# Patient Record
Sex: Male | Born: 1935 | Race: White | Hispanic: No | Marital: Married | Smoking: Former smoker
Health system: Southern US, Community
[De-identification: ages and names within clinical notes are randomized; demographics above are authoritative.]

## PROBLEM LIST (undated history)

## (undated) DIAGNOSIS — M1990 Unspecified osteoarthritis, unspecified site: Secondary | ICD-10-CM

## (undated) DIAGNOSIS — Z9289 Personal history of other medical treatment: Secondary | ICD-10-CM

## (undated) DIAGNOSIS — E7800 Pure hypercholesterolemia, unspecified: Secondary | ICD-10-CM

## (undated) DIAGNOSIS — Z8739 Personal history of other diseases of the musculoskeletal system and connective tissue: Secondary | ICD-10-CM

## (undated) DIAGNOSIS — R001 Bradycardia, unspecified: Secondary | ICD-10-CM

## (undated) DIAGNOSIS — D649 Anemia, unspecified: Secondary | ICD-10-CM

## (undated) DIAGNOSIS — N059 Unspecified nephritic syndrome with unspecified morphologic changes: Secondary | ICD-10-CM

## (undated) DIAGNOSIS — L03119 Cellulitis of unspecified part of limb: Secondary | ICD-10-CM

## (undated) DIAGNOSIS — IMO0002 Reserved for concepts with insufficient information to code with codable children: Secondary | ICD-10-CM

## (undated) DIAGNOSIS — H3530 Unspecified macular degeneration: Secondary | ICD-10-CM

## (undated) DIAGNOSIS — I495 Sick sinus syndrome: Secondary | ICD-10-CM

## (undated) DIAGNOSIS — K219 Gastro-esophageal reflux disease without esophagitis: Secondary | ICD-10-CM

## (undated) DIAGNOSIS — E039 Hypothyroidism, unspecified: Secondary | ICD-10-CM

## (undated) DIAGNOSIS — J189 Pneumonia, unspecified organism: Secondary | ICD-10-CM

## (undated) DIAGNOSIS — K649 Unspecified hemorrhoids: Secondary | ICD-10-CM

## (undated) DIAGNOSIS — I10 Essential (primary) hypertension: Secondary | ICD-10-CM

## (undated) DIAGNOSIS — N200 Calculus of kidney: Secondary | ICD-10-CM

## (undated) DIAGNOSIS — R011 Cardiac murmur, unspecified: Secondary | ICD-10-CM

## (undated) DIAGNOSIS — L02419 Cutaneous abscess of limb, unspecified: Secondary | ICD-10-CM

## (undated) DIAGNOSIS — I499 Cardiac arrhythmia, unspecified: Secondary | ICD-10-CM

## (undated) DIAGNOSIS — Z950 Presence of cardiac pacemaker: Secondary | ICD-10-CM

## (undated) DIAGNOSIS — I509 Heart failure, unspecified: Secondary | ICD-10-CM

## (undated) DIAGNOSIS — I639 Cerebral infarction, unspecified: Secondary | ICD-10-CM

## (undated) DIAGNOSIS — M549 Dorsalgia, unspecified: Secondary | ICD-10-CM

## (undated) HISTORY — DX: Unspecified nephritic syndrome with unspecified morphologic changes: N05.9

## (undated) HISTORY — DX: Sick sinus syndrome: I49.5

## (undated) HISTORY — DX: Reserved for concepts with insufficient information to code with codable children: IMO0002

## (undated) HISTORY — DX: Unspecified hemorrhoids: K64.9

## (undated) HISTORY — PX: REPLACEMENT TOTAL KNEE: SUR1224

## (undated) HISTORY — DX: Bradycardia, unspecified: R00.1

## (undated) HISTORY — PX: CATARACT EXTRACTION W/ INTRAOCULAR LENS  IMPLANT, BILATERAL: SHX1307

## (undated) HISTORY — PX: KIDNEY STONE SURGERY: SHX686

## (undated) HISTORY — PX: INSERT / REPLACE / REMOVE PACEMAKER: SUR710

## (undated) HISTORY — PX: JOINT REPLACEMENT: SHX530

## (undated) HISTORY — PX: CYSTOSCOPY W/ LITHOLAPAXY / EHL: SUR377

## (undated) HISTORY — PX: BACK SURGERY: SHX140

## (undated) HISTORY — PX: TONSILLECTOMY: SUR1361

## (undated) HISTORY — PX: APPENDECTOMY: SHX54

---

## 1998-08-03 ENCOUNTER — Encounter: Payer: Self-pay | Admitting: Family Medicine

## 1998-08-03 ENCOUNTER — Ambulatory Visit (HOSPITAL_COMMUNITY): Admission: RE | Admit: 1998-08-03 | Discharge: 1998-08-03 | Payer: Self-pay | Admitting: Family Medicine

## 1998-08-20 ENCOUNTER — Ambulatory Visit (HOSPITAL_COMMUNITY): Admission: RE | Admit: 1998-08-20 | Discharge: 1998-08-20 | Payer: Self-pay | Admitting: Family Medicine

## 2000-10-18 ENCOUNTER — Ambulatory Visit (HOSPITAL_COMMUNITY): Admission: RE | Admit: 2000-10-18 | Discharge: 2000-10-18 | Payer: Self-pay | Admitting: Family Medicine

## 2000-10-18 ENCOUNTER — Encounter: Payer: Self-pay | Admitting: Family Medicine

## 2000-11-06 ENCOUNTER — Ambulatory Visit (HOSPITAL_COMMUNITY): Admission: RE | Admit: 2000-11-06 | Discharge: 2000-11-06 | Payer: Self-pay | Admitting: Family Medicine

## 2000-11-06 ENCOUNTER — Encounter: Payer: Self-pay | Admitting: Family Medicine

## 2001-03-29 ENCOUNTER — Encounter: Payer: Self-pay | Admitting: Urology

## 2001-03-29 ENCOUNTER — Ambulatory Visit (HOSPITAL_BASED_OUTPATIENT_CLINIC_OR_DEPARTMENT_OTHER): Admission: RE | Admit: 2001-03-29 | Discharge: 2001-03-29 | Payer: Self-pay | Admitting: Urology

## 2001-04-18 ENCOUNTER — Encounter: Payer: Self-pay | Admitting: Family Medicine

## 2001-04-18 ENCOUNTER — Ambulatory Visit (HOSPITAL_COMMUNITY): Admission: RE | Admit: 2001-04-18 | Discharge: 2001-04-18 | Payer: Self-pay | Admitting: Family Medicine

## 2002-04-08 ENCOUNTER — Ambulatory Visit (HOSPITAL_COMMUNITY): Admission: RE | Admit: 2002-04-08 | Discharge: 2002-04-08 | Payer: Self-pay | Admitting: Gastroenterology

## 2011-10-01 ENCOUNTER — Encounter (HOSPITAL_COMMUNITY): Payer: Self-pay | Admitting: *Deleted

## 2011-10-01 ENCOUNTER — Inpatient Hospital Stay (HOSPITAL_COMMUNITY)
Admission: EM | Admit: 2011-10-01 | Discharge: 2011-10-05 | DRG: 243 | Disposition: A | Discharge status: Home / Self Care | Payer: Medicare Other | Attending: Internal Medicine | Admitting: Internal Medicine

## 2011-10-01 DIAGNOSIS — I10 Essential (primary) hypertension: Secondary | ICD-10-CM

## 2011-10-01 DIAGNOSIS — I498 Other specified cardiac arrhythmias: Secondary | ICD-10-CM | POA: Diagnosis present

## 2011-10-01 DIAGNOSIS — E032 Hypothyroidism due to medicaments and other exogenous substances: Secondary | ICD-10-CM

## 2011-10-01 DIAGNOSIS — I350 Nonrheumatic aortic (valve) stenosis: Secondary | ICD-10-CM

## 2011-10-01 DIAGNOSIS — I359 Nonrheumatic aortic valve disorder, unspecified: Secondary | ICD-10-CM | POA: Diagnosis present

## 2011-10-01 DIAGNOSIS — I442 Atrioventricular block, complete: Principal | ICD-10-CM

## 2011-10-01 DIAGNOSIS — N179 Acute kidney failure, unspecified: Secondary | ICD-10-CM

## 2011-10-01 DIAGNOSIS — R55 Syncope and collapse: Secondary | ICD-10-CM

## 2011-10-01 DIAGNOSIS — D649 Anemia, unspecified: Secondary | ICD-10-CM

## 2011-10-01 DIAGNOSIS — Z7982 Long term (current) use of aspirin: Secondary | ICD-10-CM

## 2011-10-01 DIAGNOSIS — E039 Hypothyroidism, unspecified: Secondary | ICD-10-CM | POA: Diagnosis present

## 2011-10-01 HISTORY — DX: Dorsalgia, unspecified: M54.9

## 2011-10-01 HISTORY — DX: Pure hypercholesterolemia, unspecified: E78.00

## 2011-10-01 HISTORY — DX: Calculus of kidney: N20.0

## 2011-10-01 HISTORY — DX: Hypothyroidism, unspecified: E03.9

## 2011-10-01 HISTORY — DX: Essential (primary) hypertension: I10

## 2011-10-01 LAB — CREATININE, SERUM
Creatinine, Ser: 1.51 mg/dL — ABNORMAL HIGH (ref 0.50–1.35)
GFR calc Af Amer: 44 mL/min — ABNORMAL LOW (ref >90)
GFR calc non Af Amer: 38 mL/min — ABNORMAL LOW (ref >90)

## 2011-10-01 LAB — TSH: TSH: 2.164 u[IU]/mL (ref 0.350–4.500)

## 2011-10-01 LAB — CARDIAC PANEL(CRET KIN+CKTOT+MB+TROPI)
CK, MB: 3.7 ng/mL (ref 0.3–4.0)
CK, MB: 3 ng/mL (ref 0.3–4.0)
Troponin I: <0.3 ng/mL (ref <0.30)

## 2011-10-01 LAB — MAGNESIUM: Magnesium: 2.3 mg/dL (ref 1.5–2.5)

## 2011-10-01 LAB — POCT I-STAT, CHEM 8
Creatinine, Ser: 1.6 mg/dL — ABNORMAL HIGH (ref 0.50–1.35)
Glucose, Bld: 124 mg/dL — ABNORMAL HIGH (ref 70–99)
Hemoglobin: 11.9 g/dL — ABNORMAL LOW (ref 13.0–17.0)
Potassium: 4.8 mEq/L (ref 3.5–5.1)

## 2011-10-01 LAB — CBC
HCT: 33.1 % — ABNORMAL LOW (ref 39.0–52.0)
Hemoglobin: 11.1 g/dL — ABNORMAL LOW (ref 13.0–17.0)
MCHC: 33.5 g/dL (ref 30.0–36.0)

## 2011-10-01 MED ORDER — VITAMIN C 250 MG PO TABS
250.0000 mg | ORAL_TABLET | Freq: Every day | ORAL | Status: DC
  Administered 2011-10-02 – 2011-10-05 (×4): 250 mg via ORAL
  Filled 2011-10-01 (×4): qty 1

## 2011-10-01 MED ORDER — ACETAMINOPHEN 650 MG RE SUPP: 650.0000 mg | Freq: Four times a day (QID) | RECTAL | Status: DC | PRN

## 2011-10-01 MED ORDER — ONDANSETRON HCL 4 MG/2ML IJ SOLN: 4.0000 mg | Freq: Four times a day (QID) | INTRAMUSCULAR | Status: DC | PRN

## 2011-10-01 MED ORDER — ACETAMINOPHEN 325 MG PO TABS: 650.0000 mg | ORAL_TABLET | Freq: Four times a day (QID) | ORAL | Status: DC | PRN

## 2011-10-01 MED ORDER — THERA M PLUS PO TABS: 1.0000 | ORAL_TABLET | Freq: Every day | ORAL | Status: DC

## 2011-10-01 MED ORDER — ASPIRIN EC 81 MG PO TBEC
81.0000 mg | DELAYED_RELEASE_TABLET | Freq: Every day | ORAL | Status: DC
  Administered 2011-10-02 – 2011-10-05 (×4): 81 mg via ORAL
  Filled 2011-10-01 (×4): qty 1

## 2011-10-01 MED ORDER — SODIUM CHLORIDE 0.9 % IJ SOLN
3.0000 mL | Freq: Two times a day (BID) | INTRAMUSCULAR | Status: DC
  Administered 2011-10-01 – 2011-10-02 (×2): 3 mL via INTRAVENOUS

## 2011-10-01 MED ORDER — SODIUM CHLORIDE 0.9 % IJ SOLN
3.0000 mL | Freq: Two times a day (BID) | INTRAMUSCULAR | Status: DC
  Administered 2011-10-01 – 2011-10-05 (×7): 3 mL via INTRAVENOUS

## 2011-10-01 MED ORDER — ATROPINE SULFATE 0.1 MG/ML IJ SOLN
0.5000 mg | Freq: Once | INTRAMUSCULAR | Status: AC | PRN
  Filled 2011-10-01: qty 5

## 2011-10-01 MED ORDER — POLYETHYLENE GLYCOL 3350 17 G PO PACK
17.0000 g | PACK | Freq: Every day | ORAL | Status: DC | PRN
  Administered 2011-10-02: 17 g via ORAL
  Filled 2011-10-01 (×2): qty 1

## 2011-10-01 MED ORDER — SODIUM CHLORIDE 0.9 % IJ SOLN: 3.0000 mL | INTRAMUSCULAR | Status: DC | PRN

## 2011-10-01 MED ORDER — SIMVASTATIN 20 MG PO TABS
20.0000 mg | ORAL_TABLET | Freq: Every day | ORAL | Status: DC
  Administered 2011-10-01 – 2011-10-04 (×4): 20 mg via ORAL
  Filled 2011-10-01 (×5): qty 1

## 2011-10-01 MED ORDER — ONDANSETRON HCL 4 MG PO TABS: 4.0000 mg | ORAL_TABLET | Freq: Four times a day (QID) | ORAL | Status: DC | PRN

## 2011-10-01 MED ORDER — SODIUM CHLORIDE 0.9 % IV SOLN: 250.0000 mL | INTRAVENOUS | Status: DC | PRN

## 2011-10-01 MED ORDER — HEPARIN SODIUM (PORCINE) 5000 UNIT/ML IJ SOLN
5000.0000 [IU] | Freq: Three times a day (TID) | INTRAMUSCULAR | Status: DC
  Administered 2011-10-01 – 2011-10-03 (×5): 5000 [IU] via SUBCUTANEOUS
  Filled 2011-10-01 (×8): qty 1

## 2011-10-01 MED ORDER — ADULT MULTIVITAMIN W/MINERALS CH
1.0000 | ORAL_TABLET | Freq: Every day | ORAL | Status: DC
  Administered 2011-10-02 – 2011-10-05 (×4): 1 via ORAL
  Filled 2011-10-01 (×4): qty 1

## 2011-10-01 MED ORDER — LEVOTHYROXINE SODIUM 100 MCG PO TABS
100.0000 ug | ORAL_TABLET | Freq: Every day | ORAL | Status: DC
  Administered 2011-10-02 – 2011-10-05 (×4): 100 ug via ORAL
  Filled 2011-10-01 (×5): qty 1
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
## 2011-10-01 NOTE — Progress Notes (Signed)
10/01/11 1238  Discharge Planning  Type of Residence Private residence  Living Arrangements Spouse/significant other  Home Care Services No  Support Systems Spouse/significant other  Do you have any problems obtaining your medications? No  Family/patient expects to be discharged to: Private residence  Once you are discharged, how will you get to your follow-up appointment? Family  Case Management Consult Needed No  Social Work Consult Needed No    No urgent LCSW interventions identified. Contact unit based LCSW if disposition and/or psychosocial needs arise.    Dionne Milo MSW [facility]. Weekend/Social Worker [phone]

## 2011-10-01 NOTE — ED Notes (Signed)
Pt. States he was in the car with his wife when he "felt out of it. It felt like I had birds flying around my head".  Denies LOC. Pt. Reports vomiting twice afterward. States he feels fine now. States similar episode happened about 3 weeks ago. Pt. Had chest xray and cardiogram at the beginning of this week for heart murmur.

## 2011-10-01 NOTE — ED Notes (Signed)
MD at bedside. 
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
## 2011-10-01 NOTE — ED Notes (Addendum)
[place]in[place] RN talked with Dr. Elisha Headland from [facility] about pt.'s care. Report given to this nurse.

## 2011-10-01 NOTE — ED Notes (Signed)
Reports having syncopal episode this am, followed by n/v. HR 40 at triage, ekg done.

## 2011-10-01 NOTE — ED Notes (Signed)
Updated pt and family on plan of care.
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
## 2011-10-02 DIAGNOSIS — E032 Hypothyroidism due to medicaments and other exogenous substances: Secondary | ICD-10-CM

## 2011-10-02 DIAGNOSIS — I10 Essential (primary) hypertension: Secondary | ICD-10-CM

## 2011-10-02 DIAGNOSIS — R55 Syncope and collapse: Secondary | ICD-10-CM

## 2011-10-02 LAB — CARDIAC PANEL(CRET KIN+CKTOT+MB+TROPI)
CK, MB: 2.8 ng/mL (ref 0.3–4.0)
Relative Index: INVALID (ref 0.0–2.5)
Troponin I: <0.3 ng/mL (ref <0.30)

## 2011-10-02 LAB — COMPREHENSIVE METABOLIC PANEL
ALT: 30 U/L (ref 0–53)
AST: 28 U/L (ref 0–37)
Albumin: 3.7 g/dL (ref 3.5–5.2)
CO2: 24 mEq/L (ref 19–32)
Calcium: 9.6 mg/dL (ref 8.4–10.5)
Chloride: 106 mEq/L (ref 96–112)
Creatinine, Ser: 1.38 mg/dL — ABNORMAL HIGH (ref 0.50–1.35)
GFR calc non Af Amer: 42 mL/min — ABNORMAL LOW (ref >90)
Sodium: 141 mEq/L (ref 135–145)
Total Bilirubin: 0.8 mg/dL (ref 0.3–1.2)

## 2011-10-02 LAB — VITAMIN B12: Vitamin B-12: 658 pg/mL (ref 211–911)

## 2011-10-02 LAB — RETICULOCYTES
RBC.: 3.23 MIL/uL — ABNORMAL LOW (ref 4.22–5.81)
Retic Count, Absolute: 32.3 10*3/uL (ref 19.0–186.0)

## 2011-10-02 LAB — IRON AND TIBC: Iron: 90 ug/dL (ref 42–135)

## 2011-10-02 LAB — CBC
Hemoglobin: 11.3 g/dL — ABNORMAL LOW (ref 13.0–17.0)
MCH: 35 pg — ABNORMAL HIGH (ref 26.0–34.0)
MCHC: 33.6 g/dL (ref 30.0–36.0)
MCV: 104 fL — ABNORMAL HIGH (ref 78.0–100.0)
RBC: 3.23 MIL/uL — ABNORMAL LOW (ref 4.22–5.81)

## 2011-10-02 LAB — FOLATE: Folate: >20 ng/mL

## 2011-10-02 MED ORDER — BISACODYL 5 MG PO TBEC
5.0000 mg | DELAYED_RELEASE_TABLET | Freq: Every day | ORAL | Status: DC | PRN
  Administered 2011-10-04: 5 mg via ORAL
  Filled 2011-10-02: qty 1

## 2011-10-02 NOTE — Progress Notes (Signed)
[name]  John Weaver AVW:[id number] DOB: Jan 28, 1916 DOA: 10/01/2011   Assessment/Plan: Patient Active Hospital Problem List: Syncope and collapse (10/01/2011)  -no evidence on telemetry, cardiac enzymes continue to be negative. TSH is within normal limits. Cardiology has recommended an evaluation by EP as he has significant EKG changes. -He's currently on no rate control medications.   Bradycardia (10/01/2011)  resolved. See above.  Hypothyroidism (10/01/2011)  TSH is within normal limits continue current dose.  HTN (hypertension) (10/01/2011)  well controlled.  Anemia (10/02/2011) -he relates he drinks about 4 glasses of wine a week and anemia panel has been ordered. As his MCV is 104 and his hemoglobin is 11.3, he had a recent colonoscopy 3 years ago by Dr. Lamount Cranker as per patient it was within normal limits.   Family Communication: Patient and family Disposition Plan: Inpatient  Lambert Keto, MD  [facility] Pager [phone]  If 7PM-7AM, please contact night-coverage [email or web address] Password [email or web address] 10/02/2011, 10:32 AM   LOS: 1 day   Procedures:  2-D echo pending  Antibiotics: None  Subjective: Patient has no complaints at this time, he has no dizziness nausea or vomiting.  Objective: Filed Vitals:   10/01/11 1245 10/01/11 1544 10/01/11 2100 10/02/11 0500  BP: 158/71  131/67 139/71  Pulse: 77  66 67  Temp:   97.6 F (36.4 C) 97.6 F (36.4 C)  TempSrc:   Oral Oral  Resp: 18  18 18   Height:  5\' 4"  (1.626 m)    Weight:  57.607 kg (127 lb)    SpO2: 100%  98% 95%    Intake/Output Summary (Last 24 hours) at 10/02/11 1032 Last data filed at 10/02/11 1021  Gross per 24 hour  Intake      9 ml  Output      0 ml  Net      9 ml   Weight change:   Exam:  General: Alert, awake, oriented x3, in no acute distress.  HEENT: No bruits, no goiter.  Heart: Regular rate and rhythm, without murmurs, rubs, gallops.  Lungs: Good air  movement clear to auscultation. Abdomen: Soft, nontender, nondistended, positive bowel sounds.  Neuro: Grossly intact, nonfocal.   Data Reviewed: Basic Metabolic Panel:  Lab 10/02/11 3086 10/01/11 1555 10/01/11 1147  NA 141 -- 141  K 4.8 -- 4.8  CL 106 -- 108  CO2 24 -- --  GLUCOSE 131* -- 124*  BUN 32* -- 45*  CREATININE 1.38* 1.51* 1.60*  CALCIUM 9.6 -- --  MG -- 2.3 --  PHOS -- -- --   Liver Function Tests:  Lab 10/02/11 0816  AST 28  ALT 30  ALKPHOS 74  BILITOT 0.8  PROT 6.5  ALBUMIN 3.7   No results found for this basename: LIPASE:5,AMYLASE:5 in the last 168 hours No results found for this basename: AMMONIA:5 in the last 168 hours CBC:  Lab 10/02/11 0816 10/01/11 1147 10/01/11 1118  WBC 8.5 -- 8.3  NEUTROABS -- -- --  HGB 11.3* 11.9* 11.1*  HCT 33.6* 35.0* 33.1*  MCV 104.0* -- 102.5*  PLT 203 -- 185   Cardiac Enzymes:  Lab 10/02/11 0820 10/01/11 2304 10/01/11 1555  CKTOTAL 47 48 63  CKMB 2.8 3.0 3.7  CKMBINDEX -- -- --  TROPONINI <0.30 <0.30 <0.30   BNP: No components found with this basename: POCBNP:5 CBG: No results found for this basename: GLUCAP:5 in the last 168 hours  No results found for this or any previous visit (from the  past 240 hour(s)).   Studies: No results found.  Scheduled Meds:   . aspirin EC  81 mg Oral Daily  . heparin  5,000 Units Subcutaneous Q8H  . levothyroxine  100 mcg Oral QAC breakfast  . multivitamin with minerals  1 tablet Oral Daily  . simvastatin  20 mg Oral q1800  . sodium chloride  3 mL Intravenous Q12H  . sodium chloride  3 mL Intravenous Q12H  . vitamin C  250 mg Oral Daily  . DISCONTD: multivitamins ther. w/minerals  1 tablet Oral Daily   Continuous Infusions:

## 2011-10-02 NOTE — Evaluation (Signed)
Physical Therapy Evaluation Patient Details Name: John Weaver MRN: [id number] DOB: May 22, 1915 Today's Date: 10/02/2011 Time: 1191-4782 PT Time Calculation (min): 21 min  PT Assessment / Plan / Recommendation Clinical Impression  Pt is a 78 y/o male admitted s/p syncope due to possible bradycardia.  Pt is modified independent with all mobility except ambulation requiring only supervision due to slight premorbid unsteadiness.  HR stable throughout entire session starting at 68 BPM and increasing appropriately with exercise to 84 BPM.  D/w pt and wife recommendation of follow-up with Outpatient PT for balance.  Signing off acutely.  Thanks.    PT Assessment  All further PT needs can be met in the next venue of care    Follow Up Recommendations  Outpatient PT    Barriers to Discharge        Equipment Recommendations  None recommended by PT    Recommendations for Other Services     Frequency      Precautions / Restrictions Precautions Precautions: None Restrictions Weight Bearing Restrictions: No   Pertinent Vitals/Pain n/a      Mobility  Bed Mobility Bed Mobility: Supine to Sit;Sit to Supine Supine to Sit: 6: Modified independent (Device/Increase time) Sit to Supine: 6: Modified independent (Device/Increase time) Transfers Transfers: Sit to Stand;Stand to Sit Sit to Stand: 6: Modified independent (Device/Increase time);With upper extremity assist;From bed Stand to Sit: 6: Modified independent (Device/Increase time);With upper extremity assist;To bed Ambulation/Gait Ambulation/Gait Assistance: 5: Supervision Ambulation Distance (Feet): 400 Feet Assistive device: None Ambulation/Gait Assistance Details: Slight imbalance noted with gait with decreased right LE stance time resulting in slight decreased balance.  Wife notes this as baseline steadiness.  Recommended RW trial, which pt denied.  Pt and wife report that pt will occassional use a cane, but prefers no AD.   Gait  Pattern: Step-through pattern;Decreased stance time - right;Decreased step length - left Stairs: No Wheelchair Mobility Wheelchair Mobility: No    Exercises     PT Diagnosis: Abnormality of gait  PT Problem List: Decreased balance PT Treatment Interventions:     PT Goals    Visit Information  Last PT Received On: 10/02/11 Assistance Needed: +1    Subjective Data  Subjective: "That sounds perfect." Patient Stated Goal: Go home.   Prior Functioning  Home Living Lives With: Spouse Available Help at Discharge: Family Type of Home: Apartment (Also has 1 story house in [place].) Home Access: Elevator Home Layout: One level Bathroom Shower/Tub: Walk-in shower;Door [employer] Toilet: Standard Home Adaptive Equipment: Journalist, newspaper Prior Function Level of Independence: Independent Able to Take Stairs?: Yes Driving: Yes Vocation: Retired Musician: No difficulties    Cognition  Overall Cognitive Status: Appears within functional limits for tasks assessed/performed Arousal/Alertness: Awake/alert Orientation Level: Appears intact for tasks assessed Behavior During Session: [facility] for tasks performed    Extremity/Trunk Assessment Right Upper Extremity Assessment RUE ROM/Strength/Tone: Within functional levels RUE Sensation: WFL - Light Touch RUE Coordination: WFL - gross/fine motor Left Upper Extremity Assessment LUE ROM/Strength/Tone: Within functional levels LUE Sensation: WFL - Light Touch LUE Coordination: WFL - gross/fine motor Right Lower Extremity Assessment RLE ROM/Strength/Tone: Within functional levels RLE Sensation: WFL - Light Touch RLE Coordination: WFL - gross/fine motor Left Lower Extremity Assessment LLE ROM/Strength/Tone: Within functional levels LLE Sensation: WFL - Light Touch LLE Coordination: WFL - gross/fine motor Trunk Assessment Trunk Assessment: Normal   Balance Balance Balance Assessed: No  End of Session PT - End  of Session Equipment Utilized During Treatment: Gait belt Activity  Tolerance: Patient tolerated treatment well Patient left: in bed;with call bell/phone within reach;with family/visitor present Nurse Communication: Mobility status  GP     John Weaver 10/02/2011, 12:25 PM  10/02/2011 John Weaver, PT, DPT [phone]

## 2011-10-02 NOTE — Progress Notes (Signed)
  Echocardiogram 2D Echocardiogram has been performed.  John Weaver FRANCES 10/02/2011, 5:52 PM

## 2011-10-02 NOTE — Progress Notes (Addendum)
SUBJECTIVE:  Doing well, no more dizziness  OBJECTIVE:   Vitals:   Filed Vitals:   10/01/11 1245 10/01/11 1544 10/01/11 2100 10/02/11 0500  BP: 158/71  131/67 139/71  Pulse: 77  66 67  Temp:   97.6 F (36.4 C) 97.6 F (36.4 C)  TempSrc:   Oral Oral  Resp: 18  18 18   Height:  5\' 4"  (1.626 m)    Weight:  57.607 kg (127 lb)    SpO2: 100%  98% 95%   I&O's:   Intake/Output Summary (Last 24 hours) at 10/02/11 0934 Last data filed at 10/01/11 1725  Gross per 24 hour  Intake      6 ml  Output      0 ml  Net      6 ml   TELEMETRY: Reviewed telemetry pt in NSR     PHYSICAL EXAM General: Well developed, well nourished, in no acute distress  Lungs:   Clear bilaterally to auscultation and percussion. Heart:   HRRR S1 S2 Pulses are 2+ & equal. Abdomen: Bowel sounds are positive, abdomen soft and non-tender without masses  Extremities:   No clubbing, cyanosis or edema.  DP +1 Neuro: Alert and oriented X 3. Psych:  Good affect, responds appropriately   LABS: Basic Metabolic Panel:  Basename 10/01/11 1555 10/01/11 1147  NA -- 141  K -- 4.8  CL -- 108  CO2 -- --  GLUCOSE -- 124*  BUN -- 45*  CREATININE 1.51* 1.60*  CALCIUM -- --  MG 2.3 --  PHOS -- --   Liver Function Tests: No results found for this basename: AST:2,ALT:2,ALKPHOS:2,BILITOT:2,PROT:2,ALBUMIN:2 in the last 72 hours No results found for this basename: LIPASE:2,AMYLASE:2 in the last 72 hours CBC:  Basename 10/02/11 0816 10/01/11 1147 10/01/11 1118  WBC 8.5 -- 8.3  NEUTROABS -- -- --  HGB 11.3* 11.9* --  HCT 33.6* 35.0* --  MCV 104.0* -- 102.5*  PLT 203 -- 185   Cardiac Enzymes:  Basename 10/01/11 2304 10/01/11 1555  CKTOTAL 48 63  CKMB 3.0 3.7  CKMBINDEX -- --  TROPONINI <0.30 <0.30   Thyroid Function Tests:  Basename 10/01/11 1555  TSH 2.164  T4TOTAL --  T3FREE --  THYROIDAB --   Anemia Panel:  Basename 10/02/11 0816  VITAMINB12 --  FOLATE --  FERRITIN --  TIBC --  IRON --    RETICCTPCT 1.0   Coag Panel:   Lab Results  Component Value Date   INR 1.11 10/02/2011    RADIOLOGY: No results found.    ASSESSMENT:  1. Presyncope vs.Syncope - patient is unsure whether he actually passed out but did have a very abnormal feeling in his head along with nausea and vomiting. Most likely due to bradycarrhythmias given documented bradycardia to 30bpm in ER. He also has evidence of conduction system disease with RBBB and 1st degree AV block. Rhythms strips from ER in patient chart.  2. RBBB  3. HTN  4. Hypothyroidism  5. Heart murmur with recent echo in office showing moderate AS with normal LVF   PLAN:   1.  EP has been consulted for consideration of PPM - await input from them tomorrow  Quintella Reichert, MD  10/02/2011  9:34 AM

## 2011-10-03 ENCOUNTER — Encounter: Payer: Self-pay | Admitting: *Deleted

## 2011-10-03 ENCOUNTER — Encounter (HOSPITAL_COMMUNITY)
Admission: EM | Disposition: A | Discharge status: Home / Self Care | Payer: Self-pay | Source: Home / Self Care | Attending: Internal Medicine

## 2011-10-03 DIAGNOSIS — I350 Nonrheumatic aortic (valve) stenosis: Secondary | ICD-10-CM

## 2011-10-03 DIAGNOSIS — R55 Syncope and collapse: Secondary | ICD-10-CM

## 2011-10-03 DIAGNOSIS — D649 Anemia, unspecified: Secondary | ICD-10-CM

## 2011-10-03 DIAGNOSIS — I442 Atrioventricular block, complete: Principal | ICD-10-CM

## 2011-10-03 DIAGNOSIS — Z950 Presence of cardiac pacemaker: Secondary | ICD-10-CM | POA: Insufficient documentation

## 2011-10-03 DIAGNOSIS — N179 Acute kidney failure, unspecified: Secondary | ICD-10-CM

## 2011-10-03 HISTORY — PX: TEMPORARY PACEMAKER INSERTION: SHX5471

## 2011-10-03 HISTORY — PX: PERMANENT PACEMAKER INSERTION: SHX5480

## 2011-10-03 LAB — MRSA PCR SCREENING: MRSA by PCR: NEGATIVE

## 2011-10-03 SURGERY — TEMPORARY PACEMAKER INSERTION
Anesthesia: LOCAL

## 2011-10-03 SURGERY — PERMANENT PACEMAKER INSERTION
Anesthesia: LOCAL

## 2011-10-03 MED ORDER — CEFAZOLIN SODIUM 1-5 GM-% IV SOLN
1.0000 g | INTRAVENOUS | Status: DC
  Filled 2011-10-03: qty 50

## 2011-10-03 MED ORDER — SODIUM CHLORIDE 0.9 % IR SOLN
80.0000 mg | Status: DC
  Filled 2011-10-03: qty 2

## 2011-10-03 MED ORDER — CEFAZOLIN SODIUM 1-5 GM-% IV SOLN
1.0000 g | Freq: Four times a day (QID) | INTRAVENOUS | Status: AC
  Administered 2011-10-03 – 2011-10-04 (×3): 1 g via INTRAVENOUS
  Filled 2011-10-03 (×3): qty 50

## 2011-10-03 MED ORDER — CEFAZOLIN SODIUM 1-5 GM-% IV SOLN
INTRAVENOUS | Status: AC
  Filled 2011-10-03: qty 100

## 2011-10-03 MED ORDER — ONDANSETRON HCL 4 MG/2ML IJ SOLN: 4.0000 mg | Freq: Four times a day (QID) | INTRAMUSCULAR | Status: DC | PRN

## 2011-10-03 MED ORDER — FENTANYL CITRATE 0.05 MG/ML IJ SOLN
INTRAMUSCULAR | Status: AC
  Filled 2011-10-03: qty 2

## 2011-10-03 MED ORDER — SODIUM CHLORIDE 0.9 % IV SOLN
INTRAVENOUS | Status: AC
  Administered 2011-10-03: 50 mL/h via INTRAVENOUS

## 2011-10-03 MED ORDER — LIDOCAINE HCL (PF) 1 % IJ SOLN
INTRAMUSCULAR | Status: AC
  Filled 2011-10-03: qty 30

## 2011-10-03 MED ORDER — LIDOCAINE HCL (PF) 1 % IJ SOLN
INTRAMUSCULAR | Status: AC
  Filled 2011-10-03: qty 60

## 2011-10-03 MED ORDER — HEPARIN (PORCINE) IN NACL 2-0.9 UNIT/ML-% IJ SOLN
INTRAMUSCULAR | Status: AC
  Filled 2011-10-03: qty 1000

## 2011-10-03 MED ORDER — NITROGLYCERIN 0.2 MG/ML ON CALL CATH LAB
INTRAVENOUS | Status: AC
  Filled 2011-10-03: qty 1

## 2011-10-03 MED ORDER — ACETAMINOPHEN 325 MG PO TABS
325.0000 mg | ORAL_TABLET | ORAL | Status: DC | PRN
  Administered 2011-10-03 – 2011-10-04 (×3): 650 mg via ORAL
  Filled 2011-10-03 (×4): qty 2

## 2011-10-03 MED ORDER — MIDAZOLAM HCL 5 MG/5ML IJ SOLN
INTRAMUSCULAR | Status: AC
  Filled 2011-10-03: qty 5

## 2011-10-03 NOTE — Interval H&P Note (Signed)
History and Physical Interval Note:  10/03/2011 8:52 AM  John Weaver  has presented today for surgery, with the diagnosis of temp wire  The various methods of treatment have been discussed with the patient and family. After consideration of risks, benefits and other options for treatment, the patient has consented to  Procedure(s) (LRB): TEMPORARY PACEMAKER INSERTION (N/A) as a surgical intervention .  The patient's history has been reviewed, patient examined, no change in status, stable for surgery.  I have reviewed the patients' chart and labs.  Questions were answered to the patient's satisfaction.     Shaleka Brines R

## 2011-10-03 NOTE — Progress Notes (Signed)
Orthopedic Tech Progress Note Patient Details:  John Weaver [birth year] [id number]  Ortho Devices Type of Ortho Device: Arm foam sling Ortho Device/Splint Location: left arm Ortho Device/Splint Interventions: Application   John Weaver 10/03/2011, 1:55 PM

## 2011-10-03 NOTE — Care Management Note (Addendum)
    Page 1 of 1   10/05/2011     2:24:05 PM   CARE MANAGEMENT NOTE 10/05/2011  Patient:  John Weaver, John Weaver   Account Number:  [id number]  Date Initiated:  10/03/2011  Documentation initiated by:  Junius Creamer  Subjective/Objective Assessment:   adm w syncope and brady     Action/Plan:   lives w fam, was seen by sw-plans to return home at disch   Anticipated DC Date:  10/05/2011   Anticipated DC Plan:  HOME/SELF CARE  In-house referral  Clinical Social Worker      DC Planning Services  CM consult      Choice offered to / List presented to:             Status of service:   Medicare Important Message given?   (If response is "NO", the following Medicare IM given date fields will be blank) Date Medicare IM given:   Date Additional Medicare IM given:    Discharge Disposition:  HOME/SELF CARE  Per UR Regulation:  Reviewed for med. necessity/level of care/duration of stay  If discussed at Long Length of Stay Meetings, dates discussed:    Comments:  7/3 debbie Bauer Ausborn rn,bsn pt for outpt card rehab. pt will have to pay since did not have md. alerted pt and wife. they were given card rehab Number and asked card rehab to follow up w pt to set up appt.  7/1 13:10 debbie Leone Putman rn,bsn [phone]

## 2011-10-03 NOTE — Progress Notes (Signed)
Per chart review, ED CSW assessed patient and did not identify any csw needs. Pt plans to discharge home with out patient physical therapy. No further csw needs at this time. Please reconsult if csw needs arise. Signing off.   Catha Gosselin, Theresia Majors  [phone] .10/03/2011 10:18am

## 2011-10-03 NOTE — Progress Notes (Signed)
Per night shift RN, pt had pauses last night when he would get up to have BM, pt told to stay in bed; Per Dr.Turner this AM, said ok for pt to get up to [facility] and have BM;  pt got up to [facility] and had 14 sec pause;per pts wife, pt had shaking like activity when he got to the [facility]; when RN arrived in pts room, pt A&O, VS obatined, pt put back in bed; Dr.Klein on the floor and made aware; pt going down to cath lab for emergent pacemaker placement, pt transported with zoll pads down to cathlab, pt remained in stable condition.

## 2011-10-03 NOTE — Progress Notes (Signed)
SUBJECTIVE:  Had several pauses last PM.  Once was 5 seconds long associated with a "pressure" in his head  OBJECTIVE:   Vitals:   Filed Vitals:   10/02/11 1128 10/02/11 1132 10/02/11 1414 10/02/11 2100  BP: 166/82 169/79 150/78 163/76  Pulse: 77 72 77 82  Temp:    97.7 F (36.5 C)  TempSrc:    Oral  Resp:    18  Height:      Weight:      SpO2:    98%   I&O's:   Intake/Output Summary (Last 24 hours) at 10/03/11 0717 Last data filed at 10/02/11 1021  Gross per 24 hour  Intake      3 ml  Output      0 ml  Net      3 ml   TELEMETRY: Reviewed telemetry pt in NSR with occasional pauses     PHYSICAL EXAM General: Well developed, well nourished, in no acute distress  Lungs:   Clear bilaterally to auscultation and percussion. Heart:   HRRR S1 S2 Pulses are 2+ & equal.  2/6 SM at RUSB to LLSB Abdomen: Bowel sounds are positive, abdomen soft and non-tender without masses  Extremities:   No clubbing, cyanosis or edema.  DP +1 Neuro: Alert and oriented X 3. Psych:  Good affect, responds appropriately   LABS: Basic Metabolic Panel:  Basename 10/02/11 0816 10/01/11 1555 10/01/11 1147  NA 141 -- 141  K 4.8 -- 4.8  CL 106 -- 108  CO2 24 -- --  GLUCOSE 131* -- 124*  BUN 32* -- 45*  CREATININE 1.38* 1.51* --  CALCIUM 9.6 -- --  MG -- 2.3 --  PHOS -- -- --   Liver Function Tests:  Basename 10/02/11 0816  AST 28  ALT 30  ALKPHOS 74  BILITOT 0.8  PROT 6.5  ALBUMIN 3.7   No results found for this basename: LIPASE:2,AMYLASE:2 in the last 72 hours CBC:  Basename 10/02/11 0816 10/01/11 1147 10/01/11 1118  WBC 8.5 -- 8.3  NEUTROABS -- -- --  HGB 11.3* 11.9* --  HCT 33.6* 35.0* --  MCV 104.0* -- 102.5*  PLT 203 -- 185   Cardiac Enzymes:  Basename 10/02/11 0820 10/01/11 2304 10/01/11 1555  CKTOTAL 47 48 63  CKMB 2.8 3.0 3.7  CKMBINDEX -- -- --  TROPONINI <0.30 <0.30 <0.30    Basename 10/01/11 1555  TSH 2.164  T4TOTAL --  T3FREE --  THYROIDAB --   Anemia  Panel:  Basename 10/02/11 0816  VITAMINB12 658  FOLATE >20.0  FERRITIN 313  TIBC 257  IRON 90  RETICCTPCT 1.0   Coag Panel:   Lab Results  Component Value Date   INR 1.11 10/02/2011    RADIOLOGY: No results found.    ASSESSMENT:  1. Presyncope vs.Syncope - patient is unsure whether he actually passed out but did have a very abnormal feeling in his head along with nausea and vomiting. Most likely due to bradycarrhythmias given documented bradycardia to 30bpm in ER. He also has evidence of conduction system disease with RBBB and 1st degree AV block. Rhythms strips from ER in patient chart.  2. RBBB  3. HTN  4. Hypothyroidism  5. Heart murmur with recent echo in office showing moderate AS with normal LVF  PLAN:  1. EP has been consulted for consideration of PPM - Dr. Klein will see today     Suraj Ramdass R, MD  10/03/2011  7:17 AM   

## 2011-10-03 NOTE — CV Procedure (Signed)
Preop DX::intermittent complete heart block Post op DX:: same  Procedure  dual pacemaker implantation  After routine prep and drape, lidocaine was infiltrated in the prepectoral subclavicular region on the left side an incision was made and carried down to later the prepectoral fascia using electrocautery and sharp dissection a pocket was formed similarly. Hemostasis was obtained.  After this, we turned our attention to gaining accessm to the extrathoracic,left subclavian vein. This was accomplished without difficulty and without the aspiration of air or puncture of the artery. 2 separate venipunctures were accomplished; guidewires were placed and retained and sequentially 7 French sheath through which were  passed an [facility] [id number] ventricular lead serial number and an [employer] atrial lead serial number [id number] .  The ventricular lead was manipulated to the right ventricular apex with a bipolar R wave was6.5, the pacing impedance was622, the threshold was 0.7 @ 0.4 msec  Current at threshold was   1.0 mq  and the current of injury was brisk.  The right atrial lead was manipulated to the right atrial appendage with a bipolar P-wave  1.5, the pacing impedance was , the threshold1/0@ 0.4 msec   Current at threshold was1.5 and the current of injury was brisk.  The ventricular lead was marked with a tie prior to the insertion of the atrial lead. The leads were affixed to the prepectoral fascia and attached to a [employer] Jude pulse generator serial number [id number].  Hemostasis was obtained. The pocket was copiously irrigated with antibiotic containing saline solution. The leads and the pulse generator were placed in the pocket and affixed to the prepectoral fascia. The wound is then closed in 3 layers in normal fashion. The wound is washed dried and a benzoin Steri-Strip this was applied the account sponge counts and instrument counts were correct at the end of the procedure .Marland Kitchen The patient tolerated the  procedure without apparent complication.  Temp pacer removed under flouro Gerlene Burdock.D.

## 2011-10-03 NOTE — CV Procedure (Signed)
Procedure Note:  Procedure:  Placement of temporary pacing wire in RV via   right femoral vein. Operator:  Armanda Magic MD Complications:  None Indications:  Complete heart block  The patient was brought to the Cath lab emergently for temporary pacing wire due to complete heart block.  The patient was prepped and draped in a sterile fashion.  1% xylocaine was used for local anesthesia.  Using the modified Seldinger approach a 83F sheath was placed in the right femoral vein.  Under flouroscopic guidance and temporary pacing wire was placed under balloon inflation in the RV apex.  The balloon was deflated.  The pacing threshold was 4mA at a pacing rate of 110bpm.  The output was set at 7mA and a backup rate of 50bpm.  The patient tolerated the procedure well and will be transferred to the Holding room awaiting placement of permanent pacemaker by Dr. Graciela Husbands shortly.

## 2011-10-03 NOTE — Progress Notes (Signed)
Right venous sheath removed. Held for 15 minutes starting at 1810. No hematoma noted. Gauze and pressure dressing applied. Pt stable. Pt and wife instructed about post sheath removal instructions. Pt verbalized understanding.

## 2011-10-03 NOTE — H&P (View-Only) (Signed)
SUBJECTIVE:  Had several pauses last PM.  Once was 5 seconds long associated with a "pressure" in his head  OBJECTIVE:   Vitals:   Filed Vitals:   10/02/11 1128 10/02/11 1132 10/02/11 1414 10/02/11 2100  BP: 166/82 169/79 150/78 163/76  Pulse: 77 72 77 82  Temp:    97.7 F (36.5 C)  TempSrc:    Oral  Resp:    18  Height:      Weight:      SpO2:    98%   I&O's:   Intake/Output Summary (Last 24 hours) at 10/03/11 0717 Last data filed at 10/02/11 1021  Gross per 24 hour  Intake      3 ml  Output      0 ml  Net      3 ml   TELEMETRY: Reviewed telemetry pt in NSR with occasional pauses     PHYSICAL EXAM General: Well developed, well nourished, in no acute distress  Lungs:   Clear bilaterally to auscultation and percussion. Heart:   HRRR S1 S2 Pulses are 2+ & equal.  2/6 SM at RUSB to LLSB Abdomen: Bowel sounds are positive, abdomen soft and non-tender without masses  Extremities:   No clubbing, cyanosis or edema.  DP +1 Neuro: Alert and oriented X 3. Psych:  Good affect, responds appropriately   LABS: Basic Metabolic Panel:  Basename 10/02/11 0816 10/01/11 1555 10/01/11 1147  NA 141 -- 141  K 4.8 -- 4.8  CL 106 -- 108  CO2 24 -- --  GLUCOSE 131* -- 124*  BUN 32* -- 45*  CREATININE 1.38* 1.51* --  CALCIUM 9.6 -- --  MG -- 2.3 --  PHOS -- -- --   Liver Function Tests:  [facility] 10/02/11 0816  AST 28  ALT 30  ALKPHOS 74  BILITOT 0.8  PROT 6.5  ALBUMIN 3.7   No results found for this basename: LIPASE:2,AMYLASE:2 in the last 72 hours CBC:  Basename 10/02/11 0816 10/01/11 1147 10/01/11 1118  WBC 8.5 -- 8.3  NEUTROABS -- -- --  HGB 11.3* 11.9* --  HCT 33.6* 35.0* --  MCV 104.0* -- 102.5*  PLT 203 -- 185   Cardiac Enzymes:  Basename 10/02/11 0820 10/01/11 2304 10/01/11 1555  CKTOTAL 47 48 63  CKMB 2.8 3.0 3.7  CKMBINDEX -- -- --  TROPONINI <0.30 <0.30 <0.30    Basename 10/01/11 1555  TSH 2.164  T4TOTAL --  T3FREE --  THYROIDAB --   Anemia  Panel:  Basename 10/02/11 0816  VITAMINB12 658  FOLATE >20.0  FERRITIN 313  TIBC 257  IRON 90  RETICCTPCT 1.0   Coag Panel:   Lab Results  Component Value Date   INR 1.11 10/02/2011    RADIOLOGY: No results found.    ASSESSMENT:  1. Presyncope vs.Syncope - patient is unsure whether he actually passed out but did have a very abnormal feeling in his head along with nausea and vomiting. Most likely due to bradycarrhythmias given documented bradycardia to 30bpm in ER. He also has evidence of conduction system disease with RBBB and 1st degree AV block. Rhythms strips from ER in patient chart.  2. RBBB  3. HTN  4. Hypothyroidism  5. Heart murmur with recent echo in office showing moderate AS with normal LVF  PLAN:  1. EP has been consulted for consideration of PPM - Dr. Graciela Husbands will see today     Quintella Reichert, MD  10/03/2011  7:17 AM

## 2011-10-03 NOTE — Progress Notes (Signed)
Notified by monitor tech that the pt. had a 5.17 second pause on telemetry. Patient was currently asymptomatic but stated that he had "pressure" in his head that was intermittent. Patient then got up to try and have a bowel movement then experienced a 4.27 second pause. No pressure in his head noted at this time. Earlier in the night he experienced a 7. 17 second pause that was thought to be lead placement. Strips posted to the chart. Vital signs were stable. BP 150/70, HR 69. Notified Dr. Mayford Knife of the pauses.New orders to keep the patient in bed. Will continue to monitor. Earnest Conroy RN

## 2011-10-03 NOTE — Consult Note (Signed)
ELECTROPHYSIOLGY CONSULT NOTE  Patient ID: Jahmez Bily, MRN: [id number], DOB/AGE: Jun 29, 1915 78 y.o. Admit date: 10/01/2011 Date of Consult: 10/03/2011  Primary Physician: Lolita Patella, MD Primary Cardiologist: TT  Chief Complaint: Kathrynn Ducking   HPI Glendal Cassaday is a 78 y.o. male : admitted fro recurrent syncope in the setting of RBBB and 1AVB who because of recurrent events over the last few weeks admitted to hospital. He has had documented complete heart block with pauses of up to 7 seconds.  He has a history of aortic stenosis identified by echo last week at [facility]. It is moderate. He has normal left ventricular function. He has a history of hypertension. He has not had any atrial fibrillation.  He is quite active. He swims twice a week. He drives independently and continues to paint daily.  Past Medical History  Diagnosis Date  . Back pain   . Kidney stones   . Hypertension   . Hypothyroidism   . Hypercholesteremia       Surgical History:  Past Surgical History  Procedure Date  . Knee surgery     left knee  . Joint replacement     Left knee replacement     Home Meds: Prior to Admission medications   Medication Sig Start Date End Date Taking? Authorizing Provider  Ascorbic Acid (VITAMIN C PO) Take 1 tablet by mouth daily.   Yes Historical Provider, MD  aspirin EC 81 MG tablet Take 81 mg by mouth daily.   Yes Historical Provider, MD  Diphenhydramine-APAP, sleep, (TYLENOL PM EXTRA STRENGTH PO) Take 2 tablets by mouth at bedtime as needed. For sleep   Yes Historical Provider, MD  levothyroxine (SYNTHROID, LEVOTHROID) 100 MCG tablet Take 100 mcg by mouth daily.   Yes Historical Provider, MD  lisinopril (PRINIVIL,ZESTRIL) 20 MG tablet Take 10 mg by mouth 2 (two) times daily.   Yes Historical Provider, MD  Multiple Vitamins-Minerals (MULTIVITAMINS THER. W/MINERALS) TABS Take 1 tablet by mouth daily.   Yes Historical Provider, MD  pravastatin (PRAVACHOL) 40 MG  tablet Take 40 mg by mouth daily.   Yes Historical Provider, MD    Inpatient Medications:     . aspirin EC  81 mg Oral Daily  . heparin  5,000 Units Subcutaneous Q8H  . levothyroxine  100 mcg Oral QAC breakfast  . multivitamin with minerals  1 tablet Oral Daily  . simvastatin  20 mg Oral q1800  . sodium chloride  3 mL Intravenous Q12H  . sodium chloride  3 mL Intravenous Q12H  . vitamin C  250 mg Oral Daily    Allergies:  Allergies  Allergen Reactions  . Sulfa Antibiotics Rash    History   Social History  . Marital Status: Married    Spouse Name: N/A    Number of Children: N/A  . Years of Education: N/A   Occupational History  . Not on file.   Social History Main Topics  . Smoking status: Never Smoker   . Smokeless tobacco: Not on file  . Alcohol Use: 1.2 oz/week    2 Cans of beer per week  . Drug Use: No  . Sexually Active: No   Other Topics Concern  . Not on file   Social History Narrative  . No narrative on file     Family History  Problem Relation Age of Onset  . Kidney disease Mother   . Kidney disease Father      ROS:  Please see the history of present illness.  All other systems reviewed and negative.    Physical Exam: Blood pressure 163/76, pulse 82, temperature 97.7 F (36.5 C), temperature source Oral, resp. rate 18, height 5\' 4"  (1.626 m), weight 127 lb (57.607 kg), SpO2 98.00%. General: Well developed, well nourished age appearing Caucasian male in no acute distress. Head: Normocephalic, atraumatic, sclera non-icteric, no xanthomas, nares are without discharge. Lymph Nodes:  none Neck: Negative for carotid bruits. Brisk and full JVD not elevated. Lungs: Clear bilaterally to auscultation without wheezes, rales, or rhonchi. Breathing is unlabored. Heart: RRR with S1 S2-split. 2/6 systolic murmur along the right upper sternal border Back-without kyphosis scoliosis or CVA tenderness Abdomen: Soft, non-tender, non-distended with normoactive  bowel sounds. No hepatomegaly. No rebound/guarding. No obvious abdominal masses. Msk:  Strength and tone appear normal for age. Extremities: No clubbing or cyanosis. No edema.  Distal pedal pulses are 2+ and equal bilaterally. Skin: Warm and Dry Neuro: Alert and oriented X 3. CN III-XII intact Grossly normal sensory and motor function . Psych:  Responds to questions appropriately with a normal affect.      Labs: Cardiac Enzymes  Basename 10/02/11 0820 10/01/11 2304 10/01/11 1555  CKTOTAL 47 48 63  CKMB 2.8 3.0 3.7  TROPONINI <0.30 <0.30 <0.30   CBC Lab Results  Component Value Date   WBC 8.5 10/02/2011   HGB 11.3* 10/02/2011   HCT 33.6* 10/02/2011   MCV 104.0* 10/02/2011   PLT 203 10/02/2011   PROTIME:  Basename 10/02/11 0816  LABPROT 14.5  INR 1.11   Chemistry   Lab 10/02/11 0816  NA 141  K 4.8  CL 106  CO2 24  BUN 32*  CREATININE 1.38*  CALCIUM 9.6  PROT 6.5  BILITOT 0.8  ALKPHOS 74  ALT 30  AST 28  GLUCOSE 131*   Lipids No results found for this basename: CHOL,  HDL,  LDLCALC,  TRIG   BNP No results found for this basename: probnp   Miscellaneous No results found for this basename: DDIMER    Radiology/Studies:  No results found.  EKG: Sinus rhythm with first degree AV block at approximately 360 ms and right bundle branch block QRS duration 160 ms   Assessment and Plan:   Patient Active Hospital Problem List: Syncope and collapse (10/01/2011)   Atrioventricular block, complete (10/03/2011)   * HTN (hypertension) (10/01/2011)   Anemia (10/02/2011)    Aortic Stenosis  The patient has symptomatic intermittent complete heart block in the setting of right bundle branch/first degree AV block. Pacing is indicated for relief of symptoms. There are no particular contributing medications. It is likely related to her and aortic valve disease  The benefits and risks were reviewed including but not limited to death,  perforation, infection, lead  dislodgement and device malfunction.  The patient understands agrees and is willing to proceed.    Sherryl Manges

## 2011-10-03 NOTE — Progress Notes (Addendum)
[name]  John Weaver ZOX:[id number] DOB: Nov 23, 1915 DOA: 10/01/2011   Assessment/Plan: Patient Active Hospital Problem List: Syncope and collapse (10/01/2011) secondarily to complete heart block: -7 second pause on telmetry telemetry, EP recommended dual chamber pacemaker. -temporary pacing wire was placed emergently, then permanent pacer in placed. -ECHO: Aortic valve: There was moderate stenosis. EF 60-65%.  Bradycardia (10/01/2011)  resolved. See above.  Hypothyroidism (10/01/2011)  TSH is within normal limits continue current dose.  HTN (hypertension) (10/01/2011)  well controlled.  Anemia (10/02/2011) -he relates he drinks about 4 glasses of wine a week and anemia panel has been ordered. As his MCV is 104 and his hemoglobin is 11.3, he had a recent colonoscopy 3 years ago by Dr. Lamount Cranker as per patient it was within normal limits.   Family Communication: Patient and family Disposition Plan: Inpatient  Lambert Keto, MD  [facility] Pager [phone]  If 7PM-7AM, please contact night-coverage [email or web address] Password [email or web address] 10/03/2011, 12:18 PM   LOS: 2 days   Procedures:  2-D echo pending  Antibiotics: None  Subjective: Patient has no complaints at this time, he has no dizziness nausea or vomiting.  Objective: Filed Vitals:   10/02/11 2100 10/03/11 0817 10/03/11 0818 10/03/11 0832  BP: 163/76 203/111 160/98   Pulse: 82 97  86  Temp: 97.7 F (36.5 C) 97.7 F (36.5 C)    TempSrc: Oral Oral    Resp: 18     Height:      Weight:      SpO2: 98% 98%     No intake or output data in the 24 hours ending 10/03/11 1218 Weight change:   Exam:  General: Alert, awake, oriented x3, in no acute distress.  HEENT: No bruits, no goiter.  Heart: Regular rate and rhythm, without murmurs, rubs, gallops.  Lungs: Good air movement clear to auscultation. Abdomen: Soft, nontender, nondistended, positive bowel sounds.  Neuro: Grossly  intact, nonfocal.   Data Reviewed: Basic Metabolic Panel:  Lab 10/02/11 8295 10/01/11 1555 10/01/11 1147  NA 141 -- 141  K 4.8 -- 4.8  CL 106 -- 108  CO2 24 -- --  GLUCOSE 131* -- 124*  BUN 32* -- 45*  CREATININE 1.38* 1.51* 1.60*  CALCIUM 9.6 -- --  MG -- 2.3 --  PHOS -- -- --   Liver Function Tests:  Lab 10/02/11 0816  AST 28  ALT 30  ALKPHOS 74  BILITOT 0.8  PROT 6.5  ALBUMIN 3.7   No results found for this basename: LIPASE:5,AMYLASE:5 in the last 168 hours No results found for this basename: AMMONIA:5 in the last 168 hours CBC:  Lab 10/02/11 0816 10/01/11 1147 10/01/11 1118  WBC 8.5 -- 8.3  NEUTROABS -- -- --  HGB 11.3* 11.9* 11.1*  HCT 33.6* 35.0* 33.1*  MCV 104.0* -- 102.5*  PLT 203 -- 185   Cardiac Enzymes:  Lab 10/02/11 0820 10/01/11 2304 10/01/11 1555  CKTOTAL 47 48 63  CKMB 2.8 3.0 3.7  CKMBINDEX -- -- --  TROPONINI <0.30 <0.30 <0.30   BNP: No components found with this basename: POCBNP:5 CBG: No results found for this basename: GLUCAP:5 in the last 168 hours  No results found for this or any previous visit (from the past 240 hour(s)).   Studies: No results found.  Scheduled Meds:    . aspirin EC  81 mg Oral Daily  . ceFAZolin      . heparin      . heparin  5,000 Units Subcutaneous  Q8H  . levothyroxine  100 mcg Oral QAC breakfast  . lidocaine      . lidocaine      . multivitamin with minerals  1 tablet Oral Daily  . nitroGLYCERIN      . simvastatin  20 mg Oral q1800  . sodium chloride  3 mL Intravenous Q12H  . sodium chloride  3 mL Intravenous Q12H  . vitamin C  250 mg Oral Daily  . DISCONTD:  ceFAZolin (ANCEF) IV  1 g Intravenous On Call  . DISCONTD: gentamicin irrigation  80 mg Irrigation On Call   Continuous Infusions:

## 2011-10-04 ENCOUNTER — Inpatient Hospital Stay (HOSPITAL_COMMUNITY): Payer: Medicare Other

## 2011-10-04 DIAGNOSIS — I10 Essential (primary) hypertension: Secondary | ICD-10-CM

## 2011-10-04 DIAGNOSIS — I442 Atrioventricular block, complete: Principal | ICD-10-CM

## 2011-10-04 MED ORDER — DOPAMINE-DEXTROSE 3.2-5 MG/ML-% IV SOLN
INTRAVENOUS | Status: AC
  Filled 2011-10-04: qty 250

## 2011-10-04 NOTE — Progress Notes (Signed)
[name]  John Weaver ZOX:[id number] DOB: Aug 27, 1915 DOA: 10/01/2011   Assessment/Plan: Patient Active Hospital Problem List: Syncope and collapse (10/01/2011) secondarily to complete heart block: -7 second pause on telmetry telemetry, EP recommended dual chamber pacemaker. -s/p permanent pacer in 10/03/2011. -ECHO: Aortic valve: There was moderate stenosis. EF 60-65%. dispo per Dr. Graciela Husbands  Bradycardia (10/01/2011)  resolved. See above.  Hypothyroidism (10/01/2011)  TSH is within normal limits continue current dose.  HTN (hypertension) (10/01/2011)  well controlled.  Anemia (10/02/2011) -he relates he drinks about 4 glasses of wine a week and anemia panel has been ordered. As his MCV is 104 and his hemoglobin is 11.3, he had a recent colonoscopy 3 years ago by Dr. Lamount Cranker as per patient it was within normal limits.   Family Communication: Patient and family Disposition Plan: Inpatient  Lambert Keto, MD  [facility] Pager [phone]  If 7PM-7AM, please contact night-coverage [email or web address] Password [email or web address] 10/04/2011, 8:53 AM   LOS: 3 days   Procedures:  2-D echo pending  Antibiotics: None  Subjective: No complains  Objective: Filed Vitals:   10/04/11 0500 10/04/11 0600 10/04/11 0700 10/04/11 0800  BP: 140/73 120/48 137/68   Pulse: 72 66 71 74  Temp:   97.8 F (36.6 C)   TempSrc:   Oral   Resp: 20 13 15 20   Height:      Weight: 58 kg (127 lb 13.9 oz)     SpO2: 99% 99% 100% 100%    Intake/Output Summary (Last 24 hours) at 10/04/11 0853 Last data filed at 10/04/11 0800  Gross per 24 hour  Intake   1090 ml  Output    625 ml  Net    465 ml   Weight change:   Exam:  General: Alert, awake, oriented x3, in no acute distress.  HEENT: No bruits, no goiter.  Heart: Regular rate and rhythm, without murmurs, rubs, gallops.  Lungs: Good air movement clear to auscultation. Abdomen: Soft, nontender, nondistended, positive bowel  sounds.  Neuro: Grossly intact, nonfocal.   Data Reviewed: Basic Metabolic Panel:  Lab 10/02/11 8295 10/01/11 1555 10/01/11 1147  NA 141 -- 141  K 4.8 -- 4.8  CL 106 -- 108  CO2 24 -- --  GLUCOSE 131* -- 124*  BUN 32* -- 45*  CREATININE 1.38* 1.51* 1.60*  CALCIUM 9.6 -- --  MG -- 2.3 --  PHOS -- -- --   Liver Function Tests:  Lab 10/02/11 0816  AST 28  ALT 30  ALKPHOS 74  BILITOT 0.8  PROT 6.5  ALBUMIN 3.7   No results found for this basename: LIPASE:5,AMYLASE:5 in the last 168 hours No results found for this basename: AMMONIA:5 in the last 168 hours CBC:  Lab 10/02/11 0816 10/01/11 1147 10/01/11 1118  WBC 8.5 -- 8.3  NEUTROABS -- -- --  HGB 11.3* 11.9* 11.1*  HCT 33.6* 35.0* 33.1*  MCV 104.0* -- 102.5*  PLT 203 -- 185   Cardiac Enzymes:  Lab 10/02/11 0820 10/01/11 2304 10/01/11 1555  CKTOTAL 47 48 63  CKMB 2.8 3.0 3.7  CKMBINDEX -- -- --  TROPONINI <0.30 <0.30 <0.30   BNP: No components found with this basename: POCBNP:5 CBG: No results found for this basename: GLUCAP:5 in the last 168 hours  Recent Results (from the past 240 hour(s))  MRSA PCR SCREENING     Status: Normal   Collection Time   10/03/11  1:00 PM      Component Value Range Status Comment  MRSA by PCR NEGATIVE  NEGATIVE Final      Studies: No results found.  Scheduled Meds:    . aspirin EC  81 mg Oral Daily  . ceFAZolin      .  ceFAZolin (ANCEF) IV  1 g Intravenous Q6H  . levothyroxine  100 mcg Oral QAC breakfast  . lidocaine      . multivitamin with minerals  1 tablet Oral Daily  . simvastatin  20 mg Oral q1800  . sodium chloride  3 mL Intravenous Q12H  . sodium chloride  3 mL Intravenous Q12H  . vitamin C  250 mg Oral Daily  . DISCONTD:  ceFAZolin (ANCEF) IV  1 g Intravenous On Call  . DISCONTD: gentamicin irrigation  80 mg Irrigation On Call  . DISCONTD: heparin  5,000 Units Subcutaneous Q8H   Continuous Infusions:    . sodium chloride 50 mL/hr (10/03/11 1230)

## 2011-10-04 NOTE — Progress Notes (Signed)
   ELECTROPHYSIOLOGY ROUNDING NOTE    Patient Name: John Weaver Date of Encounter: 10-04-2011    SUBJECTIVE:Patient feels well.  No chest pain or shortness of breath.  Minimal incisional soreness. S/p PPM implant 10-03-2011 for complete heart block  TELEMETRY: Reviewed telemetry pt in AS/VP Filed Vitals:   10/04/11 0300 10/04/11 0400 10/04/11 0500 10/04/11 0600  BP: 120/61 130/49 140/73 120/48  Pulse: 69 64 72 66  Temp:  98.1 F (36.7 C)    TempSrc:  Oral    Resp: 17 15 20 13   Height:      Weight:   127 lb 13.9 oz (58 kg)   SpO2: 98% 98% 99% 99%    Intake/Output Summary (Last 24 hours) at 10/04/11 0708 Last data filed at 10/04/11 0551  Gross per 24 hour  Intake    830 ml  Output    425 ml  Net    405 ml   LABS: Basic Metabolic Panel:  Basename 10/02/11 0816 10/01/11 1555 10/01/11 1147  NA 141 -- 141  K 4.8 -- 4.8  CL 106 -- 108  CO2 24 -- --  GLUCOSE 131* -- 124*  BUN 32* -- 45*  CREATININE 1.38* 1.51* --  CALCIUM 9.6 -- --  MG -- 2.3 --  PHOS -- -- --   Liver Function Tests:  [facility] 10/02/11 0816  AST 28  ALT 30  ALKPHOS 74  BILITOT 0.8  PROT 6.5  ALBUMIN 3.7   CBC:  Basename 10/02/11 0816 10/01/11 1147 10/01/11 1118  WBC 8.5 -- 8.3  NEUTROABS -- -- --  HGB 11.3* 11.9* --  HCT 33.6* 35.0* --  MCV 104.0* -- 102.5*  PLT 203 -- 185   Cardiac Enzymes:  Basename 10/02/11 0820 10/01/11 2304 10/01/11 1555  CKTOTAL 47 48 63  CKMB 2.8 3.0 3.7  CKMBINDEX -- -- --  TROPONINI <0.30 <0.30 <0.30   Thyroid Function Tests:  Basename 10/01/11 1555  TSH 2.164  T4TOTAL --  T3FREE --  THYROIDAB --   Anemia Panel:  Basename 10/02/11 0816  VITAMINB12 658  FOLATE >20.0  FERRITIN 313  TIBC 257  IRON 90  RETICCTPCT 1.0   Radiology/Studies:  Final result pending, leads in stable position.  PHYSICAL EXAM Left chest without hematoma or ecchymosis  DEVICE INTERROGATION: Device interrogation pending.   Wound care, arm mobility, restrictions  reviewed with patient.  Plans per Dr Graciela Husbands followup per Deboraha Sprang  Will sign off  Call for questions

## 2011-10-04 NOTE — Progress Notes (Addendum)
SUBJECTIVE:  POD #1 s/p PPM doing well OBJECTIVE:   Vitals:   Filed Vitals:   10/04/11 0300 10/04/11 0400 10/04/11 0500 10/04/11 0600  BP: 120/61 130/49 140/73 120/48  Pulse: 69 64 72 66  Temp:  98.1 F (36.7 C)    TempSrc:  Oral    Resp: 17 15 20 13   Height:      Weight:   58 kg (127 lb 13.9 oz)   SpO2: 98% 98% 99% 99%   I&O's:   Intake/Output Summary (Last 24 hours) at 10/04/11 0725 Last data filed at 10/04/11 0551  Gross per 24 hour  Intake    830 ml  Output    425 ml  Net    405 ml   TELEMETRY: Reviewed telemetry pt in NSR     PHYSICAL EXAM General: Well developed, well nourished, in no acute distress Head: Eyes PERRLA, No xanthomas.   Normal cephalic and atramatic  Lungs:   Clear bilaterally to auscultation and percussion. Heart:   HRRR S1 S2 Pulses are 2+ & equal. Abdomen: Bowel sounds are positive, abdomen soft and non-tender without masses Extremities:   No clubbing, cyanosis or edema.  DP +1 Neuro: Alert and oriented X 3. Psych:  Good affect, responds appropriately   LABS: Basic Metabolic Panel:  Basename 10/02/11 0816 10/01/11 1555 10/01/11 1147  NA 141 -- 141  K 4.8 -- 4.8  CL 106 -- 108  CO2 24 -- --  GLUCOSE 131* -- 124*  BUN 32* -- 45*  CREATININE 1.38* 1.51* --  CALCIUM 9.6 -- --  MG -- 2.3 --  PHOS -- -- --   Liver Function Tests:  [facility] 10/02/11 0816  AST 28  ALT 30  ALKPHOS 74  BILITOT 0.8  PROT 6.5  ALBUMIN 3.7   No results found for this basename: LIPASE:2,AMYLASE:2 in the last 72 hours CBC:  Basename 10/02/11 0816 10/01/11 1147 10/01/11 1118  WBC 8.5 -- 8.3  NEUTROABS -- -- --  HGB 11.3* 11.9* --  HCT 33.6* 35.0* --  MCV 104.0* -- 102.5*  PLT 203 -- 185   Cardiac Enzymes:  Basename 10/02/11 0820 10/01/11 2304 10/01/11 1555  CKTOTAL 47 48 63  CKMB 2.8 3.0 3.7  CKMBINDEX -- -- --  TROPONINI <0.30 <0.30 <0.30   Thyroid Function Tests:  Basename 10/01/11 1555  TSH 2.164  T4TOTAL --  T3FREE --  THYROIDAB --    Anemia Panel:  Basename 10/02/11 0816  VITAMINB12 658  FOLATE >20.0  FERRITIN 313  TIBC 257  IRON 90  RETICCTPCT 1.0   Coag Panel:   Lab Results  Component Value Date   INR 1.11 10/02/2011    RADIOLOGY: No results found.    ASSESSMENT:  1. Presyncope vs.Syncope secondary to advanced conduction system disease and complete heart block - s/p PPM 2. RBBB  3. HTN  4. Hypothyroidism  5. Moderate AS  PLAN:   1.  Probable discharge tomorrow pending review of pacer check per Dr. Graciela Husbands 2.  Followup in my office in 10 days for wound check 3.  EP to leave discharge instructions on wound care and activity level  Quintella Reichert, MD  10/04/2011  7:25 AM

## 2011-10-04 NOTE — Discharge Instructions (Signed)
   Supplemental Discharge Instructions for  Pacemaker/Defibrillator Patients  Activity No heavy lifting or vigorous activity with your left/right arm for 6 to 8 weeks.  Do not raise your left/right arm above your head for one week.  Gradually raise your affected arm as drawn below.           07/04                       07/05                       07/06                      07/07       NO DRIVING for 1 week; you may begin driving on 10/11/2011. WOUND CARE   Keep the wound area clean and dry.  Do not get this area wet for one week. No showers for one week; you may shower on 10/11/2011.   The tape/steri-strips on your wound will fall off; do not pull them off.  No bandage is needed on the site.  DO  NOT apply any creams, oils, or ointments to the wound area.   If you notice any drainage or discharge from the wound, any swelling or bruising at the site, or you develop a fever > 101? F after you are discharged home, call the office at once.  Special Instructions   You are still able to use cellular telephones; use the ear opposite the side where you have your pacemaker/defibrillator.  Avoid carrying your cellular phone near your device.   When traveling through airports, show security personnel your identification card to avoid being screened in the metal detectors.  Ask the security personnel to use the hand wand.   Avoid arc welding equipment, MRI testing (magnetic resonance imaging), TENS units (transcutaneous nerve stimulators).  Call the office for questions about other devices.   Avoid electrical appliances that are in poor condition or are not properly grounded.   Microwave ovens are safe to be near or to operate.  

## 2011-10-05 NOTE — Discharge Summary (Signed)
DISCHARGE SUMMARY  John Weaver  MR#: [id number]  DOB:[birth year]  Date of Admission: 10/01/2011 Date of Discharge: 10/05/2011  Attending Physician:MCCLUNG,JEFFREY T  Patient's NWG:NFAOZ,HYQMVH John Hollingshead, MD  Consults: Quintella Reichert, MD Sherryl Manges, MD  Disposition: D/C home with wife  Follow-up Appts:  Follow-up Information    Follow up with Quintella Reichert, MD on 10/14/2011. (at 10am)    Contact information:   [address] [place] [address] [phone]       Call Lolita Patella, MD. (call to schedule a follow-up appointment within the next 7-10 days for a BP check and blood work)    [facility] [address] [place] [address] [phone]    Suggested F/U labs: BMET should be checked in 7-10 days to assure renal function has normalized  Discharge Diagnoses:  Present on Admission:  Syncope and collapse - cardiogenic Bradycardia - Complete Heart Block Hypothyroidism HTN (hypertension) Anemia Acute kidney injury - improving at time of d/c  Moderate aortic stenosis  Initial presentation: 78 year old male veteran of WW[employer] with past medical history of hypothyroidism and hypertension who presented to the ER with syncope and collapse. He related no trauma to the head. He related numerous episodes of dizziness for the past 3 weeks at least once a week.  On tele monitoring he was noted to be in complete heart block with pauses of up to 7 seconds.  Hospital Course:  Syncope and collapse secondarily to complete heart block:  - 7 second pauses on telmetry, EP recommended dual chamber pacemaker.  - s/p permanent pacer 10/03/2011 - ECHO this admit: Aortic valve: There was moderate stenosis. EF 60-65%.  - cleared for d/c by EP and Cards - pt is requesting outpatient cardiac rehab - to be arranged prior to d/c   Bradycardia / Complete Heart Block Resolved s/p pacer     Hypothyroidism (10/01/2011) TSH is within normal limits - continue current dose  HTN (hypertension) (10/01/2011) well controlled - no change in tx plan - ACE inhibitor on hold at time of d/c due to renal insuff  Anemia (10/02/2011) - MCV is 104 and his hemoglobin is 11.3 - B12 and folic acid normal this admit - Fe indices normal this admit - no evidence of acute bleeding / blood loss - had a colonoscopy 3 years ago by Dr. Matthias Hughs as per patient it was within normal limits  Acute kidney injury - no prior hx of renal disease - crt 1.6 at presentation  - crt improved to 1.38 at time of d/c  - felt to be due to poor renal perfusion in setting of severe bradycardia - ACE inhibitor on hold at time of d/c due to renal insuff  Medication List  As of 10/05/2011 11:45 AM   STOP taking these medications         lisinopril 20 MG tablet         TAKE these medications         aspirin EC 81 MG tablet   Take 81 mg by mouth daily.      levothyroxine 100 MCG tablet   Commonly known as: SYNTHROID, LEVOTHROID   Take 100 mcg by mouth daily.      multivitamins ther. w/minerals Tabs   Take 1 tablet by mouth daily.      pravastatin 40 MG tablet   Commonly known as: PRAVACHOL   Take 40 mg by mouth daily.  TYLENOL PM EXTRA STRENGTH PO   Take 2 tablets by mouth at bedtime as needed. For sleep      VITAMIN C PO   Take 1 tablet by mouth daily.           Day of Discharge BP 100/71  Pulse 79  Temp 97.5 F (36.4 C) (Axillary)  Resp 98  Ht 5\' 4"  (1.626 m)  Wt 56.9 kg (125 lb 7.1 oz)  BMI 21.53 kg/m2  SpO2 97%  Physical Exam: General: No acute respiratory distress Lungs: Clear to auscultation bilaterally without wheezes or crackles Cardiovascular: Regular rate and rhythm w/ 2/6 holosytolic M Abdomen: Nontender, nondistended, soft, bowel sounds positive, no rebound, no ascites, no appreciable mass Extremities: No significant cyanosis, clubbing, or edema bilateral lower  extremities  Time spent in discharge (includes decision making & examination of pt): >30 minutes  10/05/2011, 11:45 AM   Lonia Blood, MD [facility] Office  [phone] Pager [phone]  On-Call/Text Page:      [email or web address]      password [facility]

## 2011-10-10 ENCOUNTER — Telehealth: Payer: Self-pay | Admitting: Internal Medicine

## 2011-10-10 NOTE — Telephone Encounter (Signed)
Reviewed with John Weaver. The patient will need a wound check and a 3 month f/u with Dr. Graciela Husbands. I will forward a message to [place].

## 2011-10-10 NOTE — Telephone Encounter (Signed)
I spoke with the patient's wife regarding lisinopril. She reports this was a long term med for him, but it was not on his discharge papers. I explained that in reviewing his chart, his renal function was elevated and that his lisinopril was on hold until he could have a BP check and labs at Dr. Benjaman Pott office. The patient's wife states they have not scheduled this yet, but will do so. Per the patient's wife, they have also been contacted by our office by a Melissa to schedule an appointment. I advised this may be for a wound check. I will check with both the Melissa's in scheduling and ask them to call the patient/ his wife back.

## 2011-10-10 NOTE — Telephone Encounter (Signed)
New msg Pt was seen by Dr Graciela Husbands in hospital and wants to know about lisinopril. Please call

## 2011-10-12 ENCOUNTER — Ambulatory Visit (INDEPENDENT_AMBULATORY_CARE_PROVIDER_SITE_OTHER): Payer: Medicare Other | Admitting: *Deleted

## 2011-10-12 ENCOUNTER — Encounter: Payer: Self-pay | Admitting: Internal Medicine

## 2011-10-12 DIAGNOSIS — I442 Atrioventricular block, complete: Secondary | ICD-10-CM

## 2011-10-12 LAB — PACEMAKER DEVICE OBSERVATION
AL IMPEDENCE PM: 400 Ohm
AL THRESHOLD: 1 V
ATRIAL PACING PM: 9.2
BAMS-0001: 150 {beats}/min
BAMS-0003: 70 {beats}/min
DEVICE MODEL PM: 7350973
RV LEAD THRESHOLD: 0.75 V

## 2011-10-12 NOTE — Progress Notes (Signed)
Wound check-PPM 

## 2011-12-20 ENCOUNTER — Encounter: Payer: Self-pay | Admitting: Internal Medicine

## 2012-01-04 LAB — PACEMAKER DEVICE OBSERVATION

## 2012-01-18 ENCOUNTER — Emergency Department (HOSPITAL_COMMUNITY): Payer: Medicare Other

## 2012-01-18 ENCOUNTER — Emergency Department (HOSPITAL_COMMUNITY)
Admission: EM | Admit: 2012-01-18 | Discharge: 2012-01-18 | Disposition: A | Discharge status: Home / Self Care | Payer: Medicare Other | Attending: Emergency Medicine | Admitting: Emergency Medicine

## 2012-01-18 ENCOUNTER — Encounter (HOSPITAL_COMMUNITY): Payer: Self-pay

## 2012-01-18 DIAGNOSIS — Z9089 Acquired absence of other organs: Secondary | ICD-10-CM | POA: Insufficient documentation

## 2012-01-18 DIAGNOSIS — G319 Degenerative disease of nervous system, unspecified: Secondary | ICD-10-CM | POA: Insufficient documentation

## 2012-01-18 DIAGNOSIS — R55 Syncope and collapse: Secondary | ICD-10-CM | POA: Insufficient documentation

## 2012-01-18 DIAGNOSIS — E860 Dehydration: Secondary | ICD-10-CM

## 2012-01-18 DIAGNOSIS — R42 Dizziness and giddiness: Secondary | ICD-10-CM | POA: Insufficient documentation

## 2012-01-18 DIAGNOSIS — I10 Essential (primary) hypertension: Secondary | ICD-10-CM | POA: Insufficient documentation

## 2012-01-18 DIAGNOSIS — N390 Urinary tract infection, site not specified: Secondary | ICD-10-CM | POA: Insufficient documentation

## 2012-01-18 DIAGNOSIS — Z96659 Presence of unspecified artificial knee joint: Secondary | ICD-10-CM | POA: Insufficient documentation

## 2012-01-18 LAB — CBC WITH DIFFERENTIAL/PLATELET
Basophils Absolute: 0 10*3/uL (ref 0.0–0.1)
Eosinophils Relative: 1 % (ref 0–5)
Lymphocytes Relative: 33 % (ref 12–46)
Lymphs Abs: 2.5 10*3/uL (ref 0.7–4.0)
MCV: 101.1 fL — ABNORMAL HIGH (ref 78.0–100.0)
Neutrophils Relative %: 58 % (ref 43–77)
Platelets: 218 10*3/uL (ref 150–400)
RBC: 3.62 MIL/uL — ABNORMAL LOW (ref 4.22–5.81)
RDW: 13.4 % (ref 11.5–15.5)
WBC: 7.6 10*3/uL (ref 4.0–10.5)

## 2012-01-18 LAB — COMPREHENSIVE METABOLIC PANEL
ALT: 16 U/L (ref 0–53)
AST: 27 U/L (ref 0–37)
Alkaline Phosphatase: 98 U/L (ref 39–117)
CO2: 29 mEq/L (ref 19–32)
Calcium: 10.3 mg/dL (ref 8.4–10.5)
GFR calc non Af Amer: 48 mL/min — ABNORMAL LOW (ref >90)
Potassium: 4.5 mEq/L (ref 3.5–5.1)
Sodium: 139 mEq/L (ref 135–145)
Total Protein: 7.3 g/dL (ref 6.0–8.3)

## 2012-01-18 LAB — URINE MICROSCOPIC-ADD ON

## 2012-01-18 LAB — URINALYSIS, ROUTINE W REFLEX MICROSCOPIC
Glucose, UA: NEGATIVE mg/dL
Hgb urine dipstick: NEGATIVE
pH: 5.5 (ref 5.0–8.0)

## 2012-01-18 MED ORDER — CIPROFLOXACIN HCL 250 MG PO TABS: 250.0000 mg | ORAL_TABLET | Freq: Two times a day (BID) | ORAL | Status: DC

## 2012-01-18 MED ORDER — SODIUM CHLORIDE 0.9 % IV BOLUS (SEPSIS)
500.0000 mL | Freq: Once | INTRAVENOUS | Status: AC
  Administered 2012-01-18: 500 mL via INTRAVENOUS

## 2012-01-18 NOTE — ED Notes (Signed)
Syncope.  Pt has pacemaker.

## 2012-01-18 NOTE — ED Notes (Signed)
Pt transported to CT ?

## 2012-01-18 NOTE — ED Notes (Signed)
Pt here for near syncopal episode, sts bp was elevated and then had breif spell of syncope, pt sts he shouldn't work, feels like eyes are strained, pt alert and oriented. The same episode occurred when he had his PM placed.

## 2012-01-18 NOTE — ED Notes (Signed)
Charge nurse, Efraim Kaufmann, RN at bedside interrogating pts pacemaker.

## 2012-01-18 NOTE — ED Notes (Signed)
Pt ambulatory leaving ED with wife. Pt does not appear to be in any acute distress leaving. Pt and wife given discharge instructions and prescriptions for medication. Pt and wife have no further questions upon d/c.

## 2012-01-18 NOTE — ED Provider Notes (Signed)
History     CSN: [id number]  Arrival date & time 01/18/12  1314   First MD Initiated Contact with Patient 01/18/12 1505      Chief Complaint  Patient presents with  . Near Syncope    (Consider location/radiation/quality/duration/timing/severity/associated sxs/prior treatment) HPI Pt states he was drawing at his desk, felt lightheaded and nauseated and thinks he may have had a brief syncopal episode. No trauma. Pt states he feels at his baseline currently. No chest pain, sob at any point. Pt has not been drinking much lately. Increased urinary frequency.  Past Medical History  Diagnosis Date  . Back pain   . Kidney stones   . Hypertension   . Hypothyroidism   . Hypercholesteremia     Past Surgical History  Procedure Date  . Knee surgery     left knee  . Joint replacement     Left knee replacement    Family History  Problem Relation Age of Onset  . Kidney disease Mother   . Kidney disease Father     History  Substance Use Topics  . Smoking status: Never Smoker   . Smokeless tobacco: Not on file  . Alcohol Use: 1.2 oz/week    2 Cans of beer per week      Review of Systems  Constitutional: Negative for fever and chills.  HENT: Negative for neck pain.   Respiratory: Negative for cough and shortness of breath.   Cardiovascular: Negative for chest pain, palpitations and leg swelling.  Gastrointestinal: Positive for nausea. Negative for vomiting, abdominal pain and diarrhea.  Genitourinary: Positive for frequency. Negative for dysuria and hematuria.  Musculoskeletal: Negative for myalgias and back pain.  Skin: Negative for pallor, rash and wound.  Neurological: Positive for dizziness, syncope and light-headedness. Negative for weakness, numbness and headaches.    Allergies  Sulfa antibiotics  Home Medications   Current Outpatient Rx  Name Route Sig Dispense Refill  . ACETAMINOPHEN 325 MG PO TABS Oral Take 325 mg by mouth every 6 (six) hours as needed. For  pain    . VITAMIN C 1000 MG PO TABS Oral Take 1,000 mg by mouth daily.    . ASPIRIN EC 81 MG PO TBEC Oral Take 81 mg by mouth daily.    . TYLENOL PM EXTRA STRENGTH PO Oral Take 1-2 tablets by mouth at bedtime as needed. For sleep    . LEVOTHYROXINE SODIUM 100 MCG PO TABS Oral Take 100 mcg by mouth daily.    Carma Leaven M PLUS PO TABS Oral Take 1 tablet by mouth daily.    Marland Kitchen PRAVASTATIN SODIUM 40 MG PO TABS Oral Take 40 mg by mouth at bedtime.     . SENNOSIDES 15 MG PO TABS Oral Take 15 mg by mouth at bedtime as needed. For constipation    . TERBINAFINE HCL 1 % EX CREA Topical Apply 1 application topically 2 (two) times daily as needed. For athletes foot    . CIPROFLOXACIN HCL 250 MG PO TABS Oral Take 1 tablet (250 mg total) by mouth every 12 (twelve) hours. 14 tablet 0    BP 161/84  Pulse 84  Temp 97.8 F (36.6 C) (Oral)  Resp 19  SpO2 98%  Physical Exam  Nursing note and vitals reviewed. Constitutional: He is oriented to person, place, and time. He appears well-developed and well-nourished. No distress.  HENT:  Head: Normocephalic and atraumatic.  Mouth/Throat: Oropharynx is clear and moist.  Eyes: EOM are normal.  Irregular L pupil. Reactive R pupil  Neck: Normal range of motion. Neck supple.  Cardiovascular: Normal rate and regular rhythm.   Murmur (systolic ejection murmur) heard. Pulmonary/Chest: Effort normal and breath sounds normal. No respiratory distress. He has no wheezes. He has no rales.  Abdominal: Soft. Bowel sounds are normal. There is no tenderness. There is no rebound and no guarding.  Musculoskeletal: Normal range of motion. He exhibits edema (Mild RLE swelling. Chronic per pt). He exhibits no tenderness.  Neurological: He is alert and oriented to person, place, and time.       5/5 motor in all ext, sensation intact, bl finger to nose intact  Skin: Skin is warm and dry. No rash noted. No erythema.  Psychiatric: He has a normal mood and affect. His behavior is  normal.    ED Course  Procedures (including critical care time)  Labs Reviewed  CBC WITH DIFFERENTIAL - Abnormal; Notable for the following:    RBC 3.62 (*)     Hemoglobin 12.4 (*)     HCT 36.6 (*)     MCV 101.1 (*)     MCH 34.3 (*)     All other components within normal limits  COMPREHENSIVE METABOLIC PANEL - Abnormal; Notable for the following:    Glucose, Bld 131 (*)     BUN 32 (*)     GFR calc non Af Amer 48 (*)     GFR calc Af Amer 56 (*)     All other components within normal limits  URINALYSIS, ROUTINE W REFLEX MICROSCOPIC - Abnormal; Notable for the following:    APPearance HAZY (*)     Leukocytes, UA MODERATE (*)     All other components within normal limits  URINE MICROSCOPIC-ADD ON - Abnormal; Notable for the following:    Squamous Epithelial / LPF FEW (*)     Crystals CA OXALATE CRYSTALS (*)     All other components within normal limits  POCT I-STAT TROPONIN I  URINE CULTURE   Ct Head Wo Contrast  01/18/2012  *RADIOLOGY REPORT*  Clinical Data: Syncope  CT HEAD WITHOUT CONTRAST  Technique:  Contiguous axial images were obtained from the base of the skull through the vertex without contrast.  Comparison: None.  Findings: There is no evidence for acute hemorrhage, hydrocephalus, mass lesion, or abnormal extra-axial fluid collection.  No definite CT evidence for acute infarction.  Diffuse loss of parenchymal volume is consistent with atrophy. The visualized paranasal sinuses and mastoid air cells are clear.  IMPRESSION: No acute intracranial abnormality.  Atrophy   Original Report Authenticated By: ERIC A. MANSELL, M.D.      1. Near syncope   2. UTI (urinary tract infection)   3. Dehydration      Date: 01/18/2012  Rate: 77  Rhythm: normal sinus rhythm  QRS Axis: normal  Intervals: PR prolonged  ST/T Wave abnormalities: nonspecific T wave changes  Conduction Disutrbances:first-degree A-V block  and right bundle branch block  Narrative Interpretation:   Old EKG  Reviewed: unchanged    MDM   Pacer eval'd by [facility] rep. No abnormalities. Pt still w/o symptoms. Possible UTI. Ambulating well. Will d/c home to f/u with PMD. Pt advised to return immediately for worsening symptoms or concerns       Loren Racer, MD 01/18/12 [phone]

## 2012-01-18 NOTE — ED Notes (Signed)
Pt was sitting at desk and had near syncapol episode.  Pt has pacemaker that was put in July 2013.  Pt alert oriented X4.

## 2012-01-19 LAB — URINE CULTURE: Culture: NO GROWTH

## 2012-02-02 ENCOUNTER — Ambulatory Visit (INDEPENDENT_AMBULATORY_CARE_PROVIDER_SITE_OTHER): Payer: Medicare Other | Admitting: Internal Medicine

## 2012-02-02 ENCOUNTER — Encounter: Payer: Self-pay | Admitting: Internal Medicine

## 2012-02-02 VITALS — BP 168/84 | HR 84 | Temp 97.1°F | Ht 64.5 in | Wt 130.5 lb

## 2012-02-02 DIAGNOSIS — M7918 Myalgia, other site: Secondary | ICD-10-CM

## 2012-02-02 DIAGNOSIS — N390 Urinary tract infection, site not specified: Secondary | ICD-10-CM

## 2012-02-02 DIAGNOSIS — IMO0001 Reserved for inherently not codable concepts without codable children: Secondary | ICD-10-CM

## 2012-02-02 DIAGNOSIS — H3530 Unspecified macular degeneration: Secondary | ICD-10-CM | POA: Insufficient documentation

## 2012-02-02 DIAGNOSIS — R609 Edema, unspecified: Secondary | ICD-10-CM

## 2012-02-02 DIAGNOSIS — R030 Elevated blood-pressure reading, without diagnosis of hypertension: Secondary | ICD-10-CM

## 2012-02-02 DIAGNOSIS — N189 Chronic kidney disease, unspecified: Secondary | ICD-10-CM

## 2012-02-02 DIAGNOSIS — H359 Unspecified retinal disorder: Secondary | ICD-10-CM

## 2012-02-02 LAB — POCT URINALYSIS DIPSTICK
Blood, UA: NEGATIVE
Ketones, UA: NEGATIVE
Protein, UA: NEGATIVE
Spec Grav, UA: 1.015
Urobilinogen, UA: NEGATIVE

## 2012-02-03 ENCOUNTER — Telehealth: Payer: Self-pay | Admitting: Internal Medicine

## 2012-02-03 ENCOUNTER — Other Ambulatory Visit: Payer: Self-pay

## 2012-02-03 DIAGNOSIS — I639 Cerebral infarction, unspecified: Secondary | ICD-10-CM

## 2012-02-03 HISTORY — DX: Cerebral infarction, unspecified: I63.9

## 2012-02-03 MED ORDER — HYDROCODONE-ACETAMINOPHEN 5-500 MG PO TABS: 1.0000 | ORAL_TABLET | Freq: Two times a day (BID) | ORAL | Status: DC | PRN

## 2012-02-03 NOTE — Telephone Encounter (Signed)
Call in Vicodin 5/500 #60 one po q 12 hours

## 2012-02-04 DIAGNOSIS — M7918 Myalgia, other site: Secondary | ICD-10-CM | POA: Insufficient documentation

## 2012-02-04 DIAGNOSIS — R609 Edema, unspecified: Secondary | ICD-10-CM | POA: Insufficient documentation

## 2012-02-04 NOTE — Progress Notes (Signed)
Subjective:    Patient ID: John Weaver, male    DOB: 1915/07/01, 78 y.o.   MRN: [id number]  HPI Mr. Weaver was born in [place], [place] in 1917. 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 This was a position he held until his retirement. He is considered part of the [employer]. He continues to paint daily. He lives in [place], [place] and [place], [place].  He's had some issues with macular degeneration in his left eye and has had some injections per Dr. Luciana Axe which have helped the vision in his left eye. Before that, he was having difficulty finding the center of a canvas when painting.  He recently received a pacemaker per Dr. Graciela Husbands June 2013.  On October 16 he went to the emergency department for a near syncopal episode. He was diagnosed with volume depletion and urinary tract infection. He had moderate leukocytes in his urine. Urine specific gravity was 1.020. Electrolytes were normal. The BUN was 32 and creatinine was 1.23. Calcium was 10.3. Calcium has increased from 9.6 in June to 10.3. However in June when he was hospitalized, his creatinine went as high as 1.60 and BUN was 45. MCV on 1016 was 101.1 and was 104 in June. He had a CT of the brain without contrast showing no acute intracranial abnormality. EKG showed sinus rhythm and sinus arrhythmia, right bundle branch block, left anterior fascicular block, first degree AV block with PVCs He was given 500 cc of normal saline.  He takes Tylenol PM extra strength to sleep. He takes multivitamins, vitamin C and Ex-lax.  He has a history of hypothyroidism and is on Synthroid, takes Pravachol for hyperlipidemia. Hospitalized with war injuries during World War II.  Allergic to sulfa  History of back pain, bilateral hip pain, kidney stones in 1965 and  1996, mild hypertension not on antihypertensive medication. He has had a left knee replacement 1992. Appendectomy 1943. Drinks alcohol socially. Has never smoked. Has never used illicit drugs. He had lithotripsy for kidney stone 1996.  Urine culture October 16 revealed no growth. However he responded to antibiotic treatment and is feeling much better. White blood cell count on October 16 was 7600, hemoglobin 12.4 g which is good for him. He usually is in the 11 g range. MCV was 101.1 and platelet count was 218,000. He had a normal differential. Estimated GFR 56 cc per minute. Troponin 1 was negative.   Colon Study by Dr. Matthias Hughs  in 2008 or 2009  He exercises daily by stretching and twice weekly walks in a pool.  He has a son and a daughter in their 84's. He  lives with his wife, John Weaver, a renowned art historian.  Family history: Father died at age 55 and 74 of kidney problems. Mother died in 46 at age 65 of a stroke. One brother age 10 he was killed in 19 in World War II. No other siblings.  Previously, he was on lisinopril for hypertension but that was on hold after insertion of his pacemaker because of elevated creatinine. I will continue to hold it for now with recent near syncope.  I am concerned about orthostasis.  Also, he has some evidence of chronic kidney disease.   Says he's been trying to walk like a sailor because of bilateral hip pain. Felt it was more  comfortable to have a swagger when walking. Suspect he has osteoarthritis of the hips. He's not itched in hip replacement surgery at his age.  He had a 2-D echocardiogram in the hospital in June 2013 at the time of his pacemaker insertion showings moderate aortic stenosis with severely calcified aortic leaflets.  His legs swell during the day while he is sitting but improve overnight being much better when he arises in the morning.  Review of Systems     Objective:   Physical Exam skin is warm and dry; he is alert ,  oriented , delightful and amazing. Chest is clear to auscultation; cardiac exam regular rate and rhythm; he has a harsh honking 3/6 systolic ejection murmur .  Now at noon, he has trace pitting edema of the right lower extremity and just trace edema of the left lower extremity that does not pit. Some stasis dermatitis of the right lower leg.        Assessment & Plan:  Recent near syncopal episode thought related to volume depletion and possible urinary tract infection. Culture had no growth. He was treated with antibiotics, IV fluids and improved.  History of pacemaker insertion June 2013  History of moderate aortic stenosis  History of hypertension-not been on antihypertensive medication since June 2013. Has some chronic kidney disease. Will leave off lisinopril for now-has upcoming appointment for physical exam soon and we'll reevaluate.  History of hyperlipidemia-on statin medication  Osteoarthritis of hips  Osteoarthritis of right knee  Status post left knee replacement  History of kidney stones  Dependent edema  Stasis dermatitis right leg  Plan: Continue same medications. Add Vicodin 5/500 #60 one by mouth Q. 12 hours when necessary hip and knee pain. This may increase constipation but I would rather have him on some mild narcotic pain medication than having him take NSAIDS.

## 2012-02-04 NOTE — Patient Instructions (Addendum)
Keep appointment for upcoming physical exam. Try Vicodin 5/500 up to twice daily if needed for musculoskeletal pain. Stay off of antihypertensive medication for now.

## 2012-02-07 ENCOUNTER — Encounter: Payer: Medicare Other | Admitting: Internal Medicine

## 2012-02-15 ENCOUNTER — Other Ambulatory Visit: Payer: Self-pay

## 2012-02-15 MED ORDER — LEVOTHYROXINE SODIUM 100 MCG PO TABS: 100.0000 ug | ORAL_TABLET | Freq: Every day | ORAL | Status: DC

## 2012-02-15 MED ORDER — PRAVASTATIN SODIUM 40 MG PO TABS: 40.0000 mg | ORAL_TABLET | Freq: Every day | ORAL | Status: DC

## 2012-02-16 ENCOUNTER — Emergency Department (HOSPITAL_COMMUNITY): Payer: Medicare Other

## 2012-02-16 ENCOUNTER — Encounter (HOSPITAL_COMMUNITY): Payer: Self-pay

## 2012-02-16 ENCOUNTER — Ambulatory Visit (INDEPENDENT_AMBULATORY_CARE_PROVIDER_SITE_OTHER): Payer: Medicare Other | Admitting: Internal Medicine

## 2012-02-16 ENCOUNTER — Encounter: Payer: Self-pay | Admitting: Internal Medicine

## 2012-02-16 ENCOUNTER — Other Ambulatory Visit: Payer: Self-pay

## 2012-02-16 ENCOUNTER — Inpatient Hospital Stay (HOSPITAL_COMMUNITY)
Admission: EM | Admit: 2012-02-16 | Discharge: 2012-02-18 | DRG: 069 | Disposition: A | Discharge status: Home / Self Care | Payer: Medicare Other | Attending: Internal Medicine | Admitting: Internal Medicine

## 2012-02-16 VITALS — BP 160/84 | HR 76 | Temp 97.2°F | Wt 131.0 lb

## 2012-02-16 DIAGNOSIS — R29898 Other symptoms and signs involving the musculoskeletal system: Secondary | ICD-10-CM

## 2012-02-16 DIAGNOSIS — I4510 Unspecified right bundle-branch block: Secondary | ICD-10-CM | POA: Diagnosis present

## 2012-02-16 DIAGNOSIS — D649 Anemia, unspecified: Secondary | ICD-10-CM | POA: Diagnosis present

## 2012-02-16 DIAGNOSIS — N183 Chronic kidney disease, stage 3 unspecified: Secondary | ICD-10-CM | POA: Diagnosis present

## 2012-02-16 DIAGNOSIS — Z8679 Personal history of other diseases of the circulatory system: Secondary | ICD-10-CM

## 2012-02-16 DIAGNOSIS — Z950 Presence of cardiac pacemaker: Secondary | ICD-10-CM | POA: Diagnosis present

## 2012-02-16 DIAGNOSIS — E039 Hypothyroidism, unspecified: Secondary | ICD-10-CM | POA: Diagnosis present

## 2012-02-16 DIAGNOSIS — R609 Edema, unspecified: Secondary | ICD-10-CM

## 2012-02-16 DIAGNOSIS — N189 Chronic kidney disease, unspecified: Secondary | ICD-10-CM | POA: Diagnosis present

## 2012-02-16 DIAGNOSIS — I359 Nonrheumatic aortic valve disorder, unspecified: Secondary | ICD-10-CM | POA: Diagnosis present

## 2012-02-16 DIAGNOSIS — Z823 Family history of stroke: Secondary | ICD-10-CM

## 2012-02-16 DIAGNOSIS — Z7982 Long term (current) use of aspirin: Secondary | ICD-10-CM

## 2012-02-16 DIAGNOSIS — Z96659 Presence of unspecified artificial knee joint: Secondary | ICD-10-CM

## 2012-02-16 DIAGNOSIS — M7918 Myalgia, other site: Secondary | ICD-10-CM

## 2012-02-16 DIAGNOSIS — Z882 Allergy status to sulfonamides status: Secondary | ICD-10-CM

## 2012-02-16 DIAGNOSIS — H3530 Unspecified macular degeneration: Secondary | ICD-10-CM

## 2012-02-16 DIAGNOSIS — I350 Nonrheumatic aortic (valve) stenosis: Secondary | ICD-10-CM | POA: Diagnosis present

## 2012-02-16 DIAGNOSIS — E7800 Pure hypercholesterolemia, unspecified: Secondary | ICD-10-CM | POA: Diagnosis present

## 2012-02-16 DIAGNOSIS — R4182 Altered mental status, unspecified: Secondary | ICD-10-CM

## 2012-02-16 DIAGNOSIS — I129 Hypertensive chronic kidney disease with stage 1 through stage 4 chronic kidney disease, or unspecified chronic kidney disease: Secondary | ICD-10-CM | POA: Diagnosis present

## 2012-02-16 DIAGNOSIS — S8010XA Contusion of unspecified lower leg, initial encounter: Secondary | ICD-10-CM

## 2012-02-16 DIAGNOSIS — Z79899 Other long term (current) drug therapy: Secondary | ICD-10-CM

## 2012-02-16 DIAGNOSIS — I440 Atrioventricular block, first degree: Secondary | ICD-10-CM | POA: Diagnosis present

## 2012-02-16 DIAGNOSIS — M6281 Muscle weakness (generalized): Secondary | ICD-10-CM

## 2012-02-16 DIAGNOSIS — I498 Other specified cardiac arrhythmias: Secondary | ICD-10-CM | POA: Diagnosis present

## 2012-02-16 DIAGNOSIS — R030 Elevated blood-pressure reading, without diagnosis of hypertension: Secondary | ICD-10-CM

## 2012-02-16 DIAGNOSIS — G459 Transient cerebral ischemic attack, unspecified: Principal | ICD-10-CM | POA: Diagnosis present

## 2012-02-16 LAB — CBC WITH DIFFERENTIAL/PLATELET
Basophils Relative: 1 % (ref 0–1)
Hemoglobin: 11.9 g/dL — ABNORMAL LOW (ref 13.0–17.0)
Lymphocytes Relative: 26 % (ref 12–46)
Lymphs Abs: 2.3 10*3/uL (ref 0.7–4.0)
MCHC: 33.8 g/dL (ref 30.0–36.0)
Monocytes Relative: 8 % (ref 3–12)
Neutro Abs: 5.5 10*3/uL (ref 1.7–7.7)
Neutrophils Relative %: 64 % (ref 43–77)
RBC: 3.53 MIL/uL — ABNORMAL LOW (ref 4.22–5.81)
WBC: 8.6 10*3/uL (ref 4.0–10.5)

## 2012-02-16 LAB — COMPREHENSIVE METABOLIC PANEL
Albumin: 3.8 g/dL (ref 3.5–5.2)
Alkaline Phosphatase: 135 U/L — ABNORMAL HIGH (ref 39–117)
BUN: 25 mg/dL — ABNORMAL HIGH (ref 6–23)
CO2: 28 mEq/L (ref 19–32)
Chloride: 101 mEq/L (ref 96–112)
Potassium: 4.4 mEq/L (ref 3.5–5.1)
Total Bilirubin: 0.6 mg/dL (ref 0.3–1.2)

## 2012-02-16 LAB — TROPONIN I: Troponin I: <0.3 ng/mL (ref <0.30)

## 2012-02-16 LAB — RAPID URINE DRUG SCREEN, HOSP PERFORMED: Opiates: NOT DETECTED

## 2012-02-16 MED ORDER — SIMVASTATIN 20 MG PO TABS
20.0000 mg | ORAL_TABLET | Freq: Every day | ORAL | Status: DC
  Administered 2012-02-16 – 2012-02-17 (×2): 20 mg via ORAL
  Filled 2012-02-16 (×3): qty 1

## 2012-02-16 MED ORDER — ENOXAPARIN SODIUM 40 MG/0.4ML ~~LOC~~ SOLN
40.0000 mg | SUBCUTANEOUS | Status: DC
  Administered 2012-02-16 – 2012-02-17 (×2): 40 mg via SUBCUTANEOUS
  Filled 2012-02-16 (×3): qty 0.4

## 2012-02-16 MED ORDER — ACETAMINOPHEN 325 MG PO TABS: 650.0000 mg | ORAL_TABLET | ORAL | Status: DC | PRN

## 2012-02-16 MED ORDER — CLOPIDOGREL BISULFATE 75 MG PO TABS
75.0000 mg | ORAL_TABLET | Freq: Every day | ORAL | Status: DC
  Administered 2012-02-16 – 2012-02-18 (×3): 75 mg via ORAL
  Filled 2012-02-16 (×3): qty 1

## 2012-02-16 MED ORDER — LEVOTHYROXINE SODIUM 100 MCG PO TABS
100.0000 ug | ORAL_TABLET | Freq: Every day | ORAL | Status: DC
  Administered 2012-02-17 – 2012-02-18 (×2): 100 ug via ORAL
  Filled 2012-02-16 (×4): qty 1

## 2012-02-16 MED ORDER — ASPIRIN 325 MG PO TABS: 325.0000 mg | ORAL_TABLET | Freq: Every day | ORAL | Status: DC

## 2012-02-16 MED ORDER — LABETALOL HCL 5 MG/ML IV SOLN: 10.0000 mg | Freq: Three times a day (TID) | INTRAVENOUS | Status: DC | PRN

## 2012-02-16 NOTE — ED Notes (Signed)
Patient transported to CT 

## 2012-02-16 NOTE — ED Notes (Signed)
Per pt's wife, pt has bruising on his bottom extending down to his legs that she noticed last night. She mentioned this to his primary physician and she was told to tell the ED physician. Patient states he did not fall or injure himself.

## 2012-02-16 NOTE — Progress Notes (Signed)
  Subjective:    Patient ID: John Weaver, male    DOB: 09-09-1915, 78 y.o.   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 Apparently was sitting at his desk. May have fallen asleep. Seemed that he could have possibly been confused just a bit. Numbness has improved but weakness is still present. He has a history of pacemaker insertion this past summer. He is ambulatory. He is alert male speaking without dysarthria. No facial weakness noted. 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 He is right-handed. Wife has noted bruising left lower extremity. He denies any recent fall.    Review of Systems     Objective:   Physical Exam left hand grip 4/5 compared to right hand which is 5 over 5. He is alert and oriented. He knows me. Speech is without impediment. Chest clear. Cardiac regular rate and rhythm  He also has considerable bruising of left buttock extending down left posterior thigh and left lower leg.        Assessment & Plan:  Left hand weakness and numbness-possible CVA  History of pacemaker  History of hypertension  Unexplained contusions left lower extremity and buttock  Plan: To Emergency Department. Triage nurse notified by telephone regarding possible Code Stroke.

## 2012-02-16 NOTE — ED Notes (Signed)
Pt presents with onset of confusion and L arm weakness that he first noted at 0945 this morning.  Pt reports getting up to sit with his dog when wife noted that pt was confused.  Pt was able to walk to bathroom (denied any LLE weakness) but was unable to grip a glass of water.

## 2012-02-16 NOTE — ED Provider Notes (Addendum)
History     CSN: [id number]  Arrival date & time 02/16/12  1235   First MD Initiated Contact with Patient 02/16/12 1251      No chief complaint on file.   (Consider location/radiation/quality/duration/timing/severity/associated sxs/prior treatment) HPI Comments: The patient was sent here from Dr. Beryle Quant office for evaluation of left arm weakness that started this morning.  His wife also believed that he seemed confused and not quite himself.  He was having difficulty keeping liquids in his mouth.  He denies to me any injury or trauma.  No headache.    The history is provided by the patient and the spouse.    Past Medical History  Diagnosis Date  . Back pain   . Kidney stones   . Hypertension   . Hypothyroidism   . Hypercholesteremia     Past Surgical History  Procedure Date  . Knee surgery     left knee  . Joint replacement     Left knee replacement    Family History  Problem Relation Age of Onset  . Kidney disease Mother   . Kidney disease Father     History  Substance Use Topics  . Smoking status: Never Smoker   . Smokeless tobacco: Not on file  . Alcohol Use: 1.2 oz/week    2 Cans of beer per week      Review of Systems  All other systems reviewed and are negative.    Allergies  Sulfa antibiotics  Home Medications   Current Outpatient Rx  Name  Route  Sig  Dispense  Refill  . ACETAMINOPHEN 325 MG PO TABS   Oral   Take 325 mg by mouth every 6 (six) hours as needed. For pain         . VITAMIN C 1000 MG PO TABS   Oral   Take 1,000 mg by mouth daily.         . ASPIRIN EC 81 MG PO TBEC   Oral   Take 81 mg by mouth daily.         . TYLENOL PM EXTRA STRENGTH PO   Oral   Take 1-2 tablets by mouth at bedtime as needed. For sleep         . HYDROCODONE-ACETAMINOPHEN 5-500 MG PO TABS   Oral   Take 1 tablet by mouth 2 (two) times daily as needed. For pain         . LEVOTHYROXINE SODIUM 100 MCG PO TABS   Oral   Take 100 mcg by  mouth daily.         Carma Leaven M PLUS PO TABS   Oral   Take 1 tablet by mouth daily.         Marland Kitchen PRAVASTATIN SODIUM 40 MG PO TABS   Oral   Take 40 mg by mouth at bedtime.         . SENNOSIDES 15 MG PO TABS   Oral   Take 15 mg by mouth at bedtime as needed. For constipation         . TERBINAFINE HCL 1 % EX CREA   Topical   Apply 1 application topically 2 (two) times daily as needed. For athletes foot           BP 188/69  Pulse 70  Temp 97.4 F (36.3 C) (Oral)  Resp 19  SpO2 94%  Physical Exam  Nursing note and vitals reviewed. Constitutional: He is oriented to person, place, and time. He  appears well-developed and well-nourished. No distress.  HENT:  Head: Normocephalic and atraumatic.  Mouth/Throat: Oropharynx is clear and moist.  Eyes: Pupils are equal, round, and reactive to light.  Neck: Normal range of motion. Neck supple.  Cardiovascular: Normal rate and regular rhythm.   No murmur heard. Pulmonary/Chest: Effort normal and breath sounds normal.  Abdominal: Soft. Bowel sounds are normal.  Musculoskeletal: Normal range of motion. He exhibits no edema.  Neurological: He is alert and oriented to person, place, and time. No cranial nerve deficit. He exhibits normal muscle tone. Coordination normal.  Skin: Skin is warm and dry. He is not diaphoretic.    ED Course  Procedures (including critical care time)  Labs Reviewed  CBC WITH DIFFERENTIAL - Abnormal; Notable for the following:    RBC 3.53 (*)     Hemoglobin 11.9 (*)     HCT 35.2 (*)     All other components within normal limits  COMPREHENSIVE METABOLIC PANEL - Abnormal; Notable for the following:    BUN 25 (*)     Alkaline Phosphatase 135 (*)     GFR calc non Af Amer 54 (*)     GFR calc Af Amer 63 (*)     All other components within normal limits  GLUCOSE, CAPILLARY  TROPONIN I  PROTIME-INR  APTT   Ct Head Wo Contrast  02/16/2012  *RADIOLOGY REPORT*  Clinical Data: Onset of left hand weakness  and numbness this morning.  CT HEAD WITHOUT CONTRAST  Technique:  Contiguous axial images were obtained from the base of the skull through the vertex without contrast.  Comparison: 01/18/2012  Findings: The ventricles are normal in configuration.  There is ventricular and sulcal enlargement reflecting age related volume loss.  No parenchymal masses or mass effect.  There is no evidence of a recent infarct.  There are no extra-axial masses or abnormal fluid collections.  There is no intracranial hemorrhage.  The visualized sinuses and mastoid air cells are clear.  IMPRESSION: No acute intracranial abnormalities.  Age related volume loss.   Original Report Authenticated By: Amie Portland, M.D.      No diagnosis found.   Date: 02/16/2012  Rate: 83  Rhythm: normal sinus rhythm  QRS Axis: left  Intervals: PR prolonged  ST/T Wave abnormalities: normal  Conduction Disutrbances:first-degree A-V block , RBBB  Narrative Interpretation:   Old EKG Reviewed: unchanged    MDM  The patient was sent here by Dr. Lenord Fellers for possible tia.  The symptoms had nearly resolved pta.  He is now at baseline per patient and wife.  The workup reveals a negative head ct, labs, and ekg that is unchanged from priors.  I spoke with Dr. Lenord Fellers who would like to have him seen by neurology.  I have spoken to Dr. Roseanne Reno who wants the patient admitted to [facility] and he will consult.  I have spoken with [facility] who agrees to admit.          Geoffery Lyons, MD 02/16/12 1447  Geoffery Lyons, MD 03/06/12 [phone]

## 2012-02-16 NOTE — H&P (Signed)
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 Apparently was sitting at his desk. May have fallen asleep. Seemed that he could have possibly been confused just a bit. Recently was started on Vicodin for hip osteoarthritis. Numbness and weakness is gradually improved over the course of the day. He has a history of pacemaker insertion this past summer. He is ambulatory. He is speaking without dysarthria. No facial weakness noted. 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 He is right-handed. Wife has noted bruising left lower extremity. He denies any recent fall. He used to have syncopal episodes prior to pacemaker placement.Marland Kitchen He takes a baby aspirin everyday and took one this morning. He denies any previous history of CVA, atrial fibrillation. He recently received a pacemaker per Dr. Graciela Husbands June 2013      Review of Systems: negative for the following  Constitutional: Denies fever, chills, diaphoresis, appetite change and fatigue.  HEENT: Denies photophobia, eye pain, redness, hearing loss, ear pain, congestion, sore throat, rhinorrhea, sneezing, mouth sores, trouble swallowing, neck pain, neck stiffness and tinnitus.  Respiratory: Denies SOB, DOE, cough, chest tightness, and wheezing.  Cardiovascular: Denies chest pain, palpitations and leg swelling.  Gastrointestinal: Denies nausea, vomiting, abdominal pain, diarrhea, constipation, blood in stool and abdominal distention.  Genitourinary: Denies dysuria, urgency, frequency, hematuria, flank pain and difficulty urinating.  Musculoskeletal: Denies myalgias, back pain, joint swelling, arthralgias and gait problem.  Skin: Denies pallor, rash and wound.  Neurological: Denies  dizziness, seizures, syncope, weakness, light-headedness, numbness and headaches.  Hematological: Denies adenopathy. Easy bruising, personal or family bleeding history  Psychiatric/Behavioral: Denies suicidal ideation, mood changes, confusion, nervousness, sleep disturbance and agitation       Past Medical History  Diagnosis Date  . Back pain   . Kidney stones   . Hypertension   . Hypothyroidism   . Hypercholesteremia    colonoscopy in 2008   Past Surgical History  Procedure Date  . Knee surgery     left knee  . Joint replacement     Left knee replacement      Social History:  reports that he has never smoked. He does not have any smokeless tobacco history on file. He reports that he drinks about 1.2 ounces of alcohol per week. He reports that he does not use illicit drugs.    Allergies  Allergen Reactions  . Sulfa Antibiotics Rash    Family History  Problem Relation Age of Onset  . Kidney disease Mother   . Kidney disease Father    Father died at age 83 and 76 of kidney problems. Mother died in 10 at age 28 of a stroke. One brother age 6 he was killed in 12 in World War II. No other siblings.   Prior to Admission medications   Medication Sig Start Date End Date Taking? Authorizing Provider  acetaminophen (TYLENOL) 325 MG tablet Take 325 mg by mouth every 6 (six) hours as needed. For pain   Yes Historical Provider, MD  Ascorbic Acid (VITAMIN C) 1000 MG tablet Take 1,000 mg by mouth daily.   Yes Historical Provider, MD  aspirin EC 81 MG tablet Take 81 mg by mouth daily.   Yes Historical Provider, MD  Diphenhydramine-APAP, sleep, (TYLENOL PM EXTRA STRENGTH PO)  Take 1-2 tablets by mouth at bedtime as needed. For sleep   Yes Historical Provider, MD  HYDROcodone-acetaminophen (VICODIN) 5-500 MG per tablet Take 1 tablet by mouth 2 (two) times daily as needed. For pain 02/03/12  Yes Margaree Mackintosh, MD  levothyroxine (SYNTHROID, LEVOTHROID) 100 MCG tablet Take 100 mcg  by mouth daily. 02/15/12  Yes Margaree Mackintosh, MD  Multiple Vitamins-Minerals (MULTIVITAMINS THER. W/MINERALS) TABS Take 1 tablet by mouth daily.   Yes Historical Provider, MD  pravastatin (PRAVACHOL) 40 MG tablet Take 40 mg by mouth at bedtime. 02/15/12  Yes Margaree Mackintosh, MD  Sennosides (EX-LAX) 15 MG TABS Take 15 mg by mouth at bedtime as needed. For constipation   Yes Historical Provider, MD  terbinafine (LAMISIL) 1 % cream Apply 1 application topically 2 (two) times daily as needed. For athletes foot   Yes Historical Provider, MD     Physical Exam: Filed Vitals:   02/16/12 1241 02/16/12 1257  BP: 178/89 188/69  Pulse: 76 70  Temp: 97.4 F (36.3 C)   TempSrc: Oral   Resp: 16 19  SpO2: 98% 94%     Constitutional: Vital signs reviewed. Patient is a well-developed and well-nourished in no acute distress and cooperative with exam. Alert and oriented x3.  Head: Normocephalic and atraumatic  Ear: TM normal bilaterally  Mouth: no erythema or exudates, MMM  Eyes: PERRL, EOMI, conjunctivae normal, No scleral icterus.  Neck: Supple, Trachea midline normal ROM, No JVD, mass, thyromegaly, or carotid bruit present.  Cardiovascular: RRR, S1 normal, S2 normal, no MRG, pulses symmetric and intact bilaterally  Pulmonary/Chest: CTAB, no wheezes, rales, or rhonchi  Abdominal: Soft. Non-tender, non-distended, bowel sounds are normal, no masses, organomegaly, or guarding present.  GU: no CVA tenderness Musculoskeletal: No joint deformities, erythema, or stiffness, ROM full and no nontender Ext: no edema and no cyanosis, pulses palpable bilaterally (DP and PT)  Hematology: no cervical, inginal, or axillary adenopathy.  Neurological: A&O x3, Strenght is normal and symmetric bilaterally, cranial nerve II-XII are grossly intact, no focal motor deficit, sensory intact to light touch bilaterally.  Skin: Warm, dry and intact. No rash, cyanosis, or clubbing.  Psychiatric: Normal mood and affect. speech and  behavior is normal. Judgment and thought content normal. Cognition and memory are normal.       Labs on Admission:    Basic Metabolic Panel:  Lab 02/16/12 4098  NA 140  K 4.4  CL 101  CO2 28  GLUCOSE 89  BUN 25*  CREATININE 1.12  CALCIUM 9.9  MG --  PHOS --   Liver Function Tests:  Lab 02/16/12 1254  AST 33  ALT 19  ALKPHOS 135*  BILITOT 0.6  PROT 7.3  ALBUMIN 3.8   No results found for this basename: LIPASE:5,AMYLASE:5 in the last 168 hours No results found for this basename: AMMONIA:5 in the last 168 hours CBC:  Lab 02/16/12 1254  WBC 8.6  NEUTROABS 5.5  HGB 11.9*  HCT 35.2*  MCV 99.7  PLT 264   Cardiac Enzymes:  Lab 02/16/12 1254  CKTOTAL --  CKMB --  CKMBINDEX --  TROPONINI <0.30    BNP (last 3 results) No results found for this basename: PROBNP:3 in the last 8760 hours    CBG:  Lab 02/16/12 1242  GLUCAP 79    Radiological Exams on Admission: Ct Head Wo Contrast  02/16/2012  *RADIOLOGY REPORT*  Clinical Data: Onset of left hand weakness and numbness this morning.  CT HEAD WITHOUT  CONTRAST  Technique:  Contiguous axial images were obtained from the base of the skull through the vertex without contrast.  Comparison: 01/18/2012  Findings: The ventricles are normal in configuration.  There is ventricular and sulcal enlargement reflecting age related volume loss.  No parenchymal masses or mass effect.  There is no evidence of a recent infarct.  There are no extra-axial masses or abnormal fluid collections.  There is no intracranial hemorrhage.  The visualized sinuses and mastoid air cells are clear.  IMPRESSION: No acute intracranial abnormalities.  Age related volume loss.   Original Report Authenticated By: Amie Portland, M.D.     EKG: Date: 02/16/2012  Rate: 83  Rhythm: normal sinus rhythm  QRS Axis: left  Intervals: PR prolonged  ST/T Wave abnormalities: normal  Conduction Disutrbances:first-degree A-V block , RBBB  Narrative  Interpretation:  Old EKG Reviewed: unchanged   Assessment/Plan Active Problems:  Hypothyroidism  Anemia  Aortic stenosis  Pacemaker-[employer]  Chronic kidney disease  Borderline systolic hypertension  TIA (transient ischemic attack)     1. Probable TIA patient is already on aspirin, in the acute setting the patient received 325 mg of aspirin, but eventually switched to Plavix at the time of discharge. Pacemaker will be interrogated to evaluate for arrythmias.  The patient will be hospitalized for 24 hours observation, he has a pacemaker therefore  an MRI cannot be done. The patient's symptoms have nearly resolved, Dr. Lu Duffel neurology has been consulted. He will have a 2-D echo and a carotid Doppler. We'll check hemoglobin A1c and lipid panel 2. Hypothyroidism continue Synthroid 3.  Hypertension patient used to be on lisinopril which was discontinued, hypertensive with systolic in the 180s upon presentation. We'll start the patient on when necessary  Labetalol  Code Status:   full Family Communication: bedside Disposition Plan: admit   Time spent: 70 mins   [facility] Pager [phone]  If 7PM-7AM, please contact night-coverage [email or web address] Password [facility] 02/16/2012, 3:45 PM

## 2012-02-16 NOTE — Consult Note (Signed)
Referring Physician: Dr. Susie Cassette    Chief Complaint: left hand weakness  HPI: John Weaver is an 78 y.o. male who was LKW at 0830 today. He awoke like normal with his wife and ate breakfast. She left to work and he was working to put up a picture and he says that his dog was barking and was annoying him and he decided to sit down at 0830. The next thing he remembers is his wife waking him up at 0945. She says that he was confused and thought that he had not eaten breakfast, but cleared within minutes. He states that he was aware that he was confused. No history of seizures. No loss of urine, tongue biting.  He went to see his local physician, Dr. Lenord Fellers and he is now being admitted for workup. Wife states that his left hand grip has improved. Was on baby aspirin PTA.  He had a PPM placed during summer for heart block and CANNOT HAVE MRI.   LSN: [id number] tPA Given: No: out of window, symptoms resolving.  Past Medical History  Diagnosis Date  . Back pain   . Kidney stones   . Hypertension   . Hypothyroidism   . Hypercholesteremia     Past Surgical History  Procedure Date  . Knee surgery     left knee  . Joint replacement     Left knee replacement    Family History  Problem Relation Age of Onset  . Kidney disease Mother   . Kidney disease Father    Social History:  reports that he has never smoked. He does not have any smokeless tobacco history on file. He reports that he drinks about 1.2 ounces of alcohol per week. He reports that he does not use illicit drugs.  Allergies:  Allergies  Allergen Reactions  . Sulfa Antibiotics Rash    Medications:  Prior to Admission:  Prescriptions prior to admission  Medication Sig Dispense Refill  . acetaminophen (TYLENOL) 325 MG tablet Take 325 mg by mouth every 6 (six) hours as needed. For pain      . Ascorbic Acid (VITAMIN C) 1000 MG tablet Take 1,000 mg by mouth daily.      Marland Kitchen aspirin EC 81 MG tablet Take 81 mg by mouth daily.      .  Diphenhydramine-APAP, sleep, (TYLENOL PM EXTRA STRENGTH PO) Take 1-2 tablets by mouth at bedtime as needed. For sleep      . HYDROcodone-acetaminophen (VICODIN) 5-500 MG per tablet Take 1 tablet by mouth 2 (two) times daily as needed. For pain      . levothyroxine (SYNTHROID, LEVOTHROID) 100 MCG tablet Take 100 mcg by mouth daily.      . Multiple Vitamins-Minerals (MULTIVITAMINS THER. W/MINERALS) TABS Take 1 tablet by mouth daily.      . pravastatin (PRAVACHOL) 40 MG tablet Take 40 mg by mouth at bedtime.      . Sennosides (EX-LAX) 15 MG TABS Take 15 mg by mouth at bedtime as needed. For constipation      . terbinafine (LAMISIL) 1 % cream Apply 1 application topically 2 (two) times daily as needed. For athletes foot        ROS: History obtained from spouse and the patient  General ROS: negative for - chills, fatigue, fever, night sweats, weight gain or weight loss Psychological ROS: negative for - behavioral disorder, hallucinations, memory difficulties, mood swings or suicidal ideation Ophthalmic ROS: negative for - blurry vision, double vision, eye pain or loss of  vision ENT ROS: negative for - epistaxis, nasal discharge, oral lesions, sore throat, tinnitus or vertigo Allergy and Immunology ROS: negative for - hives or itchy/watery eyes Hematological and Lymphatic ROS: negative for - bleeding problems, bruising or swollen lymph nodes Endocrine ROS: negative for - galactorrhea, hair pattern changes, polydipsia/polyuria or temperature intolerance Respiratory ROS: negative for - cough, hemoptysis, shortness of breath or wheezing Cardiovascular ROS: negative for - chest pain, dyspnea on exertion, edema or irregular heartbeat Gastrointestinal ROS: negative for - abdominal pain, diarrhea, hematemesis, nausea/vomiting or stool incontinence Genito-Urinary ROS: negative for - dysuria, hematuria, incontinence or urinary frequency/urgency Musculoskeletal ROS: negative for - joint swelling or muscular  weakness Neurological ROS: as noted in HPI Dermatological ROS: negative for rash and skin lesion changes    Physical Examination: Blood pressure 168/86, pulse 79, temperature 97.6 F (36.4 C), temperature source Oral, resp. rate 20, SpO2 100.00%.  Neurologic Examination: Mental Status: Alert, oriented, thought content appropriate.  Speech fluent without evidence of aphasia.  Able to follow 3 step commands without difficulty. Cranial Nerves: II: visual fields grossly normal, pupils equal, round, reactive to light and accommodation III,IV, VI: ptosis not present, extra-ocular motions intact bilaterally V,VII: smile symmetric, facial light touch sensation normal bilaterally VIII: hearing normal bilaterally IX,X: gag reflex present XI: trapezius strength/neck flexion strength normal bilaterally XII: tongue strength normal  Motor: Right : Upper extremity   5/5    Left:     Upper extremity   4/5 (moreso hand weakness)  Lower extremity   5/5     Lower extremity   5/5 Tone and bulk:normal tone throughout; no atrophy noted Sensory: Pinprick and light touch intact throughout, bilaterally Deep Tendon Reflexes: 2+ and symmetric throughout Plantars: Right: downgoing   Left: downgoing Cerebellar: normal finger-to-nose, normal heel-to-shin test    Results for orders placed during the hospital encounter of 02/16/12 (from the past 48 hour(s))  GLUCOSE, CAPILLARY     Status: Normal   Collection Time   02/16/12 12:42 PM      Component Value Range Comment   Glucose-Capillary 79  70 - 99 mg/dL   CBC WITH DIFFERENTIAL     Status: Abnormal   Collection Time   02/16/12 12:54 PM      Component Value Range Comment   WBC 8.6  4.0 - 10.5 K/uL    RBC 3.53 (*) 4.22 - 5.81 MIL/uL    Hemoglobin 11.9 (*) 13.0 - 17.0 g/dL    HCT 16.1 (*) 09.6 - 52.0 %    MCV 99.7  78.0 - 100.0 fL    MCH 33.7  26.0 - 34.0 pg    MCHC 33.8  30.0 - 36.0 g/dL    RDW 04.5  40.9 - 81.1 %    Platelets 264  150 - 400 K/uL     Neutrophils Relative 64  43 - 77 %    Neutro Abs 5.5  1.7 - 7.7 K/uL    Lymphocytes Relative 26  12 - 46 %    Lymphs Abs 2.3  0.7 - 4.0 K/uL    Monocytes Relative 8  3 - 12 %    Monocytes Absolute 0.7  0.1 - 1.0 K/uL    Eosinophils Relative 2  0 - 5 %    Eosinophils Absolute 0.2  0.0 - 0.7 K/uL    Basophils Relative 1  0 - 1 %    Basophils Absolute 0.1  0.0 - 0.1 K/uL   COMPREHENSIVE METABOLIC PANEL     Status:  Abnormal   Collection Time   02/16/12 12:54 PM      Component Value Range Comment   Sodium 140  135 - 145 mEq/L    Potassium 4.4  3.5 - 5.1 mEq/L    Chloride 101  96 - 112 mEq/L    CO2 28  19 - 32 mEq/L    Glucose, Bld 89  70 - 99 mg/dL    BUN 25 (*) 6 - 23 mg/dL    Creatinine, Ser 8.11  0.50 - 1.35 mg/dL    Calcium 9.9  8.4 - 91.4 mg/dL    Total Protein 7.3  6.0 - 8.3 g/dL    Albumin 3.8  3.5 - 5.2 g/dL    AST 33  0 - 37 U/L    ALT 19  0 - 53 U/L    Alkaline Phosphatase 135 (*) 39 - 117 U/L    Total Bilirubin 0.6  0.3 - 1.2 mg/dL    GFR calc non Af Amer 54 (*) >90 mL/min    GFR calc Af Amer 63 (*) >90 mL/min   TROPONIN I     Status: Normal   Collection Time   02/16/12 12:54 PM      Component Value Range Comment   Troponin I <0.30  <0.30 ng/mL   PROTIME-INR     Status: Normal   Collection Time   02/16/12  1:15 PM      Component Value Range Comment   Prothrombin Time 13.7  11.6 - 15.2 seconds    INR 1.06  0.00 - 1.49   APTT     Status: Abnormal   Collection Time   02/16/12  1:15 PM      Component Value Range Comment   aPTT 38 (*) 24 - 37 seconds    Ct Head Wo Contrast 02/16/2012 No acute intracranial abnormalities.  Age related volume loss.        Assessment: 78 y.o. male with left arm/hand weakness, transient confusion episode today. Now with resultant left hand weakness. Was on baby aspirin 81mg  daily, will change to plavix 75mg  daily. EEG.  Stroke Risk Factors - hyperlipidemia and hypertension  Recommendations: 1. HgbA1c, fasting lipid panel 2. Repeat  CT 24 hours, patient cannot have MRI due to PPM 3. PT consult, OT consult, Speech consult 4. Echocardiogram 5. Carotid dopplers 6. Prophylactic therapy-Antiplatelet med: Plavix - dose 75mg  daily 7. Risk factor modification 8. Telemetry monitoring 9. Frequent neuro checks  Job Founds, MBA, [facility] Pager [phone]    Patient was personally evaluated by me. Clinical assessment as well as workup and treatment recommendations were formulated by me, as well.  Venetia Maxon M.D. [facility] [phone]

## 2012-02-16 NOTE — ED Notes (Signed)
Pt back from CT

## 2012-02-16 NOTE — Patient Instructions (Addendum)
Go immediately to  emergency department [facility]

## 2012-02-17 ENCOUNTER — Inpatient Hospital Stay (HOSPITAL_COMMUNITY): Payer: Medicare Other

## 2012-02-17 ENCOUNTER — Encounter (HOSPITAL_COMMUNITY): Payer: Self-pay

## 2012-02-17 ENCOUNTER — Telehealth: Payer: Self-pay | Admitting: Internal Medicine

## 2012-02-17 DIAGNOSIS — D649 Anemia, unspecified: Secondary | ICD-10-CM

## 2012-02-17 DIAGNOSIS — E039 Hypothyroidism, unspecified: Secondary | ICD-10-CM

## 2012-02-17 DIAGNOSIS — R030 Elevated blood-pressure reading, without diagnosis of hypertension: Secondary | ICD-10-CM

## 2012-02-17 DIAGNOSIS — R4182 Altered mental status, unspecified: Secondary | ICD-10-CM

## 2012-02-17 LAB — GLUCOSE, CAPILLARY

## 2012-02-17 LAB — LIPID PANEL
HDL: 53 mg/dL (ref >39)
LDL Cholesterol: 63 mg/dL (ref 0–99)
Triglycerides: 53 mg/dL (ref <150)
VLDL: 11 mg/dL (ref 0–40)

## 2012-02-17 MED ORDER — POLYETHYLENE GLYCOL 3350 17 G PO PACK
17.0000 g | PACK | Freq: Every day | ORAL | Status: DC
  Administered 2012-02-17: 17 g via ORAL
  Filled 2012-02-17 (×2): qty 1

## 2012-02-17 NOTE — Progress Notes (Signed)
  Echocardiogram 2D Echocardiogram has been performed.  Georgian Co 02/17/2012, 11:38 AM

## 2012-02-17 NOTE — Progress Notes (Signed)
Stroke Team Progress Note  HISTORY  John Weaver is an 78 y.o. male who was LKW at 0830 today. He awoke like normal with his wife and ate breakfast. She left to work and he was working to put up a picture and he says that his dog was barking and was annoying him and he decided to sit down at 0830. The next thing he remembers is his wife waking him up at 0945. She says that he was confused and thought that he had not eaten breakfast, but cleared within minutes. He states that he was aware that he was confused. No history of seizures. No loss of urine, tongue biting. He went to see his local physician, Dr. Lenord Fellers and he is now being admitted for workup. Wife states that his left hand grip has improved. Was on baby aspirin PTA.  He had a PPM placed during summer for heart block and CANNOT HAVE MRI.   LSN: [id number]  tPA Given: No: out of window, symptoms resolving  Patient was not a TPA candidate secondary to symptoms resolving and out of window time. He was admitted to the neuro floor for further evaluation and treatment.  SUBJECTIVE His wife is at the bedside.  Overall he feels his condition is   OBJECTIVE Most recent Vital Signs: Filed Vitals:   02/16/12 2256 02/17/12 0305 02/17/12 0308 02/17/12 0621  BP: 147/63 163/68 158/82 140/61  Pulse: 84 91  81  Temp: 97.9 F (36.6 C) 98 F (36.7 C)  98.7 F (37.1 C)  TempSrc: Oral   Oral  Resp: 18 18  16   SpO2: 99% 98%  98%   CBG (last 3)   Basename 02/16/12 1242  GLUCAP 79    IV Fluid Intake:     MEDICATIONS    . clopidogrel  75 mg Oral Q breakfast  . enoxaparin  40 mg Subcutaneous Q24H  . levothyroxine  100 mcg Oral QAC breakfast  . simvastatin  20 mg Oral q1800  . [DISCONTINUED] aspirin  325 mg Oral Daily   PRN:  acetaminophen, labetalol  Diet:  General thin liquids Activity:  As tolerated DVT Prophylaxis:  lovenox  CLINICALLY SIGNIFICANT STUDIES Basic Metabolic Panel:  Lab 02/16/12 8295  NA 140  K 4.4  CL 101  CO2 28    GLUCOSE 89  BUN 25*  CREATININE 1.12  CALCIUM 9.9  MG --  PHOS --   Liver Function Tests:  Lab 02/16/12 1254  AST 33  ALT 19  ALKPHOS 135*  BILITOT 0.6  PROT 7.3  ALBUMIN 3.8   CBC:  Lab 02/16/12 1254  WBC 8.6  NEUTROABS 5.5  HGB 11.9*  HCT 35.2*  MCV 99.7  PLT 264   Coagulation:  Lab 02/16/12 1315  LABPROT 13.7  INR 1.06   Cardiac Enzymes:  Lab 02/16/12 1254  CKTOTAL --  CKMB --  CKMBINDEX --  TROPONINI <0.30    Lipid Panel    Component Value Date/Time   CHOL 127 02/17/2012 0450   TRIG 53 02/17/2012 0450   HDL 53 02/17/2012 0450   CHOLHDL 2.4 02/17/2012 0450   VLDL 11 02/17/2012 0450   LDLCALC 63 02/17/2012 0450   HgbA1C--- in progress  Urine Drug Screen:     Component Value Date/Time   LABOPIA NONE DETECTED 02/16/2012 2251   COCAINSCRNUR NONE DETECTED 02/16/2012 2251   LABBENZ NONE DETECTED 02/16/2012 2251   AMPHETMU NONE DETECTED 02/16/2012 2251   THCU NONE DETECTED 02/16/2012 2251   LABBARB NONE DETECTED  02/16/2012 2251     Ct Head Wo Contrast 02/16/2012  No acute intracranial abnormalities.  Age related volume loss.     02/17/2012  Pending  MRI/MRI of the brain ---PATIENT HAS PPM   EEG---  2D Echocardiogram--    Carotid Doppler--  Bilateral: No evidence of hemodynamically significant internal carotid artery stenosis. Vertebral artery flow is antegrade.   EKG  normal sinus rhythm, RBBB.   Therapy Recommendations PT - ; OT - ; ST -   Physical Exam    Pleasant elderly caucasian male not in distress.Awake alert. Afebrile. Head is nontraumatic. Neck is supple without bruit. Hearing is diminished Cardiac exam no murmur or gallop. Lungs are clear to auscultation. Distal pulses are well felt.   Neurological Exam : Awake alert oriented x 3 normal speech and language.No face asymmetry. Tongue midline. No drift. Mild diminished fine finger movements on left. Orbits right over left upper extremity. Mild left grip weak.. Normal sensation .  Normal coordination.    ASSESSMENT John Weaver is a 78 y.o. male presenting with left hemiparesis, acute transient delirium. Work up underway. On aspirin 81 mg orally every day prior to admission. Now on clopidogrel 75 mg orally every day for secondary stroke prevention. Patient with resultant left hemiparesis.   Left hemiparesis, increasing strength in left hand.  Transient delirium, acute  Hypertension  Hyperlipidemia  Hypothyroidism  Long term medication use  Hospital day # 1  TREATMENT/PLAN  Continue clopidogrel 75 mg orally every day for secondary stroke prevention.  Risk factor modification; goal LDL < 100 in non diabetics, < 70 in diabetics  Awaiting EEG result, 2D echo, repeat CT head---pending.  Guy Franco, [facility],  MBA, MHA [facility] Pager: [phone] 02/17/2012 3:53 PM  Scribe for Dr. Delia Heady, Stroke Center Medical Director. He has personally reviewed chart, pertinent data, examined the patient and developed the plan of care. Pager:  [phone]

## 2012-02-17 NOTE — Telephone Encounter (Signed)
Dr. Lenord Fellers advised to have [name] (wife) to speak with the nurse and have her contact the hospitalist and/or the neurologist and find out why the 2nd CT Scan hasn't been ordered.  That should move the process along so that the patient can get the scan and be dc'd home.  Pt wife verbalizes understanding of instructions.

## 2012-02-17 NOTE — Progress Notes (Signed)
EEG completed as ordered no family in room

## 2012-02-17 NOTE — Progress Notes (Signed)
[name]  John Weaver WGN:[id number] DOB: 1915-05-28 DOA: 02/16/2012 PCP: Margaree Mackintosh, MD  Assessment/Plan: *TIA (transient ischemic attack) -CT on admission negative, follow up and repeat a CT as recommended per neuro (patient was pacemaker cannot have MRI). Carotid Doppler is negative for stenosis, echo done-results pending, follow. -Continue Plavix -PT OT consulted, await eval and recommendations. - Appreciate neurology assistance. Active Problems:  Hypothyroidism -Continue Synthroid  Anemia -Hemoglobin stable on admission, follow and recheck.  Aortic stenosis  -Followup on 2-D echocardiogram Chronic kidney disease -Creatinine normalized this time, follow. hypertension  -Her blood pressure controll this a.m., continue when necessary labetalol  -It is noted He was previously on lisinopril which was discontinued in the past.  Altered mental status -Likely secondary to #1, resolved. Status post Pacemaker-St.Jude for heart block  Code Status: FULL Family Communication: Directly with patient at bedside Disposition Plan: To home when medically ready   Consultants:  Neurology  Procedures:  2-D echocardiogram-pending  Carotid Dopplers-negative for ICA stenosis  Antibiotics:  NONE  HPI/Subjective: States his left upper extremity strength is back to baseline at this time, denies any new complaints.  Objective: Filed Vitals:   02/17/12 0305 02/17/12 0308 02/17/12 0621 02/17/12 1153  BP: 163/68 158/82 140/61 131/59  Pulse: 91  81 81  Temp: 98 F (36.7 C)  98.7 F (37.1 C) 97.5 F (36.4 C)  TempSrc:   Oral Oral  Resp: 18  16 18   SpO2: 98%  98% 99%    Intake/Output Summary (Last 24 hours) at 02/17/12 1248 Last data filed at 02/16/12 2256  Gross per 24 hour  Intake      0 ml  Output    250 ml  Net   -250 ml   There were no vitals filed for this visit.  Exam:   General: Elderly male, he's alert and oriented x3, in no apparent  distress  Cardiovascular: Regular rate and rhythm, normal S1-S2.  Respiratory: Clear to auscultation bilaterally no crackles or wheezes  Abdomen: Soft, bowel sounds present nontender and nondistended no organomegaly and no masses palpable  Extremities: No cyanosis and no edema  Neuro: strength 5 out of 5 and symmetric, sensory grossly intact.  Data Reviewed: Basic Metabolic Panel:  Lab 02/16/12 7846  NA 140  K 4.4  CL 101  CO2 28  GLUCOSE 89  BUN 25*  CREATININE 1.12  CALCIUM 9.9  MG --  PHOS --   Liver Function Tests:  Lab 02/16/12 1254  AST 33  ALT 19  ALKPHOS 135*  BILITOT 0.6  PROT 7.3  ALBUMIN 3.8   No results found for this basename: LIPASE:5,AMYLASE:5 in the last 168 hours No results found for this basename: AMMONIA:5 in the last 168 hours CBC:  Lab 02/16/12 1254  WBC 8.6  NEUTROABS 5.5  HGB 11.9*  HCT 35.2*  MCV 99.7  PLT 264   Cardiac Enzymes:  Lab 02/16/12 1254  CKTOTAL --  CKMB --  CKMBINDEX --  TROPONINI <0.30   BNP (last 3 results) No results found for this basename: PROBNP:3 in the last 8760 hours CBG:  Lab 02/16/12 1242  GLUCAP 79    No results found for this or any previous visit (from the past 240 hour(s)).   Studies: Ct Head Wo Contrast  02/16/2012  *RADIOLOGY REPORT*  Clinical Data: Onset of left hand weakness and numbness this morning.  CT HEAD WITHOUT CONTRAST  Technique:  Contiguous axial images were obtained from the base of the skull through  the vertex without contrast.  Comparison: 01/18/2012  Findings: The ventricles are normal in configuration.  There is ventricular and sulcal enlargement reflecting age related volume loss.  No parenchymal masses or mass effect.  There is no evidence of a recent infarct.  There are no extra-axial masses or abnormal fluid collections.  There is no intracranial hemorrhage.  The visualized sinuses and mastoid air cells are clear.  IMPRESSION: No acute intracranial abnormalities.  Age  related volume loss.   Original Report Authenticated By: Amie Portland, M.D.     Scheduled Meds:   . clopidogrel  75 mg Oral Q breakfast  . enoxaparin  40 mg Subcutaneous Q24H  . levothyroxine  100 mcg Oral QAC breakfast  . simvastatin  20 mg Oral q1800  . [DISCONTINUED] aspirin  325 mg Oral Daily   Continuous Infusions:   Active Problems:  Hypothyroidism  Anemia  Aortic stenosis  Pacemaker-St.Jude  Chronic kidney disease  Borderline systolic hypertension  TIA (transient ischemic attack)  Altered mental status    Time spent:    [facility]  [facility] Pager [phone]. If 8PM-8AM, please contact night-coverage at [email or web address], password [facility] 02/17/2012, 12:48 PM  LOS: 1 day

## 2012-02-17 NOTE — Progress Notes (Signed)
VASCULAR LAB PRELIMINARY  PRELIMINARY  PRELIMINARY  PRELIMINARY  Carotid duplex  completed.    Preliminary report:  Bilateral:  No evidence of hemodynamically significant internal carotid artery stenosis.   Vertebral artery flow is antegrade.      Keontae Levingston, RVT 02/17/2012, 11:44 AM

## 2012-02-18 DIAGNOSIS — I359 Nonrheumatic aortic valve disorder, unspecified: Secondary | ICD-10-CM

## 2012-02-18 LAB — BASIC METABOLIC PANEL
BUN: 18 mg/dL (ref 6–23)
CO2: 27 mEq/L (ref 19–32)
Chloride: 103 mEq/L (ref 96–112)
Glucose, Bld: 98 mg/dL (ref 70–99)
Potassium: 3.9 mEq/L (ref 3.5–5.1)
Sodium: 138 mEq/L (ref 135–145)

## 2012-02-18 LAB — CBC
MCH: 34.5 pg — ABNORMAL HIGH (ref 26.0–34.0)
MCV: 100 fL (ref 78.0–100.0)
Platelets: 280 10*3/uL (ref 150–400)
RBC: 3.36 MIL/uL — ABNORMAL LOW (ref 4.22–5.81)
RDW: 13 % (ref 11.5–15.5)

## 2012-02-18 MED ORDER — CLOPIDOGREL BISULFATE 75 MG PO TABS: 75.0000 mg | ORAL_TABLET | Freq: Every day | ORAL | Status: DC

## 2012-02-18 MED ORDER — LISINOPRIL 10 MG PO TABS: 10.0000 mg | ORAL_TABLET | Freq: Every day | ORAL | Status: DC

## 2012-02-18 NOTE — Progress Notes (Signed)
Patient has been seen by PT/OT with no further recommendations other than use of a walker at home. Ok for d/c home. D/c orders received. D/c instructions along with med list and scripts provided. IV d/c'd with catheter intact. Awaiting transport to main lobby for d/c home. John Weaver

## 2012-02-18 NOTE — Evaluation (Signed)
Occupational Therapy Evaluation Patient Details Name: John Weaver MRN: [id number] DOB: 1915/09/05 Today's Date: 02/18/2012 Time: 5284-1324 OT Time Calculation (min): 15 min  OT Assessment / Plan / Recommendation Clinical Impression  Pt admitted with LUE weakness, possible TIA.  Unable to perform MRI. Symptoms have now resolved. Pt at baseline. No further acute OT needs.    OT Assessment  Patient does not need any further OT services    Follow Up Recommendations  No OT follow up;Supervision/Assistance - 24 hour    Barriers to Discharge      Equipment Recommendations  None recommended by OT    Recommendations for Other Services    Frequency       Precautions / Restrictions Precautions Precautions: Fall Restrictions Weight Bearing Restrictions: No   Pertinent Vitals/Pain See vitals    ADL  Eating/Feeding: Performed;Independent Where Assessed - Eating/Feeding: Edge of bed Lower Body Dressing: Independent;Performed Where Assessed - Lower Body Dressing: Unsupported sitting Toilet Transfer: Simulated;Supervision/safety Toilet Transfer Method: Sit to Barista: Other (comment) (bed) Equipment Used: Gait belt;Rolling walker Transfers/Ambulation Related to ADLs: supervision with RW throughout room ADL Comments: Pt is at baseline.  Wife present and reports she feels pt is same as PTA and she feels comfortable returning home with pt.    OT Diagnosis:    OT Problem List:   OT Treatment Interventions:     OT Goals    Visit Information  Last OT Received On: 02/18/12 Assistance Needed: +1    Subjective Data  Subjective: "I feel much better"   Prior Functioning     Home Living Lives With: Spouse Available Help at Discharge: Family;Available 24 hours/day Type of Home: House Home Access: Level entry Home Layout: One level Bathroom Shower/Tub: Health visitor: Handicapped height Bathroom Accessibility: Yes How Accessible:  Accessible via walker Home Adaptive Equipment: Straight cane;Quad cane Prior Function Level of Independence: Independent with assistive device(s);Needs assistance Needs Assistance: Light Housekeeping;Meal Prep Meal Prep: Moderate Light Housekeeping: Moderate Able to Take Stairs?: Yes Driving: Yes Vocation: Other (comment) Archivist) Communication Communication: No difficulties Dominant Hand: Right         Vision/Perception     Cognition  Overall Cognitive Status: Appears within functional limits for tasks assessed/performed Arousal/Alertness: Awake/alert Orientation Level: Oriented X4 / Intact Behavior During Session: [facility] for tasks performed    Extremity/Trunk Assessment Right Upper Extremity Assessment RUE ROM/Strength/Tone: WFL for tasks assessed RUE Sensation: WFL - Light Touch;WFL - Proprioception RUE Coordination: WFL - gross/fine motor Left Upper Extremity Assessment LUE ROM/Strength/Tone: WFL for tasks assessed LUE Sensation: WFL - Light Touch;WFL - Proprioception LUE Coordination: WFL - gross/fine motor Right Lower Extremity Assessment RLE ROM/Strength/Tone: WFL for tasks assessed RLE Sensation: WFL - Light Touch Left Lower Extremity Assessment LLE ROM/Strength/Tone: WFL for tasks assessed LLE Sensation: WFL - Light Touch Trunk Assessment Trunk Assessment: Kyphotic     Mobility Bed Mobility Bed Mobility: Supine to Sit;Sitting - Scoot to Edge of Bed;Sit to Supine Supine to Sit: 5: Supervision Sitting - Scoot to Edge of Bed: 5: Supervision Sit to Supine: 5: Supervision Details for Bed Mobility Assistance: No cues or assist needed Transfers Transfers: Sit to Stand;Stand to Sit Sit to Stand: 5: Supervision;From bed Stand to Sit: 5: Supervision;To bed Details for Transfer Assistance: supervision for safety     Shoulder Instructions     Exercise     Balance    End of Session OT - End of Session Equipment Utilized During Treatment: Gait belt Activity  Tolerance:  Patient tolerated treatment well Patient left: in bed;with call bell/phone within reach;with family/visitor present Nurse Communication: Mobility status;Other (comment) (no further acute OT needs)  GO    02/18/2012 Cipriano Mile OTR/L Pager [phone] Office [phone]  Cipriano Mile 02/18/2012, 4:14 PM

## 2012-02-18 NOTE — Evaluation (Signed)
Physical Therapy Evaluation Patient Details Name: John Weaver MRN: [id number] DOB: [birth year] Today's Date: 02/18/2012 Time: 4098-1191 PT Time Calculation (min): 32 min  PT Assessment / Plan / Recommendation Clinical Impression  Patient is a 78 yo male admitted with LUE weakness and confusion - TIA.  Patient reports at baseline during evaluation.  Slight decrease in balance - encouraged use of RW at discharge.  Patient is getting RW from friend.  No further PT needs identified.    PT Assessment  Patent does not need any further PT services    Follow Up Recommendations  No PT follow up;Supervision for mobility/OOB    Does the patient have the potential to tolerate intense rehabilitation      Barriers to Discharge        Equipment Recommendations  None recommended by PT (Patient reports he is getting RW from friend)    Recommendations for Other Services     Frequency      Precautions / Restrictions Precautions Precautions: Fall Restrictions Weight Bearing Restrictions: No   Pertinent Vitals/Pain       Mobility  Bed Mobility Bed Mobility: Supine to Sit;Sit to Supine Supine to Sit: 5: Supervision;HOB elevated Sit to Supine: 5: Supervision;HOB elevated Details for Bed Mobility Assistance: No cues or assist needed Transfers Transfers: Sit to Stand;Stand to Sit Sit to Stand: 4: Min guard;With upper extremity assist;From bed Stand to Sit: 5: Supervision;With upper extremity assist;To bed Details for Transfer Assistance: Verbal cues for hand placement.  Slight decrease in balance initially in standing - leaning posteriorly.  Improved with increased gait. Ambulation/Gait Ambulation/Gait Assistance: 5: Supervision Ambulation Distance (Feet): 220 Feet Assistive device: Rolling walker Ambulation/Gait Assistance Details: Verbal cues to stay close to RW and stand upright during gait. Gait Pattern: Step-through pattern;Decreased stride length;Trunk flexed Gait velocity: Slow  gait speed Modified Rankin (Stroke Patients Only) Pre-Morbid Rankin Score: Slight disability Modified Rankin: Slight disability      PT Goals  N/A  Visit Information  Last PT Received On: 02/18/12 Assistance Needed: +1    Subjective Data  Subjective: "I am getting a walker from a friend" Patient Stated Goal: To go home today   Prior Functioning  Home Living Lives With: Spouse Available Help at Discharge: Family;Available 24 hours/day Type of Home: House (Condo) Home Access: Level entry Home Layout: One level Bathroom Shower/Tub: Health visitor: Standard Bathroom Accessibility: Yes How Accessible: Accessible via walker Home Adaptive Equipment: Straight cane;Quad cane (Access to RW) Prior Function Level of Independence: Independent with assistive device(s);Needs assistance Needs Assistance: Light Housekeeping;Meal Prep Meal Prep: Moderate Light Housekeeping: Moderate Able to Take Stairs?: Yes Driving: Yes Vocation:  Presenter, broadcasting) Communication Communication: No difficulties Dominant Hand: Right    Cognition  Overall Cognitive Status: Appears within functional limits for tasks assessed/performed Arousal/Alertness: Awake/alert Orientation Level: Oriented X4 / Intact Behavior During Session: [facility] for tasks performed    Extremity/Trunk Assessment Right Upper Extremity Assessment RUE ROM/Strength/Tone: WFL for tasks assessed RUE Sensation: WFL - Light Touch Left Upper Extremity Assessment LUE ROM/Strength/Tone: WFL for tasks assessed LUE Sensation: WFL - Light Touch LUE Coordination: WFL - gross motor Right Lower Extremity Assessment RLE ROM/Strength/Tone: WFL for tasks assessed RLE Sensation: WFL - Light Touch Left Lower Extremity Assessment LLE ROM/Strength/Tone: WFL for tasks assessed LLE Sensation: WFL - Light Touch Trunk Assessment Trunk Assessment: Kyphotic   Balance Balance Balance Assessed: Yes High Level Balance High Level Balance  Activites: Head turns;Turns High Level Balance Comments: Slight decrease in balance during high level  balance activities without assistive device.  No loss of balance while using RW.  End of Session PT - End of Session Equipment Utilized During Treatment: Gait belt Activity Tolerance: Patient tolerated treatment well Patient left: in bed;with call bell/phone within reach;with bed alarm set Nurse Communication: Mobility status  GP     Vena Austria 02/18/2012, 12:31 PM  Durenda Hurt. Renaldo Fiddler, [facility] Acute Rehab Services Pager [phone]

## 2012-02-18 NOTE — Progress Notes (Signed)
Stroke Team Progress Note  HISTORY  John Weaver is an 78 y.o. male who was LKW at 0830 today. He awoke like normal with his wife and ate breakfast. She left to work and he was working to put up a picture and he says that his dog was barking and was annoying him and he decided to sit down at 0830. The next thing he remembers is his wife waking him up at 0945. She says that he was confused and thought that he had not eaten breakfast, but cleared within minutes. He states that he was aware that he was confused. No history of seizures. No loss of urine, tongue biting. He went to see his local physician, Dr. Lenord Fellers and he is now being admitted for workup. Wife states that his left hand grip has improved. Was on baby aspirin PTA.  He had a PPM placed during summer for heart block and CANNOT HAVE MRI.   LSN: [id number]  tPA Given: No: out of window, symptoms resolving  Patient was not a TPA candidate secondary to symptoms resolving and out of window time. He was admitted to the neuro floor for further evaluation and treatment.  SUBJECTIVE No family is at bedside. The patient feels well, and he was walking with a walker today.   OBJECTIVE Most recent Vital Signs: Filed Vitals:   02/17/12 2200 02/18/12 0253 02/18/12 0542 02/18/12 1106  BP: 134/51 108/38 122/45 147/62  Pulse: 79 74 72 72  Temp: 97.2 F (36.2 C) 98.1 F (36.7 C) 97.3 F (36.3 C) 97.4 F (36.3 C)  TempSrc: Oral Oral Oral Oral  Resp: 20 20 20 20   Height:      Weight:      SpO2: 97% 95% 99% 99%   CBG (last 3)   Basename 02/17/12 2059 02/16/12 1242  GLUCAP 117* 79    IV Fluid Intake:     MEDICATIONS     . clopidogrel  75 mg Oral Q breakfast  . enoxaparin  40 mg Subcutaneous Q24H  . levothyroxine  100 mcg Oral QAC breakfast  . polyethylene glycol  17 g Oral Daily  . simvastatin  20 mg Oral q1800   PRN:  acetaminophen, labetalol  Diet:  General thin liquids Activity:  As tolerated DVT Prophylaxis:   lovenox  CLINICALLY SIGNIFICANT STUDIES Basic Metabolic Panel:   Lab 02/18/12 0745 02/16/12 1254  NA 138 140  K 3.9 4.4  CL 103 101  CO2 27 28  GLUCOSE 98 89  BUN 18 25*  CREATININE 1.01 1.12  CALCIUM 9.6 9.9  MG -- --  PHOS -- --   Liver Function Tests:   Lab 02/16/12 1254  AST 33  ALT 19  ALKPHOS 135*  BILITOT 0.6  PROT 7.3  ALBUMIN 3.8   CBC:   Lab 02/18/12 0745 02/16/12 1254  WBC 9.6 8.6  NEUTROABS -- 5.5  HGB 11.6* 11.9*  HCT 33.6* 35.2*  MCV 100.0 99.7  PLT 280 264   Coagulation:   Lab 02/16/12 1315  LABPROT 13.7  INR 1.06   Cardiac Enzymes:   Lab 02/16/12 1254  CKTOTAL --  CKMB --  CKMBINDEX --  TROPONINI <0.30    Lipid Panel    Component Value Date/Time   CHOL 127 02/17/2012 0450   TRIG 53 02/17/2012 0450   HDL 53 02/17/2012 0450   CHOLHDL 2.4 02/17/2012 0450   VLDL 11 02/17/2012 0450   LDLCALC 63 02/17/2012 0450   HgbA1C--- in progress  Urine Drug Screen:  Component Value Date/Time   LABOPIA NONE DETECTED 02/16/2012 2251   COCAINSCRNUR NONE DETECTED 02/16/2012 2251   LABBENZ NONE DETECTED 02/16/2012 2251   AMPHETMU NONE DETECTED 02/16/2012 2251   THCU NONE DETECTED 02/16/2012 2251   LABBARB NONE DETECTED 02/16/2012 2251     Ct Head Wo Contrast 02/16/2012  No acute intracranial abnormalities.  Age related volume loss.     02/17/2012   IMPRESSION:  No acute intracranial findings. No visible cortical or subcortical  hypodensity to correlate with the patient's left hand weakness.  MRI is not possible on this patient with pacemaker. No visible  intracranial hemorrhage.   MRI/MRI of the brain ---PATIENT HAS PPM   EEG---  2D Echocardiogram--   Study Conclusions  - Left ventricle: The cavity size was normal. Systolic function was normal. The estimated ejection fraction was in the range of 55% to 60%. Wall motion was normal; there were no regional wall motion abnormalities. There was an increased relative contribution  of atrial contraction to ventricular filling. Doppler parameters are consistent with abnormal left ventricular relaxation (grade 1 diastolic dysfunction). - Aortic valve: Severe thickening and calcification. There was mild to moderate stenosis. Mild regurgitation. Valve area: 1.17cm^2(VTI). Valve area: 1.12cm^2 (Vmax). - Mitral valve: Mild regurgitation. - Atrial septum: No defect or patent foramen ovale was identified. - Pulmonary arteries: PA peak pressure: 34mm Hg (S).     Carotid Doppler--  Bilateral: No evidence of hemodynamically significant internal carotid artery stenosis. Vertebral artery flow is antegrade.   EKG   SINUS BRADYCARDIA ~ V-rate< 91 FIRST DEGREE AV BLOCK ~ PR >150, V-rate 50- 90 RIGHT BUNDLE BRANCH BLOCK ~ QRSd > 84, RSR' or pure R or QR  Therapy Recommendations PT - ; OT - ; ST -   Physical Exam    The patient is alert and cooperative.  Neurologic exam reveals full extraocular movements, speech is normal. Visual fields are full.  Motor testing reveals good strength of all four extremities.  The patient has good finger-nose-finger and heel-to-shin bilaterally. Gait was not tested.  Deep tendon reflexes are symmetric and normal. Toes are down going bilaterally.    ASSESSMENT John Weaver is a 78 y.o. male presenting with left hemiparesis, acute transient delirium. Work up underway. On aspirin 81 mg orally every day prior to admission. Now on clopidogrel 75 mg orally every day for secondary stroke prevention. Patient with resultant left hemiparesis.   Left hemiparesis, increasing strength in left hand.  Transient delirium, acute  Hypertension  Hyperlipidemia  Hypothyroidism  Long term medication use  Hospital day # 2  The patient suffered some left-sided weakness that is now resolved. The patient is up walking with a walker, and he feels stable. The patient lives at home with his wife. The patient was on 81 mg of aspirin daily prior to  admission. Stroke workup been completed. The patient has a moderate degree of aortic stenosis. The patient currently is on Plavix. No acute stroke was seen on repeat CT scan of the brain.    TREATMENT/PLAN  Risk factor modification; goal LDL < 100 in non diabetics, < 70 in diabetics -Continue Plavix for now -PT eval reccommended no PT followup -Followup with Dr. Pearlean Brownie in 4-6 weeks -Stroke team will sign off.     02/18/2012 12:28 PM  Lesly Dukes  Pager:  [phone]

## 2012-02-18 NOTE — Discharge Summary (Addendum)
Physician Discharge Summary  John Weaver ZOX:[id number] DOB: 1915-09-15 DOA: 02/16/2012  PCP: Margaree Mackintosh, MD  Admit date: 02/16/2012 Discharge date: 02/18/2012  Time spent: >30 minutes  Recommendations for Outpatient Follow-up:      Follow-up Information    Follow up with Margaree Mackintosh, MD. (in 1-2weeks, call for appt upon discharge)    Contact information:   [address] [facility] [address] [phone]       Follow up with Gates Rigg, MD. (in 4-6weeks, call for appt)    Contact information:   [address] 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 Apparently was sitting at his desk. May have fallen asleep. Seemed that he could have possibly been confused just a bit. Recently was started on Vicodin for hip osteoarthritis. Numbness and weakness is gradually improved over the course of the day. He has a history of pacemaker insertion this past summer. He is ambulatory. He is speaking without dysarthria. No facial weakness noted. 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 He is right-handed. Wife has noted bruising left lower extremity. He denies any recent fall. He used to have syncopal episodes prior to pacemaker placement.Marland Kitchen  He takes a baby aspirin everyday and took one this morning. He denies any previous history of CVA, atrial fibrillation.  He recently received a pacemaker per Dr. Graciela Husbands June  2013   Hospital Course:  *TIA (transient ischemic attack)  -CT on admission negative, the followup CT scan done on 11/15 also came back negative. (patient was pacemaker cannot have MRI). Carotid Doppler is negative for stenosis, echo done-showed and EF of 55-60% with normal wall motion. Mild to moderate aortic stenosis was noted. It was noted that he had been on aspirin prior to admission, and he was changed to Plavix which she is to continue upon discharge. The aspirin was discontinued. PT OT was consulted, PT saw patient states that his at baseline but needs to use his walker that he already has at home. He to followup outpatient with his PCP and neurology as directed. Active Problems:  Hypothyroidism  -Continue Synthroid  Anemia  -Hemoglobin stable on admission, follow and recheck.  Aortic stenosis  -Mild to moderate as above per echo. His to follow up outpatient. Chronic kidney disease  -Creatinine normalized this time, follow up with PCP.  hypertension  -Patient's blood pressure on admission was elevated in the 1 ED his, and it was noted that he has a history of hypertension and had previously been on lisinopril but it had been discontinued. She was placed on when necessary labetalol in the hospital. He has been restarted on lisinopril on discharge and his to followup with his PCP further monitoring and adjustment of his meds as clinically appropriate for optimal blood pressure control.   Altered mental status  -Likely secondary to #1, resolved.  Status post Pacemaker-St.Jude for heart block  Procedures:  EEG-done 11/15, normal -no epileptiform activity per report from Dr. Roseanne Reno  2-D echocardiogram  Study Conclusions  - Left ventricle: The cavity size was normal. Systolic function was normal. The estimated ejection fraction was in the range of 55% to 60%. Wall motion was normal; there were no regional wall motion abnormalities. There was an increased relative contribution of  atrial contraction to ventricular filling. Doppler parameters are consistent with abnormal left ventricular relaxation (grade 1 diastolic dysfunction). - Aortic valve: Severe thickening and calcification. There was mild to moderate stenosis. Mild regurgitation. Valve area: 1.17cm^2(VTI). Valve area: 1.12cm^2 (Vmax). - Mitral valve: Mild regurgitation.    Consultations:  Neurology  Discharge Exam: Filed Vitals:   02/17/12 2200 02/18/12 0253 02/18/12 0542 02/18/12 1106  BP: 134/51 108/38 122/45 147/62  Pulse: 79 74 72 72  Temp: 97.2 F (36.2 C) 98.1 F (36.7 C) 97.3 F (36.3 C) 97.4 F (36.3 C)  TempSrc: Oral Oral Oral Oral  Resp: 20 20 20 20   Height:      Weight:      SpO2: 97% 95% 99% 99%   Exam:  General: Elderly male, he's alert and oriented x3, in no apparent distress  Cardiovascular: Regular rate and rhythm, normal S1-S2.  Respiratory: Clear to auscultation bilaterally no crackles or wheezes  Abdomen: Soft, bowel sounds present nontender and nondistended no organomegaly and no masses palpable  Extremities: No cyanosis and no edema  Neuro: strength 5 out of 5 and symmetric, sensory grossly intact.  Discharge Instructions  Discharge Orders    Future Appointments: Provider: Department: Dept Phone: Center:   04/05/2012 10:00 AM Margaree Mackintosh, MD Sharlet Salina, MD [phone] [facility]     Future Orders Please Complete By Expires   Diet - low sodium heart healthy      Increase activity slowly          Medication List     As of 02/18/2012 12:36 PM    STOP taking these medications         aspirin EC 81 MG tablet      TAKE these medications         acetaminophen 325 MG tablet   Commonly known as: TYLENOL   Take 325 mg by mouth every 6 (six) hours as needed. For pain      clopidogrel 75 MG tablet   Commonly known as: PLAVIX   Take 1 tablet (75 mg total) by mouth daily with breakfast.      EX-LAX 15 MG Tabs   Generic drug: Sennosides   Take 15 mg by mouth  at bedtime as needed. For constipation      HYDROcodone-acetaminophen 5-500 MG per tablet   Commonly known as: VICODIN   Take 1 tablet by mouth 2 (two) times daily as needed. For pain      levothyroxine 100 MCG tablet   Commonly known as: SYNTHROID, LEVOTHROID   Take 100 mcg by mouth daily.      multivitamins ther. w/minerals Tabs   Take 1 tablet by mouth daily.      pravastatin 40 MG tablet   Commonly known as: PRAVACHOL   Take 40 mg by mouth at bedtime.      terbinafine 1 % cream   Commonly known as: LAMISIL   Apply 1 application topically 2 (two) times daily as needed. For athletes foot      TYLENOL PM EXTRA STRENGTH PO   Take 1-2 tablets by mouth at bedtime as needed. For sleep      vitamin C 1000 MG tablet   Take 1,000  mg by mouth daily.      Lisinopril 10 mg take 1 tablet by mouth daily      Follow-up Information    Follow up with Margaree Mackintosh, MD. (in 1-2weeks, call for appt upon discharge)    Contact information:   [address] [facility] [address] [phone]       Follow up with Gates Rigg, MD. (in 4-6weeks, call for appt)    Contact information:   [address], [address] [facility] [place] [address] [phone]           The results of significant diagnostics from this hospitalization (including imaging, microbiology, ancillary and laboratory) are listed below for reference.    Significant Diagnostic Studies: Ct Head Wo Contrast  02/17/2012  *RADIOLOGY REPORT*  Clinical Data: History of mini strokes.  History of aortic stenosis, history of altered mental status. Left hand weakness.  CT HEAD WITHOUT CONTRAST  Technique:  Contiguous axial images were obtained from the base of the skull through the vertex without contrast.  Comparison: 02/16/2012.  Also 01/18/2012.  Findings: There is no evidence for acute infarction, intracranial hemorrhage, mass lesion, hydrocephalus, or extra-axial fluid.  Mild to moderate  atrophy with chronic microvascular ischemic change.  No visible large vessel stroke. Tiny right thalamic lacune. Moderate vascular calcification.  No sinus or mastoid disease.  No change from priors.  IMPRESSION: No acute intracranial findings.  No visible cortical or subcortical hypodensity to correlate with the patient's left hand weakness. MRI is not possible on this patient with pacemaker.  No visible intracranial hemorrhage.   Original Report Authenticated By: Davonna Belling, M.D.    Ct Head Wo Contrast  02/16/2012  *RADIOLOGY REPORT*  Clinical Data: Onset of left hand weakness and numbness this morning.  CT HEAD WITHOUT CONTRAST  Technique:  Contiguous axial images were obtained from the base of the skull through the vertex without contrast.  Comparison: 01/18/2012  Findings: The ventricles are normal in configuration.  There is ventricular and sulcal enlargement reflecting age related volume loss.  No parenchymal masses or mass effect.  There is no evidence of a recent infarct.  There are no extra-axial masses or abnormal fluid collections.  There is no intracranial hemorrhage.  The visualized sinuses and mastoid air cells are clear.  IMPRESSION: No acute intracranial abnormalities.  Age related volume loss.   Original Report Authenticated By: Amie Portland, M.D.     Microbiology: No results found for this or any previous visit (from the past 240 hour(s)).   Labs: Basic Metabolic Panel:  Lab 02/18/12 2130 02/16/12 1254  NA 138 140  K 3.9 4.4  CL 103 101  CO2 27 28  GLUCOSE 98 89  BUN 18 25*  CREATININE 1.01 1.12  CALCIUM 9.6 9.9  MG -- --  PHOS -- --   Liver Function Tests:  Lab 02/16/12 1254  AST 33  ALT 19  ALKPHOS 135*  BILITOT 0.6  PROT 7.3  ALBUMIN 3.8   No results found for this basename: LIPASE:5,AMYLASE:5 in the last 168 hours No results found for this basename: AMMONIA:5 in the last 168 hours CBC:  Lab 02/18/12 0745 02/16/12 1254  WBC 9.6 8.6  NEUTROABS -- 5.5    HGB 11.6* 11.9*  HCT 33.6* 35.2*  MCV 100.0 99.7  PLT 280 264   Cardiac Enzymes:  Lab 02/16/12 1254  CKTOTAL --  CKMB --  CKMBINDEX --  TROPONINI <0.30   BNP: BNP (last 3 results) No results  found for this basename: PROBNP:3 in the last 8760 hours CBG:  Lab 02/17/12 2059 02/16/12 1242  GLUCAP 117* 79       Signed:  Klayten Jolliff C  [facility] 02/18/2012, 12:36 PM

## 2012-02-19 NOTE — Care Management Note (Signed)
    Page 1 of 1   02/19/2012     10:22:43 AM   CARE MANAGEMENT NOTE 02/19/2012  Patient:  John Weaver, John Weaver   Account Number:  [id number]  Date Initiated:  02/17/2012  Documentation initiated by:  Jacquelynn Cree  Subjective/Objective Assessment:   Admitted with TIA     Action/Plan:   PT/OT evals- no follow recommended   Anticipated DC Date:  02/18/2012   Anticipated DC Plan:  HOME/SELF CARE      DC Planning Services  CM consult      Choice offered to / List presented to:             Status of service:  Completed, signed off Medicare Important Message given?   (If response is "NO", the following Medicare IM given date fields will be blank) Date Medicare IM given:   Date Additional Medicare IM given:    Discharge Disposition:  HOME/SELF CARE  Per UR Regulation:  Reviewed for med. necessity/level of care/duration of stay  If discussed at Long Length of Stay Meetings, dates discussed:    Comments:

## 2012-02-24 NOTE — Procedures (Signed)
EEG NUMBER:  John Millin, MD  HISTORY:  A 78 year old male with episode of confusion.  MEDICATIONS:  Tylenol, vitamin C, aspirin, Vicodin, Synthroid, Pravachol, Lamisil.  CONDITIONS OF RECORDING:  This is a 16-channel EEG carried out with the patient in the awake, drowsy, and asleep states.  DESCRIPTION:  The waking background activity consists of a low-voltage, symmetrical, fairly well-organized 9 to 10 Hz alpha activity seen from the parieto-occipital and posterotemporal regions.  Low voltage, fast activity, poorly organized was seen anteriorly, and at times, superimposed on more posterior rhythms.  A mixture of theta and alpha was seen from these central and temporal regions.  The patient drowses with slowing to irregular, low-voltage theta and beta activity.  The patient goes into a light sleep with symmetrical sleep spindles, vertex with sharp activity, and irregular slow activity.  Hyperventilation was not performed.  Intermittent photic stimulation failed to elicit any change in the tracing.  IMPRESSION:  This is a normal electroencephalogram.          ______________________________ Thana Farr, MD    HQ:IONG D:  02/23/2012 18:49:12  T:  02/24/2012 13:45:34  Job #:  [id number]

## 2012-02-24 NOTE — Procedures (Signed)
REFERRING PHYSICIAN:  Kela Millin, MD  INDICATION FOR STUDY:  A 78 year old man who had transient weakness and numbness involving his left upper extremity as well as transient altered mental status.  Study is being performed to rule out possible new onset focal seizure disorder.  DESCRIPTION:  This is a routine EEG recording performed during wakefulness and during drowsiness.  Predominant background activity during wakefulness consisted of 9 Hz symmetrical alpha rhythm, which attenuated well with eye opening.  Photic stimulation produced a symmetrical occipital driving response.  Hyperventilation was not performed.  During brief periods of drowsiness, there was mild to moderate slowing of background activity with mixed irregular delta and theta activity diffusely and symmetrically.  No epileptiform discharges were recorded.  There were no areas of disproportionate focal slowing.  INTERPRETATION:  This is a normal EEG recording during wakefulness and during drowsiness.  No evidence of an epileptic disorder was demonstrated.     Noel Christmas, MD    ZO:XWRU D:  02/17/2012 10:26:07  T:  02/18/2012 03:07:21  Job #:  [id number]

## 2012-02-28 ENCOUNTER — Encounter (HOSPITAL_COMMUNITY): Payer: Self-pay | Admitting: *Deleted

## 2012-02-28 ENCOUNTER — Emergency Department (HOSPITAL_COMMUNITY)
Admission: EM | Admit: 2012-02-28 | Discharge: 2012-02-29 | Disposition: A | Discharge status: Home / Self Care | Payer: Medicare Other | Attending: Emergency Medicine | Admitting: Emergency Medicine

## 2012-02-28 DIAGNOSIS — R55 Syncope and collapse: Secondary | ICD-10-CM | POA: Insufficient documentation

## 2012-02-28 DIAGNOSIS — W2203XA Walked into furniture, initial encounter: Secondary | ICD-10-CM | POA: Insufficient documentation

## 2012-02-28 DIAGNOSIS — Z23 Encounter for immunization: Secondary | ICD-10-CM | POA: Insufficient documentation

## 2012-02-28 DIAGNOSIS — Z7901 Long term (current) use of anticoagulants: Secondary | ICD-10-CM | POA: Insufficient documentation

## 2012-02-28 DIAGNOSIS — Z87891 Personal history of nicotine dependence: Secondary | ICD-10-CM | POA: Insufficient documentation

## 2012-02-28 DIAGNOSIS — Z8673 Personal history of transient ischemic attack (TIA), and cerebral infarction without residual deficits: Secondary | ICD-10-CM | POA: Insufficient documentation

## 2012-02-28 DIAGNOSIS — S0101XA Laceration without foreign body of scalp, initial encounter: Secondary | ICD-10-CM

## 2012-02-28 DIAGNOSIS — Z79899 Other long term (current) drug therapy: Secondary | ICD-10-CM | POA: Insufficient documentation

## 2012-02-28 DIAGNOSIS — S0100XA Unspecified open wound of scalp, initial encounter: Secondary | ICD-10-CM | POA: Insufficient documentation

## 2012-02-28 DIAGNOSIS — E039 Hypothyroidism, unspecified: Secondary | ICD-10-CM | POA: Insufficient documentation

## 2012-02-28 DIAGNOSIS — E7800 Pure hypercholesterolemia, unspecified: Secondary | ICD-10-CM | POA: Insufficient documentation

## 2012-02-28 DIAGNOSIS — Z87442 Personal history of urinary calculi: Secondary | ICD-10-CM | POA: Insufficient documentation

## 2012-02-28 DIAGNOSIS — I10 Essential (primary) hypertension: Secondary | ICD-10-CM | POA: Insufficient documentation

## 2012-02-28 DIAGNOSIS — Y9289 Other specified places as the place of occurrence of the external cause: Secondary | ICD-10-CM | POA: Insufficient documentation

## 2012-02-28 DIAGNOSIS — Y939 Activity, unspecified: Secondary | ICD-10-CM | POA: Insufficient documentation

## 2012-02-28 HISTORY — DX: Cerebral infarction, unspecified: I63.9

## 2012-02-28 LAB — CBC WITH DIFFERENTIAL/PLATELET
Basophils Absolute: 0 10*3/uL (ref 0.0–0.1)
Basophils Relative: 0 % (ref 0–1)
HCT: 34.6 % — ABNORMAL LOW (ref 39.0–52.0)
Hemoglobin: 11.8 g/dL — ABNORMAL LOW (ref 13.0–17.0)
Lymphocytes Relative: 20 % (ref 12–46)
Monocytes Absolute: 0.6 10*3/uL (ref 0.1–1.0)
Monocytes Relative: 6 % (ref 3–12)
Neutro Abs: 7.7 10*3/uL (ref 1.7–7.7)
Neutrophils Relative %: 73 % (ref 43–77)
WBC: 10.6 10*3/uL — ABNORMAL HIGH (ref 4.0–10.5)

## 2012-02-28 LAB — POCT I-STAT, CHEM 8
BUN: 30 mg/dL — ABNORMAL HIGH (ref 6–23)
Calcium, Ion: 1.23 mmol/L (ref 1.13–1.30)
Chloride: 104 mEq/L (ref 96–112)
HCT: 36 % — ABNORMAL LOW (ref 39.0–52.0)
Sodium: 140 mEq/L (ref 135–145)
TCO2: 26 mmol/L (ref 0–100)

## 2012-02-28 LAB — PROTIME-INR: INR: 0.98 (ref 0.00–1.49)

## 2012-02-28 LAB — APTT: aPTT: 32 seconds (ref 24–37)

## 2012-02-28 NOTE — ED Notes (Addendum)
Pt to ED after fall.  AO x 4.  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  1 inch lac to R occiput with hematoma.  Pt on plavix.  Pt not sure if he passed out or not.  Pt states pupils are always different sizes.  Pt states he was tx here several weeks ago for a mini-stroke.  No deficits.

## 2012-02-29 ENCOUNTER — Emergency Department (HOSPITAL_COMMUNITY): Payer: Medicare Other

## 2012-02-29 ENCOUNTER — Encounter (HOSPITAL_COMMUNITY): Payer: Self-pay | Admitting: Radiology

## 2012-02-29 ENCOUNTER — Telehealth: Payer: Self-pay | Admitting: Internal Medicine

## 2012-02-29 MED ORDER — TETANUS-DIPHTH-ACELL PERTUSSIS 5-2.5-18.5 LF-MCG/0.5 IM SUSP
0.5000 mL | Freq: Once | INTRAMUSCULAR | Status: AC
  Administered 2012-02-29: 0.5 mL via INTRAMUSCULAR
  Filled 2012-02-29: qty 0.5

## 2012-02-29 NOTE — ED Notes (Signed)
Patient transported to CT 

## 2012-02-29 NOTE — ED Notes (Signed)
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 States he hit front of head on an easel and hit back of head on chair. Pt briefly lost consciousness. Pt has a laceration to forehead approximately an inch in length with minimal bleeding. Pt also has a laceration to the occipital approximately an inch in length with minimal bleeding at this time. Pt rates head pain 2/10. Pt is on Plavix and reports having a TIA a few weeks ago, but thinks he did not have a stroke this time. Pt alert and oriented x 4, neuro intact.

## 2012-02-29 NOTE — ED Provider Notes (Addendum)
History     CSN: [id number]  Arrival date & time 02/28/12  2328   First MD Initiated Contact with Patient 02/28/12 2349      Chief Complaint  Patient presents with  . Fall    on plavix    (Consider location/radiation/quality/duration/timing/severity/associated sxs/prior treatment) Patient is a 78 y.o. male presenting with fall. The history is provided by the patient and the spouse.  Fall Pertinent negatives include no nausea, no vomiting and no headaches.   78 year old, male, with a history of a TIA.  Presents emergency department after he fell.  This evening.  He was working with his camera trying to take a picture.  He looked up and then, he fell backwards and struck his head.  He did not stumble over his feet.  He did not have a loss of consciousness.  His family came to side and helped her get up.  He was completely alert.  Upon their arrival, which was on the medial after he fell.  He denies pain anywhere.  He denies a headache, neck pain, back pain, chest pain, abdominal pain.  He denies weakness, or paresthesias.  He denies vision changes, nausea, or vomiting.  He takes Plavix.  He denies recent illness.  He does not know when he had his last tetanus shot.  He has a known cardiac murmur.  His wife states that he had a recent echocardiogram, which they do not think was significant.  Because they were not told that anything further needed to be done.  Past Medical History  Diagnosis Date  . Back pain   . Hypertension   . Hypercholesteremia   . Kidney stones   . Hypothyroidism   . Stroke 02/2012    tia    Past Surgical History  Procedure Date  . Knee surgery     left knee  . Joint replacement     Left knee replacement    Family History  Problem Relation Age of Onset  . Kidney disease Mother   . Kidney disease Father     History  Substance Use Topics  . Smoking status: Former Smoker    Types: Pipe  . Smokeless tobacco: Not on file  . Alcohol Use: 1.2 oz/week   2 Cans of beer per week      Review of Systems  HENT: Negative for neck pain.   Eyes: Negative for visual disturbance.  Respiratory: Negative for chest tightness.   Cardiovascular: Negative for chest pain and palpitations.  Gastrointestinal: Negative for nausea and vomiting.  Musculoskeletal: Negative for back pain.  Neurological: Positive for syncope. Negative for headaches.  Hematological: Does not bruise/bleed easily.  Psychiatric/Behavioral: Negative for confusion.  All other systems reviewed and are negative.    Allergies  Sulfa antibiotics  Home Medications   Current Outpatient Rx  Name  Route  Sig  Dispense  Refill  . ACETAMINOPHEN 325 MG PO TABS   Oral   Take 325 mg by mouth every 6 (six) hours as needed. For pain         . VITAMIN C 1000 MG PO TABS   Oral   Take 1,000 mg by mouth daily.         Marland Kitchen CLOPIDOGREL BISULFATE 75 MG PO TABS   Oral   Take 1 tablet (75 mg total) by mouth daily with breakfast.   30 tablet   0   . TYLENOL PM EXTRA STRENGTH PO   Oral   Take 1-2 tablets by mouth  at bedtime as needed. For sleep         . HYDROCODONE-ACETAMINOPHEN 5-500 MG PO TABS   Oral   Take 1 tablet by mouth 2 (two) times daily as needed. For pain         . LEVOTHYROXINE SODIUM 100 MCG PO TABS   Oral   Take 100 mcg by mouth daily.         Marland Kitchen LISINOPRIL 10 MG PO TABS   Oral   Take 1 tablet (10 mg total) by mouth daily.   30 tablet   0   . THERA M PLUS PO TABS   Oral   Take 1 tablet by mouth daily.         Marland Kitchen PRAVASTATIN SODIUM 40 MG PO TABS   Oral   Take 40 mg by mouth at bedtime.         . SENNOSIDES 15 MG PO TABS   Oral   Take 15 mg by mouth at bedtime as needed. For constipation         . TERBINAFINE HCL 1 % EX CREA   Topical   Apply 1 application topically 2 (two) times daily as needed. For athletes foot           BP 161/72  Pulse 95  Temp 97.9 F (36.6 C) (Oral)  Resp 18  SpO2 99%  Physical Exam  Nursing note and  vitals reviewed. Constitutional: He is oriented to person, place, and time. He appears well-developed and well-nourished. No distress.  HENT:  Head: Normocephalic.       One and half centimeter occipital laceration without active bleeding.  No scalp contusion.  Eyes: Conjunctivae normal and EOM are normal.  Neck: Normal range of motion. Neck supple.       No neck tenderness  Cardiovascular: Normal rate, regular rhythm and intact distal pulses.   Murmur heard. Pulmonary/Chest: Effort normal and breath sounds normal. No respiratory distress. He has no wheezes. He has no rales. He exhibits no tenderness.  Abdominal: Soft. Bowel sounds are normal. He exhibits no distension. There is no tenderness.  Musculoskeletal: Normal range of motion. He exhibits no edema and no tenderness.  Neurological: He is alert and oriented to person, place, and time. No cranial nerve deficit.  Skin: Skin is warm and dry. No rash noted. No erythema.       One and half centimeter occipital laceration, with no active bleeding or  Psychiatric: He has a normal mood and affect. Thought content normal.    ED Course  Procedures (including critical care time) 78 year old, male, had a syncopal episode.  He denies pain now.  He has a small occipital laceration, but no other signs of significant injury.  He hasn't cardiac murmur.  However, recent echocardiogram has been performed, and apparently, did not show significant stenosis.  We will do a CAT scan of his head.  Update his tetanus shot and close the wound with staples.  Labs Reviewed  CBC WITH DIFFERENTIAL - Abnormal; Notable for the following:    WBC 10.6 (*)     RBC 3.48 (*)     Hemoglobin 11.8 (*)     HCT 34.6 (*)     All other components within normal limits  POCT I-STAT, CHEM 8 - Abnormal; Notable for the following:    BUN 30 (*)     Creatinine, Ser 1.50 (*)     Glucose, Bld 107 (*)     Hemoglobin 12.2 (*)  HCT 36.0 (*)     All other components within  normal limits  PROTIME-INR  APTT   No results found.   No diagnosis found.  Procedure Scalp lac Wound cleaned with NS Closed with single staple.  No complications No anesthesia.  MDM  Occipital laceration closed with one staple Syncope        Cheri Guppy, MD 02/29/12 0022  Cheri Guppy, MD 02/29/12 4540

## 2012-03-05 ENCOUNTER — Ambulatory Visit (INDEPENDENT_AMBULATORY_CARE_PROVIDER_SITE_OTHER): Payer: Medicare Other | Admitting: Internal Medicine

## 2012-03-05 ENCOUNTER — Encounter: Payer: Self-pay | Admitting: Internal Medicine

## 2012-03-05 VITALS — BP 108/66 | HR 72 | Temp 97.9°F | Wt 131.0 lb

## 2012-03-05 DIAGNOSIS — Z8673 Personal history of transient ischemic attack (TIA), and cerebral infarction without residual deficits: Secondary | ICD-10-CM

## 2012-03-05 DIAGNOSIS — S0101XA Laceration without foreign body of scalp, initial encounter: Secondary | ICD-10-CM

## 2012-03-05 DIAGNOSIS — Y92009 Unspecified place in unspecified non-institutional (private) residence as the place of occurrence of the external cause: Secondary | ICD-10-CM

## 2012-03-05 DIAGNOSIS — I350 Nonrheumatic aortic (valve) stenosis: Secondary | ICD-10-CM

## 2012-03-05 DIAGNOSIS — I359 Nonrheumatic aortic valve disorder, unspecified: Secondary | ICD-10-CM

## 2012-03-05 NOTE — Telephone Encounter (Signed)
Pt seen on 12/2 and staple removed.

## 2012-03-05 NOTE — Patient Instructions (Addendum)
Please try physical therapy for gait strengthening. Keep followup appointment after first of the year

## 2012-03-05 NOTE — Progress Notes (Addendum)
  Subjective:    Patient ID: John Weaver, male    DOB: January 22, 1916, 78 y.o.   MRN: [id number]  HPI  78 year-old [place] male with history of recent TIA started on Plavix after evaluation in hospital. Patient and wife were entertaining friends at their home in [place], [place]  on November 26. He went to get a camera to take some pictures. He was looking at his camera , turned to the left, and had loss of balance. He does remember losing his balance but does not remember the actual fall. He struck the right occipital part of head sustaining a 1 cm superficial laceration to that area. He was seen in the emergency department and had one staple placed. He's here today to have staple removed and discuss issues regarding fall. We discussed wearing a lifeline device. Wife says she's not away from home for very long at the time. Says his legs have become weaker with time. He does go to the [facility] regularly walking several laps across the pool for an hour and 20 minutes. He doesn't swim anymore. Doesn't feel that his balance is off when he is in the pool. He has not had physical therapy in some time other than what he had briefly after his pacemaker was implanted. Pacemaker was checked recently by Dr. Mayford Knife and thought to be functioning well. He has had a 2-D echocardiogram recently regarding aortic stenosis and he doesn't appear to have critical stenosis based on echo.  Review of Systems     Objective:   Physical Exam he is alert and oriented and joking as usual. Well healed 1 cm laceration right occipital area. One staple in place removed without difficulty. No bleeding ensued. Chest clear to auscultation. Cardiac exam regular rate and rhythm.        Assessment & Plan:  Head trauma with 1 cm right superficial occipital laceration   Probably would benefit from physical therapy for gait strengthening. Order written for physical therapy.

## 2012-03-07 ENCOUNTER — Other Ambulatory Visit: Payer: Self-pay

## 2012-03-07 MED ORDER — HYDROCODONE-ACETAMINOPHEN 5-500 MG PO TABS: 1.0000 | ORAL_TABLET | Freq: Two times a day (BID) | ORAL | Status: DC | PRN

## 2012-03-19 ENCOUNTER — Other Ambulatory Visit: Payer: Self-pay | Admitting: Internal Medicine

## 2012-04-05 ENCOUNTER — Ambulatory Visit (INDEPENDENT_AMBULATORY_CARE_PROVIDER_SITE_OTHER): Payer: Medicare Other | Admitting: Internal Medicine

## 2012-04-05 ENCOUNTER — Encounter: Payer: Self-pay | Admitting: Internal Medicine

## 2012-04-05 VITALS — BP 106/64 | HR 84 | Temp 96.7°F | Ht 63.0 in | Wt 126.5 lb

## 2012-04-05 DIAGNOSIS — H6120 Impacted cerumen, unspecified ear: Secondary | ICD-10-CM

## 2012-04-05 DIAGNOSIS — E785 Hyperlipidemia, unspecified: Secondary | ICD-10-CM

## 2012-04-05 DIAGNOSIS — Z8673 Personal history of transient ischemic attack (TIA), and cerebral infarction without residual deficits: Secondary | ICD-10-CM

## 2012-04-05 DIAGNOSIS — Z0000 Encounter for general adult medical examination without abnormal findings: Secondary | ICD-10-CM

## 2012-04-05 DIAGNOSIS — I10 Essential (primary) hypertension: Secondary | ICD-10-CM

## 2012-04-05 LAB — POCT URINALYSIS DIPSTICK
Ketones, UA: NEGATIVE
Leukocytes, UA: NEGATIVE
Nitrite, UA: NEGATIVE
Protein, UA: NEGATIVE
pH, UA: 6

## 2012-04-05 NOTE — Progress Notes (Signed)
Subjective:    Patient ID: John Weaver, male    DOB: [birth year], 79 y.o.   MRN: [id number]  HPI 79 year old white male native of [place]. He studied at the [employer]  and exhibited in major art shows throughout [place] and  [place]. In 1949 he was invited to [employer] in [place]. 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 This was a position he held until his retirement. He is considered part of the [employer]. He continues to paint daily and lives in [place] and has a weekend home in [place], [place]. His wife, [name], is a retired Psychologist, educational History professor and was Scientist, physiological of Art History at [employer].  In November he was hospitalized with a TIA and was placed on Plavix. He is to followup soon with Dr. Pearlean Brownie. MRI showed no stroke. He had a pacemaker placed by Dr. Graciela Husbands June 2013 and is followed by Dr. Carolanne Grumbling, cardiologist.  On October 16,2013 he went to the emergency department for a near syncopal episode. Was diagnosed with volume depletion and a UTI. Industry with normal saline and released.  History of macular degeneration of left eye. He's had some injections per Dr. Luciana Axe which is helped with vision in his left eye. Before that, he was having difficulty finding the [place] when painting. He has a history of hypothyroidism and is on Synthroid. He takes Pravachol for hyperlipidemia. He was hospitalized with war injuries Leanord Hawking were 4.  He is allergic to sulfa.   He had a 2-D echocardiogram when hospitalized in June 2013 at the time of his pacemaker insertion showing moderate aortic stenosis with severely calcified aortic leaflets. Peak gradient 27, mean gradient 12  Colonoscopy with Dr. Matthias Hughs in 2008 or 2009.  He exercises daily by stretching and twice weekly when he walks in a pool. Recently he had a fall at his home. He seemed to have lost his balance but did not have syncope. We have sent referred him to  physical therapy.        Review of Systems  Constitutional: Negative.   HENT: Negative.   Eyes:       History of macular degeneration treated by Dr. Luciana Axe  Respiratory: Negative.   Cardiovascular:       History of pacemaker inserted by Dr. Graciela Husbands and followed by Dr. Mayford Knife. Some mild leg swelling  Gastrointestinal: Negative.   Genitourinary: Negative.   Musculoskeletal:       Some right hip pain treated with hydrocodone/APAP  Neurological:       History of TIA November 2013  Hematological: Negative.   Psychiatric/Behavioral: Negative.        Objective:   Physical Exam  Vitals reviewed. Constitutional: He is oriented to person, place, and time. He appears well-developed and well-nourished. No distress.  HENT:  Head: Normocephalic and atraumatic.  Right Ear: External ear normal.  Left Ear: External ear normal.  Mouth/Throat: Oropharynx is clear and moist.       Impacted cerumen removed by curette in both ears  Eyes: Conjunctivae normal and EOM are normal. Pupils are equal, round, and reactive to light. Right eye exhibits no discharge. Left eye exhibits no discharge. No scleral icterus.  Neck: No JVD present. No tracheal deviation present. No thyromegaly present.  Cardiovascular: Normal rate and regular rhythm.   Murmur heard.      3/6 holosystolic ejection murmur. Murmur radiates into the right carotid.  Pulmonary/Chest: Effort normal and breath  sounds normal. No respiratory distress. He has no wheezes. He has no rales. He exhibits no tenderness.  Abdominal: Soft. Bowel sounds are normal. He exhibits no distension and no mass. There is no tenderness. There is no rebound and no guarding.  Genitourinary: Rectum normal and prostate normal.  Musculoskeletal: Normal range of motion.       Trace lower extremity edema  Lymphadenopathy:    He has no cervical adenopathy.  Neurological: He is alert and oriented to person, place, and time. He has normal reflexes. He displays  normal reflexes. No cranial nerve deficit. Coordination normal.  Skin: He is not diaphoretic.       Slight skin breakdown right buttock. Being treated locally  Psychiatric: He has a normal mood and affect. His behavior is normal. Judgment and thought content normal.          Assessment & Plan:  Aortic Stenosis HTN Hyperlipidemia Recent TIA  History of falls  Skin breakdown right buttock  Impacted cerumen both the ears  Hearing loss  Plan: He will return in 6 months for office visit, lipid panel liver functions and PSA. Wife will continue to take him to physical therapy for gait strengthening. I spoke with her about having a wound care nurse see him about his right buttock but she thinks it's doing okay and we'll continue to treat it topically. He is no longer driving. She feels his vision is not good. He needs bilateral hearing aids but refuses to get them.     Subjective:   Patient presents for Medicare Annual/Subsequent preventive examination.   Review Past Medical/Family/Social:See EPIC   Risk Factors  Current exercise habits: swimming going to PT Dietary issues discussed: low carb low fat  Cardiac risk factors: hyperlipidemia, HTN  Depression Screen  (Note: if answer to either of the following is "Yes", a more complete depression screening is indicated)   Over the past two weeks, have you felt down, depressed or hopeless? No  Over the past two weeks, have you felt little interest or pleasure in doing things? No Have you lost interest or pleasure in daily life? No Do you often feel hopeless? No Do you cry easily over simple problems? No   Activities of Daily Living  In your present state of health, do you have any difficulty performing the following activities?:   Driving? No longer driving due to poor vision Managing money? No  Feeding yourself? No  Getting from bed to chair? No  Climbing a flight of stairs? No  Preparing food and eating?: No  Bathing or  showering? No  Getting dressed: No  Getting to the toilet? No  Using the toilet:No  Moving around from place to place: No  In the past year have you fallen or had a near fall?:No  Are you sexually active? No  Do you have more than one partner? No   Hearing Difficulties: No  Do you often ask people to speak up or repeat themselves? yes Do you experience ringing or noises in your ears? No  Do you have difficulty understanding soft or whispered voices? yes Do you feel that you have a problem with memory? No Do you often misplace items? No    Home Safety:  Do you have a smoke alarm at your residence? Yes Do you have grab bars in the bathroom?yes Do you have throw rugs in your house?yes   Cognitive Testing  Alert? Yes Normal Appearance?Yes  Oriented to person? Yes Place? Yes  Time?  Yes  Recall of three objects? Yes  Can perform simple calculations? Yes  Displays appropriate judgment?Yes  Can read the correct time from a watch face?Yes   List the Names of Other Physician/Practitioners you currently use:  See referral list for the physicians patient is currently seeing. Dr. Brett Canales Klein;Dr. Pearlean Brownie; Dr. Fawn Kirk; Dr. Carolanne Grumbling     Review of Systems:See EPIC   Objective:     General appearance: Appears stated age and mildly obese  Head: Normocephalic, without obvious abnormality, atraumatic  Eyes: conj clear, EOMi PEERLA  Ears: normal TM's and external ear canals both ears -impacted cerumen bilaterally which was removed with curette Nose: Nares normal. Septum midline. Mucosa normal. No drainage or sinus tenderness.  Throat: lips, mucosa, and tongue normal; teeth and gums normal  Neck: no adenopathy, no carotid bruit, no JVD, supple, symmetrical, trachea midline and thyroid not enlarged, symmetric, no tenderness/mass/nodules  No CVA tenderness.  Lungs: clear to auscultation bilaterally  Breasts: normal appearance, no masses or tenderness Heart: regular rate and rhythm,  S1, S2 normal 3/6 holosystolic ejection murmur, click, rub or gallop  Abdomen: soft, non-tender; bowel sounds normal; no masses, no organomegaly  Musculoskeletal: ROM normal in all joints, no crepitus, no deformity, Normal muscle strengthen. Back  is symmetric, no curvature. Skin: Skin color, texture, turgor normal. No rashes or lesions  Lymph nodes: Cervical, supraclavicular, and axillary nodes normal.  Neurologic: CN 2 -12 Normal, Normal symmetric reflexes. Normal coordination and gait  Psych: Alert & Oriented x 3, Mood appear stable.    Assessment:    Annual wellness medicare exam   Plan:    During the course of the visit the patient was educated and counseled about appropriate screening and preventive services including:  Lab work previously done Summer and Fall 2013. Return in 6 months. Continue PT     Patient Instructions (the written plan) was given to the patient.  Medicare Attestation  I have personally reviewed:  The patient's medical and social history  Their use of alcohol, tobacco or illicit drugs  Their current medications and supplements  The patient's functional ability including ADLs,fall risks, home safety risks, cognitive, and hearing and visual impairment  Diet and physical activities  Evidence for depression or mood disorders  The patient's weight, height, BMI, and visual acuity have been recorded in the chart. I have made referrals, counseling, and provided education to the patient based on review of the above and I have provided the patient with a written personalized care plan for preventive services.

## 2012-04-06 NOTE — Patient Instructions (Addendum)
Continue same medications return in 6 months. Continue physical therapy.

## 2012-04-17 ENCOUNTER — Other Ambulatory Visit: Payer: Self-pay | Admitting: Internal Medicine

## 2012-05-07 ENCOUNTER — Other Ambulatory Visit: Payer: Self-pay | Admitting: Internal Medicine

## 2012-05-14 ENCOUNTER — Telehealth: Payer: Self-pay | Admitting: Internal Medicine

## 2012-05-14 NOTE — Telephone Encounter (Signed)
Please have Home Health evaluate buttock lesion

## 2012-05-15 ENCOUNTER — Telehealth: Payer: Self-pay

## 2012-05-15 ENCOUNTER — Other Ambulatory Visit: Payer: Self-pay

## 2012-05-15 ENCOUNTER — Telehealth: Payer: Self-pay | Admitting: Internal Medicine

## 2012-05-15 DIAGNOSIS — L989 Disorder of the skin and subcutaneous tissue, unspecified: Secondary | ICD-10-CM

## 2012-05-15 MED ORDER — HYDROCODONE-ACETAMINOPHEN 5-325 MG PO TABS: 1.0000 | ORAL_TABLET | Freq: Two times a day (BID) | ORAL | Status: DC | PRN

## 2012-05-15 NOTE — Telephone Encounter (Signed)
Order sent to [facility]

## 2012-05-15 NOTE — Telephone Encounter (Signed)
Refer to Wound Care Center

## 2012-05-15 NOTE — Telephone Encounter (Signed)
Patient is not eligible to be seen by [facility]. Referred to the Wound Care Center instead, per Dr. Lenord Fellers

## 2012-05-17 ENCOUNTER — Other Ambulatory Visit: Payer: Self-pay | Admitting: Internal Medicine

## 2012-05-21 ENCOUNTER — Telehealth: Payer: Self-pay

## 2012-05-21 NOTE — Telephone Encounter (Signed)
Patient is scheduled for an appointment with the Wound Care Center on 05/28/2012 at 9:45 am. Wife informed

## 2012-05-28 ENCOUNTER — Encounter (HOSPITAL_BASED_OUTPATIENT_CLINIC_OR_DEPARTMENT_OTHER): Payer: Medicare Other | Attending: Plastic Surgery

## 2012-05-28 DIAGNOSIS — Z96659 Presence of unspecified artificial knee joint: Secondary | ICD-10-CM | POA: Insufficient documentation

## 2012-05-28 DIAGNOSIS — Z7902 Long term (current) use of antithrombotics/antiplatelets: Secondary | ICD-10-CM | POA: Insufficient documentation

## 2012-05-28 DIAGNOSIS — E785 Hyperlipidemia, unspecified: Secondary | ICD-10-CM | POA: Insufficient documentation

## 2012-05-28 DIAGNOSIS — Z950 Presence of cardiac pacemaker: Secondary | ICD-10-CM | POA: Insufficient documentation

## 2012-05-28 DIAGNOSIS — Z8673 Personal history of transient ischemic attack (TIA), and cerebral infarction without residual deficits: Secondary | ICD-10-CM | POA: Insufficient documentation

## 2012-05-28 DIAGNOSIS — Z79899 Other long term (current) drug therapy: Secondary | ICD-10-CM | POA: Insufficient documentation

## 2012-05-28 DIAGNOSIS — L98499 Non-pressure chronic ulcer of skin of other sites with unspecified severity: Secondary | ICD-10-CM | POA: Insufficient documentation

## 2012-05-28 DIAGNOSIS — I252 Old myocardial infarction: Secondary | ICD-10-CM | POA: Insufficient documentation

## 2012-05-29 NOTE — Progress Notes (Signed)
[facility]  NAME:  John Weaver, John Weaver              ACCOUNT NO.:  [id number]  MEDICAL RECORD NO.:  [id number]      DATE OF BIRTH:  Aug 20, 1915  PHYSICIAN:  Wayland Denis, DO       VISIT DATE:  05/28/2012                                  OFFICE VISIT   The patient is a 79 year old gentleman who is here for evaluation of a right hip stage I ulcer.  He states that he had shingles many years ago, which the skin never fully recovered from.  He has a regular mattress and supplements with an egg crate, but knows that he gets this from tossing and turning in the bed.  He is ambulatory.  PAST MEDICAL HISTORY:  Positive for aortic stenosis, syncope, heart murmur, heart disease, blood clot, transient ischemic attack, hyperlipidemia, hyperthyroidism, macular degeneration, myocardial infarction, chronic constipation, and shingles.  SURGICAL HISTORY:  Left knee replacement and a pacemaker.  MEDICATIONS:  Include Plavix, hydrocodone, Synthroid, lisinopril, pravastatin, sennoside, __________, terbinafine, multivitamin, vitamin C, and Tylenol.  He is allergic to sulfa.  REVIEW OF SYSTEMS:  Otherwise negative.  SOCIAL HISTORY:  Lives with his wife at home.  On exam, he is alert, oriented, cooperative, not in any acute distress. He is pleasant.  Pupils are equal.  Extraocular muscles are intact.  No cervical lymphadenopathy.  His breathing is unlabored.  His heart rate is regular.  His abdomen is soft.  He has a thin frame.  His ulcer is stage I on the right hip.  Description noted in the note.  He will need to off-load his Silvercel with a foam dressing right now and then multivitamin, vitamin C, zinc lotion afterwards to help decrease the friction and protein supplements, and we will recheck a prealbumin.     Wayland Denis, DO     CS/MEDQ  D:  05/28/2012  T:  05/28/2012  Job:  [id number]

## 2012-05-31 ENCOUNTER — Telehealth: Payer: Self-pay | Admitting: Internal Medicine

## 2012-05-31 NOTE — Telephone Encounter (Signed)
Patient went to the wound care center regarding an ulcer on his buttock that will not heal. We could not get home health services for him. Physician at [facility] care Center was concerned about his nutritional status and ordered pre-albumin. Pre-albumin is within normal limits.

## 2012-06-04 ENCOUNTER — Encounter (HOSPITAL_BASED_OUTPATIENT_CLINIC_OR_DEPARTMENT_OTHER): Payer: Medicare Other | Attending: Plastic Surgery

## 2012-06-04 DIAGNOSIS — L98499 Non-pressure chronic ulcer of skin of other sites with unspecified severity: Secondary | ICD-10-CM | POA: Insufficient documentation

## 2012-06-04 NOTE — Progress Notes (Signed)
[facility]  NAME:  John Weaver, John Weaver              ACCOUNT NO.:  [id number]  MEDICAL RECORD NO.:  [id number]      DATE OF BIRTH:  [birth year]  PHYSICIAN:  Wayland Denis, DO       VISIT DATE:  06/04/2012                                  OFFICE VISIT   The patient is a 79 year old male who is here for followup on his right ischial ulcer.  The area is similar to last week, it is still superficial.  Fortunately, his pre-albumin came back higher than 26, so that is encouraging.  The medications are unchanged.  SOCIAL HISTORY:  Unchanged.  PHYSICAL EXAMINATION:  GENERAL:  He is alert, oriented, cooperative, not in any acute distress.  He is very pleasant. HEENT:  Pupils are equal.  No cervical lymphadenopathy. CHEST:  His breathing is unlabored and his pulses are strong and regular.  Continue with offloading, increasing protein, and multivitamin, vitamin C.  We will continue with the padding and see if we can dry it out, and then we will have him follow up in a week.     Wayland Denis, DO     CS/MEDQ  D:  06/04/2012  T:  06/04/2012  Job:  [id number]

## 2012-06-12 NOTE — Progress Notes (Signed)
[facility]  NAME:  John Weaver, John Weaver              ACCOUNT NO.:  [id number]  MEDICAL RECORD NO.:  [id number]      DATE OF BIRTH:  01/04/1916  PHYSICIAN:  Wayland Denis, DO       VISIT DATE:  06/11/2012                                  OFFICE VISIT   HISTORY:  The patient is a 79 year old gentleman who is here for followup on his right trochanteric ulcer.  He has been using Silvercel and foam dressing.  It looks much better today than it did last week. There is improvement in the overall redness and decrease in the skin breakdown.  There has been no change in his medications or social history.  PHYSICAL EXAMINATION:  GENERAL:  On exam, he is alert, oriented, cooperative, not in any acute distress.  He is pleasant. HEENT:  Pupils are equal.  Extraocular muscles are intact. LUNGS:  His breathing is unlabored. HEART:  Rate is regular.  IMPRESSION AND PLAN:  The wound is described above.  We will continue with silver alginate and foam, and encouraged him to stay off of it, rotate and continue protein intake.     Wayland Denis, DO     CS/MEDQ  D:  06/11/2012  T:  06/12/2012  Job:  [id number]

## 2012-06-18 ENCOUNTER — Other Ambulatory Visit: Payer: Self-pay

## 2012-06-18 MED ORDER — CLOPIDOGREL BISULFATE 75 MG PO TABS: 75.0000 mg | ORAL_TABLET | Freq: Every day | ORAL | Status: DC

## 2012-06-26 ENCOUNTER — Encounter: Payer: Self-pay | Admitting: Neurology

## 2012-06-26 ENCOUNTER — Ambulatory Visit (INDEPENDENT_AMBULATORY_CARE_PROVIDER_SITE_OTHER): Payer: Medicare Other | Admitting: Neurology

## 2012-06-26 VITALS — BP 157/75 | HR 80 | Temp 98.7°F | Ht 63.5 in | Wt 130.0 lb

## 2012-06-26 DIAGNOSIS — G459 Transient cerebral ischemic attack, unspecified: Secondary | ICD-10-CM

## 2012-06-26 NOTE — Patient Instructions (Signed)
He is doing well. Continue clopidogrel for secondary stroke prevention with strict control of hypertension with blood pressure goal below 130/90 and lipids with LDL cholesterol goal below 100 mg percent. I encouraged him to increase his physical activity gradually and maintain adequate hydration. Return for followup in 6 months or call earlier if necessary.

## 2012-06-26 NOTE — Progress Notes (Signed)
HPI: [name] is a 68 year Caucasian male who is seen for followup after hospital consultation for TIA on 02/16/12. He presented with sudden onset of left hand weakness and numbness while sitting at his desk. He was also found to be slightly confused. His symptoms improved quickly over the next hour and a half and recovered completely by the time he reached the hospital. CT head was unremarkable. MRI could not be done because he has a pacemaker. Carotid Doppler showed no significant extracranial stenosis. Transthoracic echo showed ejection fraction of 55-60% with normal wall motion. Mild to moderate aortic stenosis was noted. Vascular risk factors identified included hypertension and hyperlipidemia. Lipid profile was normal. Hb A1c was normal. He was changed from aspirin to Plavix for secondary stroke prevention and states he has done well. He is back to all his activities of daily living and has no physical deficits from his stroke. I He is tolerating clopidogrel without significant bleeding but has had bruising on his skin. Is tolerating protocol as well without significant muscle aches or pains. He has given up his driving but does not seem to be too disappointed.He is an Consulting civil engineer. He does not get much physical activity and does not go out. He has seen his primary physician in January this year and had lab work done which was normal. ROS: 14 system review of systems is positive for swelling in the legs, snoring, feeling cold. Physical Exam General: well developed, well nourished frail caucasian male seated, in no evident distress Head: head normocephalic and atraumatic. Orohparynx benign Neck: supple with no carotid or supraclavicular bruits Cardiovascular: regular rate and rhythm, no murmurs Musculoskeletal ; mild kyphosis Skin; pacemaker infraclavicular  Neurologic Exam Mental Status: Awake and fully alert. Oriented to place and time. Recent and remote memory mildly diminishedt.  Attention span, concentration and fund of knowledge appropriate. Mood and affect appropriate.  Cranial Nerves: Fundoscopic exam reveals sharp disc margins. Pupils unequal left larger than right, briskly reactive to light. Extraocular movements full without nystagmus. Visual fields full to confrontation. Hearing intact  . Facial sensation intact. Face, tongue, palate move normally and symmetrically. Neck flexion and extension normal.  Motor: Normal bulk and tone. Normal strength in all tested extremity muscles. Sensory.: intact to tough and pinprick and vibratory.  Coordination: Rapid alternating movements normal in all extremities. Finger-to-nose and heel-to-shin performed accurately bilaterally. Gait and Station: Arises from chair without difficulty. Stance shows mild kyphosisl. Gait demonstrates normal stride length and balance . Able to heel, toe and tandem walk without difficulty.  Reflexes: 1+ and symmetric. Toes downgoing.     ASSESSMENT:: 89 year With right hemispheric TIA in November 2013 with vascular risk factors of age, hypertension and hyperlipidemia.  PLAN: I had a long discussion with the patient and wife regarding stroke risk, risk factor modification and answered questions.Continue clopidogrel for secondary stroke prevention with strict control of hypertension with blood pressure goal below 130/90 and lipids with LDL cholesterol goal below 100 mg percent. I have encouraged him to increase his physical activity and maintain adequate hydration. Return for followup in 6 months or call earlier if necessary.

## 2012-07-16 ENCOUNTER — Telehealth: Payer: Self-pay | Admitting: Internal Medicine

## 2012-07-16 NOTE — Telephone Encounter (Signed)
Please take care of these new prescriptions

## 2012-07-17 ENCOUNTER — Other Ambulatory Visit: Payer: Self-pay

## 2012-07-17 MED ORDER — LISINOPRIL 10 MG PO TABS: 10.0000 mg | ORAL_TABLET | Freq: Every day | ORAL | Status: DC

## 2012-07-17 MED ORDER — LEVOTHYROXINE SODIUM 100 MCG PO TABS: 100.0000 ug | ORAL_TABLET | Freq: Every day | ORAL | Status: DC

## 2012-07-17 MED ORDER — CLOPIDOGREL BISULFATE 75 MG PO TABS: 75.0000 mg | ORAL_TABLET | Freq: Every day | ORAL | Status: DC

## 2012-07-17 MED ORDER — PRAVASTATIN SODIUM 40 MG PO TABS: 40.0000 mg | ORAL_TABLET | Freq: Every day | ORAL | Status: DC

## 2012-08-09 ENCOUNTER — Other Ambulatory Visit: Payer: Self-pay | Admitting: Internal Medicine

## 2012-08-21 ENCOUNTER — Other Ambulatory Visit: Payer: Self-pay | Admitting: Internal Medicine

## 2012-09-04 ENCOUNTER — Ambulatory Visit (INDEPENDENT_AMBULATORY_CARE_PROVIDER_SITE_OTHER): Payer: Medicare Other | Admitting: Internal Medicine

## 2012-09-04 ENCOUNTER — Encounter: Payer: Self-pay | Admitting: Internal Medicine

## 2012-09-04 ENCOUNTER — Telehealth: Payer: Self-pay | Admitting: Internal Medicine

## 2012-09-04 VITALS — BP 120/64 | HR 88 | Temp 99.0°F | Wt 126.0 lb

## 2012-09-04 DIAGNOSIS — R32 Unspecified urinary incontinence: Secondary | ICD-10-CM

## 2012-09-04 DIAGNOSIS — R197 Diarrhea, unspecified: Secondary | ICD-10-CM

## 2012-09-04 LAB — CBC WITH DIFFERENTIAL/PLATELET
Basophils Absolute: 0 10*3/uL (ref 0.0–0.1)
Basophils Relative: 0 % (ref 0–1)
HCT: 35.8 % — ABNORMAL LOW (ref 39.0–52.0)
Lymphocytes Relative: 17 % (ref 12–46)
MCHC: 34.1 g/dL (ref 30.0–36.0)
Monocytes Absolute: 1.8 10*3/uL — ABNORMAL HIGH (ref 0.1–1.0)
Neutro Abs: 6.4 10*3/uL (ref 1.7–7.7)
Neutrophils Relative %: 64 % (ref 43–77)
Platelets: 262 10*3/uL (ref 150–400)
RDW: 13.4 % (ref 11.5–15.5)
WBC: 10.1 10*3/uL (ref 4.0–10.5)

## 2012-09-04 LAB — POCT URINALYSIS DIPSTICK
Blood, UA: NEGATIVE
Protein, UA: NEGATIVE
Spec Grav, UA: 1.015
Urobilinogen, UA: NEGATIVE
pH, UA: 5.5

## 2012-09-04 NOTE — Telephone Encounter (Signed)
See at 3 pm

## 2012-09-05 LAB — COMPREHENSIVE METABOLIC PANEL
ALT: 17 U/L (ref 0–53)
AST: 26 U/L (ref 0–37)
Albumin: 4 g/dL (ref 3.5–5.2)
Alkaline Phosphatase: 123 U/L — ABNORMAL HIGH (ref 39–117)
BUN: 30 mg/dL — ABNORMAL HIGH (ref 6–23)
Calcium: 9.9 mg/dL (ref 8.4–10.5)
Chloride: 105 mEq/L (ref 96–112)
Potassium: 4.3 mEq/L (ref 3.5–5.3)
Sodium: 138 mEq/L (ref 135–145)

## 2012-09-08 ENCOUNTER — Other Ambulatory Visit: Payer: Self-pay | Admitting: Internal Medicine

## 2012-09-30 ENCOUNTER — Encounter: Payer: Self-pay | Admitting: Internal Medicine

## 2012-09-30 NOTE — Progress Notes (Signed)
  Subjective:    Patient ID: John Weaver, male    DOB: Jun 03, 1915, 79 y.o.   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 It's not clear to me exactly how this started the he recalls having diarrhea onset after having some just over for dinner. However he does take laxatives for constipation. Does have some issues with urinary incontinence. Has had some issues with skin breakdown on his buttocks but that seems to have resolved.    Review of Systems     Objective:   Physical Exam Chest is clear to auscultation. Cardiac exam: Regular rate and rhythm. Murmur unchanged. Extremities without edema. Abdomen: No hepatosplenomegaly masses or tenderness. Rectal exam reveals no impaction. Stool is guaiac negative. Skin warm and dry. No evidence of skin breakdown.        Assessment & Plan:  Diarrhea-etiology unclear. Maybe he overdid the laxatives causing diarrhea. He does not have fecal impaction. Recommend clear liquids and advance diet slowly. CBC and see matter stable. Urinalysis is normal.  History of urinary incontinence  Plan: Return when necessary.

## 2012-10-02 ENCOUNTER — Other Ambulatory Visit: Payer: Medicare Other | Admitting: Internal Medicine

## 2012-10-02 DIAGNOSIS — E785 Hyperlipidemia, unspecified: Secondary | ICD-10-CM

## 2012-10-02 DIAGNOSIS — Z79899 Other long term (current) drug therapy: Secondary | ICD-10-CM

## 2012-10-02 LAB — LIPID PANEL
LDL Cholesterol: 54 mg/dL (ref 0–99)
VLDL: 10 mg/dL (ref 0–40)

## 2012-10-02 LAB — HEPATIC FUNCTION PANEL
Bilirubin, Direct: 0.2 mg/dL (ref 0.0–0.3)
Indirect Bilirubin: 0.5 mg/dL (ref 0.0–0.9)
Total Protein: 6.8 g/dL (ref 6.0–8.3)

## 2012-10-04 ENCOUNTER — Ambulatory Visit (INDEPENDENT_AMBULATORY_CARE_PROVIDER_SITE_OTHER): Payer: Medicare Other | Admitting: Internal Medicine

## 2012-10-04 VITALS — BP 140/92 | HR 92 | Temp 98.2°F | Wt 125.0 lb

## 2012-10-04 DIAGNOSIS — R079 Chest pain, unspecified: Secondary | ICD-10-CM

## 2012-10-04 NOTE — Progress Notes (Signed)
  Subjective:    Patient ID: Courtland Reas, male    DOB: [birth year], 79 y.o.   MRN: [id number]  HPI 79 year old male in today for six-month recheck. Recently had a bout of prolonged chest pain at rest. 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 He had no nausea or diaphoresis. Chest pain was left-sided. No radiation of the pain. No shortness of breath. Has history of recent TIA and is on Plavix. Patient says some friends came over and he drank champagne with them and chest pain resolved. He has a permanent pacemaker.    Review of Systems     Objective:   Physical Exam Neck is supple without JVD thyromegaly or carotid bruits. Chest clear to auscultation. Cardiac exam regular rate and rhythm murmur is unchanged. He has left anterior chest wall tenderness with palpation. Extremities without edema.  EKG is unchanged from previous EKGs.      Assessment & Plan:  Chest pain-likely musculoskeletal. It has resolved. He is tender in left anterior chest wall to palpation.  History of TIA-treated with Plavix  Hypertension-stable  Hyperlipidemia stable on statin medication  History of permanent pacemaker  Plan: Lipid panel and liver functions are within normal limits. If chest pain recurs, he is to go immediately to the emergency department.  Spent 25 minutes evaluating patient

## 2012-10-15 ENCOUNTER — Other Ambulatory Visit: Payer: Self-pay | Admitting: Internal Medicine

## 2012-10-15 NOTE — Telephone Encounter (Signed)
Refill x 6 months 

## 2012-11-26 ENCOUNTER — Encounter: Payer: Self-pay | Admitting: Internal Medicine

## 2012-11-26 ENCOUNTER — Ambulatory Visit (INDEPENDENT_AMBULATORY_CARE_PROVIDER_SITE_OTHER): Payer: Medicare Other | Admitting: Internal Medicine

## 2012-11-26 VITALS — BP 126/80 | HR 88 | Temp 97.9°F | Wt 130.0 lb

## 2012-11-26 DIAGNOSIS — Z8673 Personal history of transient ischemic attack (TIA), and cerebral infarction without residual deficits: Secondary | ICD-10-CM

## 2012-11-26 DIAGNOSIS — Z7901 Long term (current) use of anticoagulants: Secondary | ICD-10-CM

## 2012-11-26 DIAGNOSIS — IMO0002 Reserved for concepts with insufficient information to code with codable children: Secondary | ICD-10-CM

## 2012-11-26 DIAGNOSIS — S30813A Abrasion of scrotum and testes, initial encounter: Secondary | ICD-10-CM

## 2012-12-01 ENCOUNTER — Encounter: Payer: Self-pay | Admitting: Internal Medicine

## 2012-12-01 NOTE — Progress Notes (Signed)
  Subjective:    Patient ID: John Weaver, male    DOB: 10/07/1915, 79 y.o.   MRN: [id number]  HPI 79 year old white male very independent. Is able to bathe without any help. Was using what sounds like a plastic soap dish containing soap to bathe.  Thinks he suffered an abrasion of his scrotum using this. Noticed some bleeding on his towel and several days later noticed significant bleeding after a shower. Bleeding stopped. No pain in testicles or scrotum.    Review of Systems     Objective:   Physical Exam small abraded area left testicle. No evidence of hematoma. Testicles are normal.        Assessment & Plan:  Abrasion of scrotum-bleeding exacerbated by Plavix  Chronic anticoagulation with history of TIA  No active bleeding at present time. Patient reassured. Return as needed.

## 2012-12-01 NOTE — Patient Instructions (Addendum)
You are at risk for bleeding due to anticoagulation. Be careful with bathing.

## 2012-12-02 ENCOUNTER — Encounter: Payer: Self-pay | Admitting: Internal Medicine

## 2012-12-02 NOTE — Patient Instructions (Addendum)
If chest pain recurs, go immediately to emergency department. Continue same medications. Due for physical examination in 6 months. Lipid panel and liver functions are normal.

## 2012-12-27 ENCOUNTER — Ambulatory Visit (INDEPENDENT_AMBULATORY_CARE_PROVIDER_SITE_OTHER): Payer: Medicare Other | Admitting: Neurology

## 2012-12-27 ENCOUNTER — Ambulatory Visit (INDEPENDENT_AMBULATORY_CARE_PROVIDER_SITE_OTHER): Payer: Medicare Other

## 2012-12-27 ENCOUNTER — Encounter: Payer: Self-pay | Admitting: Neurology

## 2012-12-27 VITALS — BP 124/66 | HR 60 | Wt 128.0 lb

## 2012-12-27 DIAGNOSIS — R0989 Other specified symptoms and signs involving the circulatory and respiratory systems: Secondary | ICD-10-CM

## 2012-12-27 DIAGNOSIS — G459 Transient cerebral ischemic attack, unspecified: Secondary | ICD-10-CM

## 2012-12-27 NOTE — Progress Notes (Signed)
HPI: [name] is a 65 year Caucasian male who is seen for followup after hospital consultation for TIA on 02/16/12. He presented with sudden onset of left hand weakness and numbness while sitting at his desk. He was also found to be slightly confused. His symptoms improved quickly over the next hour and a half and recovered completely by the time he reached the hospital. CT head was unremarkable. MRI could not be done because he has a pacemaker. Carotid Doppler showed no significant extracranial stenosis. Transthoracic echo showed ejection fraction of 55-60% with normal wall motion. Mild to moderate aortic stenosis was noted. Vascular risk factors identified included hypertension and hyperlipidemia. Lipid profile was normal. Hb A1c was normal. He was changed from aspirin to Plavix for secondary stroke prevention and states he has done well. He is back to all his activities of daily living and has no physical deficits from his stroke. I He is tolerating clopidogrel without significant bleeding but has had bruising on his skin. Is tolerating protocol as well without significant muscle aches or pains. He has given up his driving but does not seem to be too disappointed.He is an Consulting civil engineer. He does not get much physical activity and does not go out. He has seen his primary physician in January this year and had lab work done which was normal. Update 12/27/2012 He returns for f/u after last visit 06/26/2012.He is doing well without any stroke or TIA symptoms.He had leg pain for which he saw his primary MD who felt this may be related to his degenerative spine disease. He does not want surgery.He is tolerating plavix without bleeding and pravachol without myalgias. He has no complaints today. ROS: 14 system review of systems is positive for no complaints Physical Exam General: well developed, well nourished frail [place] n male seated, in no evident distress Head: head normocephalic and  atraumatic. Orohparynx benign Neck: supple with bilateral carotid   bruits Cardiovascular: regular rate and rhythm, soft ejection systolic murmur Musculoskeletal ; mild kyphosis Skin; pacemaker infraclavicular Filed Vitals:   12/27/12 1445  BP: 124/66  Pulse: 60    Neurologic Exam Mental Status: Awake and fully alert. Oriented to place and time. Recent and remote memory mildly diminishedt. Attention span, concentration and fund of knowledge appropriate. Mood and affect appropriate.  Cranial Nerves: Fundoscopic exam reveals sharp disc margins. Pupils unequal left larger than right, briskly reactive to light. Extraocular movements full without nystagmus. Visual fields full to confrontation. Hearing intact  . Facial sensation intact. Face, tongue, palate move normally and symmetrically. Neck flexion and extension normal.  Motor: Normal bulk and tone. Normal strength in all tested extremity muscles. Sensory.: intact to tough and pinprick and vibratory.  Coordination: Rapid alternating movements normal in all extremities. Finger-to-nose and heel-to-shin performed accurately bilaterally. Gait and Station: Arises from chair without difficulty. Stance shows mild kyphosisl. Gait demonstrates normal stride length and balance . Able to heel, toe and tandem walk without difficulty.  Reflexes: 1+ and symmetric. Toes downgoing.     ASSESSMENT:: 89 year With right hemispheric TIA in November 2013 with vascular risk factors of age, hypertension and hyperlipidemia.  PLAN: I had a long discussion with the patient and wife regarding stroke risk, risk factor modification and answered questions.Continue clopidogrel for secondary stroke prevention with strict control of hypertension with blood pressure goal below 130/90 and lipids with LDL cholesterol goal below 100 mg percent. I have encouraged him to increase his physical activity and maintain adequate hydration.  I have also advised the patient to discuss with  Dr. Charlett Weaver whether he would benefit from kyphoplasty for his chronic pain. Return for followup in 6 months or call earlier if necessary.

## 2012-12-27 NOTE — Patient Instructions (Addendum)
Continue clopidogrel for secondary stroke prevention with strict control of hypertension with blood pressure goal below 130/90 and lipids with LDL cholesterol goal below 100 mg percent. Check carotid ultrasound followup today. Return for followup in 6 months or call earlier if necessary. I have also advised the patient to discuss with Dr. Cherylann Parr whether he would benefit from kyphoplasty for his chronic pain.

## 2013-01-16 ENCOUNTER — Ambulatory Visit (INDEPENDENT_AMBULATORY_CARE_PROVIDER_SITE_OTHER): Payer: Medicare Other | Admitting: Internal Medicine

## 2013-01-16 DIAGNOSIS — Z23 Encounter for immunization: Secondary | ICD-10-CM

## 2013-01-17 ENCOUNTER — Other Ambulatory Visit: Payer: Self-pay | Admitting: Internal Medicine

## 2013-03-06 ENCOUNTER — Ambulatory Visit (INDEPENDENT_AMBULATORY_CARE_PROVIDER_SITE_OTHER): Payer: Medicare Other | Admitting: *Deleted

## 2013-03-06 DIAGNOSIS — I442 Atrioventricular block, complete: Secondary | ICD-10-CM

## 2013-03-06 LAB — MDC_IDC_ENUM_SESS_TYPE_INCLINIC
Brady Statistic RA Percent Paced: 4.1 %
Brady Statistic RV Percent Paced: 9.6 %
Date Time Interrogation Session: 20141203120249
Implantable Pulse Generator Serial Number: 7350973
Lead Channel Impedance Value: 400 Ohm
Lead Channel Impedance Value: 475 Ohm
Lead Channel Pacing Threshold Pulse Width: 0.4 ms
Lead Channel Pacing Threshold Pulse Width: 0.4 ms
Lead Channel Setting Sensing Sensitivity: 2 mV

## 2013-03-06 NOTE — Addendum Note (Signed)
Addended by: Donalynn Furlong on: 03/06/2013 01:11 PM   Modules accepted: Orders

## 2013-03-06 NOTE — Progress Notes (Signed)
Pacemaker check in clinic. Normal device function. Thresholds, sensing, impedances consistent with previous measurements. Device programmed to maximize longevity. 4367 mode switches. No high ventricular rates noted. Device programmed at appropriate safety margins. Histogram distribution appropriate for patient activity level. Device programmed to optimize intrinsic conduction. Estimated longevity 8.2 to 9.1 years. Patient enrolled in remote follow-up. ROV 06-11-13 @ 1015 with GT.

## 2013-04-01 ENCOUNTER — Encounter: Payer: Self-pay | Admitting: Cardiology

## 2013-04-02 ENCOUNTER — Encounter: Payer: Self-pay | Admitting: General Surgery

## 2013-04-02 DIAGNOSIS — R001 Bradycardia, unspecified: Secondary | ICD-10-CM

## 2013-04-02 DIAGNOSIS — R55 Syncope and collapse: Secondary | ICD-10-CM | POA: Insufficient documentation

## 2013-04-12 ENCOUNTER — Telehealth: Payer: Self-pay | Admitting: Neurology

## 2013-04-12 ENCOUNTER — Encounter: Payer: Self-pay | Admitting: Neurology

## 2013-04-12 NOTE — Telephone Encounter (Signed)
Called patient to reschedule 06/12/13 appointment per Dr. Pearlean BrownieSethi not in office, rescheduled to 06/19/13, left message on voicemail, sent letter.

## 2013-04-16 ENCOUNTER — Encounter: Payer: Self-pay | Admitting: Internal Medicine

## 2013-04-16 ENCOUNTER — Ambulatory Visit (INDEPENDENT_AMBULATORY_CARE_PROVIDER_SITE_OTHER): Payer: Medicare Other | Admitting: Internal Medicine

## 2013-04-16 ENCOUNTER — Ambulatory Visit
Admission: RE | Admit: 2013-04-16 | Discharge: 2013-04-16 | Disposition: A | Discharge status: Home / Self Care | Payer: Medicare Other | Source: Ambulatory Visit | Attending: Internal Medicine | Admitting: Internal Medicine

## 2013-04-16 VITALS — BP 146/74 | HR 76 | Temp 97.0°F | Wt 129.5 lb

## 2013-04-16 DIAGNOSIS — R062 Wheezing: Secondary | ICD-10-CM

## 2013-04-16 DIAGNOSIS — R059 Cough, unspecified: Secondary | ICD-10-CM

## 2013-04-16 DIAGNOSIS — R9389 Abnormal findings on diagnostic imaging of other specified body structures: Secondary | ICD-10-CM

## 2013-04-16 DIAGNOSIS — R05 Cough: Secondary | ICD-10-CM

## 2013-04-16 DIAGNOSIS — J209 Acute bronchitis, unspecified: Secondary | ICD-10-CM

## 2013-04-16 DIAGNOSIS — R918 Other nonspecific abnormal finding of lung field: Secondary | ICD-10-CM

## 2013-04-16 MED ORDER — BENZONATATE 100 MG PO CAPS: 100.0000 mg | ORAL_CAPSULE | Freq: Three times a day (TID) | ORAL | Status: DC

## 2013-04-16 MED ORDER — BENZONATATE 100 MG PO CAPS: 100.0000 mg | ORAL_CAPSULE | Freq: Three times a day (TID) | ORAL | Status: DC | PRN

## 2013-04-16 MED ORDER — AZITHROMYCIN 250 MG PO TABS: ORAL_TABLET | ORAL | Status: DC

## 2013-04-16 MED ORDER — ALBUTEROL SULFATE HFA 108 (90 BASE) MCG/ACT IN AERS: 2.0000 | INHALATION_SPRAY | Freq: Four times a day (QID) | RESPIRATORY_TRACT | Status: DC | PRN

## 2013-04-16 MED ORDER — CEFTRIAXONE SODIUM 1 G IJ SOLR
1.0000 g | Freq: Once | INTRAMUSCULAR | Status: AC
  Administered 2013-04-16: 1 g via INTRAMUSCULAR

## 2013-04-16 NOTE — Progress Notes (Signed)
   Subjective:    Patient ID: John Weaver, male    DOB: Aug 24, 1915, 80 y.o.   MRN: [id number]  HPI URI symptoms for one week. Says he has had cough with some " slimy " sputum production. No documented fever or shaking chills. No myalgias. No nausea and vomiting. No shortness of breath. Wife says he's not had as much energy lately. Wife has noticed congested cough.    Review of Systems     Objective:   Physical Exam pulse ox is 97% on room air. He has congested cough. TMs are chronically scarred. Pharynx slightly injected. Neck is supple without significant adenopathy. Chest scattered rhonchi and wheezing. Skin is warm and dry. He is alert. Not as perky as usual.       Assessment & Plan:  Acute bronchitis  Possible lung nodule  History of pacemaker  Plan: Chest x-ray to rule out pneumonia. Rocephin 1 g IM given today. Zithromax Z-Pak take as directed with one refill. Tessalon Perles 100 mg 3 times daily as needed for cough. Albuterol inhaler 2 sprays by mouth 4 times a day for cough and wheezing.  Addendum: Chest x-ray shows no evidence of pneumonia but raises question of lung nodule. CT is recommended. Proceed with that.  25 minutes spent with patient

## 2013-04-16 NOTE — Addendum Note (Signed)
Addended by: Judy PimpleEILAND, Janissa Bertram M on: 04/16/2013 03:21 PM   Modules accepted: Orders

## 2013-04-16 NOTE — Addendum Note (Signed)
Addended by: Judy PimpleEILAND, Teon Hudnall M on: 04/16/2013 05:24 PM   Modules accepted: Orders

## 2013-04-16 NOTE — Progress Notes (Signed)
Patient informed. 

## 2013-04-16 NOTE — Patient Instructions (Addendum)
Please have chest x-ray now. Rocephin 1 g IM given in office. Take Zithromax Z-PAK as corrected. Tessalon Perles 100 mg 3 times daily as needed for cough. Use inhaler 4 times a day.

## 2013-04-18 ENCOUNTER — Ambulatory Visit: Payer: Medicare Other | Admitting: Cardiology

## 2013-04-24 ENCOUNTER — Encounter: Payer: Self-pay | Admitting: Cardiology

## 2013-04-24 ENCOUNTER — Encounter: Payer: Self-pay | Admitting: General Surgery

## 2013-04-24 ENCOUNTER — Ambulatory Visit (INDEPENDENT_AMBULATORY_CARE_PROVIDER_SITE_OTHER): Payer: Medicare Other | Admitting: Cardiology

## 2013-04-24 VITALS — BP 158/76 | HR 70 | Ht 63.0 in | Wt 127.8 lb

## 2013-04-24 DIAGNOSIS — I4510 Unspecified right bundle-branch block: Secondary | ICD-10-CM

## 2013-04-24 DIAGNOSIS — I359 Nonrheumatic aortic valve disorder, unspecified: Secondary | ICD-10-CM

## 2013-04-24 DIAGNOSIS — Z950 Presence of cardiac pacemaker: Secondary | ICD-10-CM

## 2013-04-24 DIAGNOSIS — I350 Nonrheumatic aortic (valve) stenosis: Secondary | ICD-10-CM

## 2013-04-24 DIAGNOSIS — I498 Other specified cardiac arrhythmias: Secondary | ICD-10-CM

## 2013-04-24 DIAGNOSIS — R001 Bradycardia, unspecified: Secondary | ICD-10-CM

## 2013-04-24 DIAGNOSIS — R55 Syncope and collapse: Secondary | ICD-10-CM

## 2013-04-24 DIAGNOSIS — I495 Sick sinus syndrome: Secondary | ICD-10-CM

## 2013-04-24 DIAGNOSIS — I10 Essential (primary) hypertension: Secondary | ICD-10-CM

## 2013-04-24 DIAGNOSIS — R0989 Other specified symptoms and signs involving the circulatory and respiratory systems: Secondary | ICD-10-CM | POA: Insufficient documentation

## 2013-04-24 NOTE — Progress Notes (Signed)
[address] [place], [place]  [address] Phone: [phone] Fax:  [phone]  Date:  04/24/2013   ID:  John Weaver, DOB [birth year], MRN [id number]  PCP:  Margaree Mackintosh, MD  Cardiologist:  Armanda Magic, MD     History of Present Illness: John Weaver is a 80 y.o. male with a history of syncope in the setting of SSS and is s/p PPM.  He also has a history of HTN, moderate AS, mild AI and mild pulmonary HTN with PASP .  He is doing well today.  He denies any chest pain, SOB, DOE, LE edema, dizziness, palpitations or syncope.   Wt Readings from Last 3 Encounters:  04/24/13 127 lb 12.8 oz (57.97 kg)  04/16/13 129 lb 8 oz (58.741 kg)  04/02/13 126 lb 3.2 oz (57.244 kg)     Past Medical History  Diagnosis Date  . Back pain   . Hypertension   . Hypercholesteremia   . Hypothyroidism   . Stroke 02/2012    tia  . History of CHF (congestive heart failure)   . Hemorrhoids   . DDD (degenerative disc disease)     of the spine  . Kidney stones   . Nephritis     at age 70  . Bradycardia     with snycope  . SSS (sick sinus syndrome)     in setting of syncope s/p PPM    Current Outpatient Prescriptions  Medication Sig Dispense Refill  . albuterol (PROVENTIL HFA;VENTOLIN HFA) 108 (90 BASE) MCG/ACT inhaler Inhale 2 puffs into the lungs every 6 (six) hours as needed for wheezing or shortness of breath.  1 Inhaler  2  . Ascorbic Acid (VITAMIN C) 1000 MG tablet Take 1,000 mg by mouth daily.      Marland Kitchen azithromycin (ZITHROMAX Z-PAK) 250 MG tablet Take 2 tablets (500 mg) on  Day 1,  followed by 1 tablet (250 mg) once daily on Days 2 through 5.  6 each  1  . benzonatate (TESSALON) 100 MG capsule Take 1 capsule (100 mg total) by mouth 3 (three) times daily as needed for cough.  30 capsule  1  . clopidogrel (PLAVIX) 75 MG tablet TAKE ONE (1) TABLET BY MOUTH EVERY DAY  30 tablet  11  . Diphenhydramine-APAP, sleep, (TYLENOL PM EXTRA STRENGTH PO) Take 1-2 tablets by mouth at  bedtime as needed. For sleep      . HYDROcodone-acetaminophen (NORCO/VICODIN) 5-325 MG per tablet TAKE ONE (1) TABLET BY MOUTH TWO (2) TIMES DAILY AS NEEDED  60 tablet  5  . levothyroxine (SYNTHROID, LEVOTHROID) 100 MCG tablet Take 1 tablet (100 mcg total) by mouth daily.  90 tablet  3  . lisinopril (PRINIVIL,ZESTRIL) 10 MG tablet Take 20 mg by mouth daily.      . Multiple Vitamins-Minerals (MULTIVITAMINS THER. W/MINERALS) TABS Take 1 tablet by mouth daily.      . pravastatin (PRAVACHOL) 40 MG tablet Take 1 tablet (40 mg total) by mouth at bedtime.  90 tablet  3  . Pseudoeph-Doxylamine-DM-APAP (NYQUIL D COLD/FLU PO) Take by mouth.      . Sennosides (EX-LAX) 15 MG TABS Take 15 mg by mouth at bedtime as needed. For constipation       No current facility-administered medications for this visit.    Allergies:    Allergies  Allergen Reactions  . Sulfa Antibiotics Rash    Social History:  The patient  reports that he has quit smoking. His smoking use  included Pipe. He has never used smokeless tobacco. He reports that he drinks about 0.6 ounces of alcohol per week. He reports that he does not use illicit drugs.   Family History:  The patient's family history includes Kidney disease in his father and mother.   ROS:  Please see the history of present illness.      All other systems reviewed and negative.   PHYSICAL EXAM: VS:  BP 158/76  Pulse 70  Ht 5\' 3"  (1.6 m)  Wt 127 lb 12.8 oz (57.97 kg)  BMI 22.64 kg/m2 Well nourished, well developed, in no acute distress HEENT: normal Neck: no JVDright carotid artery bruit Cardiac:  normal S1, S2; RRR; 2/6 SM at RUSB to LLSB Lungs:  clear to auscultation bilaterally, no wheezing, rhonchi or rales Abd: soft, nontender, no hepatomegaly Ext: no edema Skin: warm and dry Neuro:  CNs 2-12 intact, no focal abnormalities noted  ASSESSMENT/PLAN: 1.  Syncope in setting of SSS with no reoccurence after PPM 2.  SSS s/p PPM - pacer followed in our pacer  clininc 3.  HTN with mildly elevated BP   - continue Lisinopril 4.  Moderate AS asymptomatic - recheck 2D echo 5.  Mild pulmonary HTN 6.  Right carotid artery bruit - will check carotid dopplers  Followup with me in 6 months  Signed, Armanda Magicraci Edmund Holcomb, MD 04/24/2013 4:05 PM

## 2013-04-24 NOTE — Patient Instructions (Signed)
Your physician recommends that you continue on your current medications as directed. Please refer to the Current Medication list given to you today.  Your physician has requested that you have an echocardiogram. Echocardiography is a painless test that uses sound waves to create images of your heart. It provides your doctor with information about the size and shape of your heart and how well your heart's chambers and valves are working. This procedure takes approximately one hour. There are no restrictions for this procedure.  Your physician has requested that you have a carotid duplex. This test is an ultrasound of the carotid arteries in your neck. It looks at blood flow through these arteries that supply the brain with blood. Allow one hour for this exam. There are no restrictions or special instructions.  Your physician wants you to follow-up in: 6 Months with Dr Sherlyn Lickurner You will receive a reminder letter in the mail two months in advance. If you don't receive a letter, please call our office to schedule the follow-up appointment.

## 2013-05-14 ENCOUNTER — Encounter: Payer: Self-pay | Admitting: Internal Medicine

## 2013-05-14 ENCOUNTER — Ambulatory Visit (HOSPITAL_COMMUNITY): Payer: Medicare Other

## 2013-05-14 ENCOUNTER — Ambulatory Visit (HOSPITAL_COMMUNITY): Payer: Medicare Other | Attending: Internal Medicine | Admitting: Radiology

## 2013-05-14 DIAGNOSIS — I2789 Other specified pulmonary heart diseases: Secondary | ICD-10-CM | POA: Insufficient documentation

## 2013-05-14 DIAGNOSIS — I079 Rheumatic tricuspid valve disease, unspecified: Secondary | ICD-10-CM | POA: Insufficient documentation

## 2013-05-14 DIAGNOSIS — R55 Syncope and collapse: Secondary | ICD-10-CM | POA: Insufficient documentation

## 2013-05-14 DIAGNOSIS — I350 Nonrheumatic aortic (valve) stenosis: Secondary | ICD-10-CM

## 2013-05-14 DIAGNOSIS — I495 Sick sinus syndrome: Secondary | ICD-10-CM | POA: Insufficient documentation

## 2013-05-14 DIAGNOSIS — I080 Rheumatic disorders of both mitral and aortic valves: Secondary | ICD-10-CM | POA: Insufficient documentation

## 2013-05-14 DIAGNOSIS — I359 Nonrheumatic aortic valve disorder, unspecified: Secondary | ICD-10-CM

## 2013-05-14 DIAGNOSIS — I10 Essential (primary) hypertension: Secondary | ICD-10-CM | POA: Insufficient documentation

## 2013-05-14 DIAGNOSIS — R011 Cardiac murmur, unspecified: Secondary | ICD-10-CM | POA: Insufficient documentation

## 2013-05-14 DIAGNOSIS — I4510 Unspecified right bundle-branch block: Secondary | ICD-10-CM | POA: Insufficient documentation

## 2013-05-14 NOTE — Progress Notes (Signed)
Echocardiogram performed.  

## 2013-05-17 ENCOUNTER — Telehealth: Payer: Self-pay | Admitting: Cardiology

## 2013-05-17 NOTE — Telephone Encounter (Signed)
Left message to call back  

## 2013-05-17 NOTE — Telephone Encounter (Signed)
New message    Patient wife calling stating someone called asking to speak with patient . The message was hard to hear

## 2013-05-17 NOTE — Telephone Encounter (Signed)
Wife aware of echo results.

## 2013-05-20 ENCOUNTER — Encounter (HOSPITAL_COMMUNITY): Payer: Medicare Other

## 2013-05-23 ENCOUNTER — Ambulatory Visit (HOSPITAL_COMMUNITY): Payer: Medicare Other

## 2013-06-03 ENCOUNTER — Telehealth: Payer: Self-pay | Admitting: Internal Medicine

## 2013-06-03 ENCOUNTER — Telehealth: Payer: Self-pay

## 2013-06-03 ENCOUNTER — Telehealth: Payer: Self-pay | Admitting: *Deleted

## 2013-06-03 NOTE — Telephone Encounter (Signed)
PC from Dr. Beryle QuantBaxley's office regarding patient needing to be seen tomorrow for ongoing CP/chest pressure over past 5-6 weeks. Denies SOB. Denies dizzy. Denies swelling. Patient declines to go to Emergency Department or to take next available appointment. Patient/Dr. [name] stated, "Patient needs to be seen tomorrow."  Patient worked into schedule of John FredricksonLori Gerhardt, NP, for 06/04/13 @ 11:45. Patient instructed to be here 15 minutes early to check in for appointment. Dr. Beryle QuantBaxley's office stated that patient agreed.

## 2013-06-03 NOTE — Telephone Encounter (Signed)
Phone call from patient this pm requesting an appointment for chest pain he's been having for 5-6 weeks. Per Dr. Lenord FellersBaxley, refer back to [facility]. Appointment tomorrrow  With Norma FredricksonLori Gerhardt at 11:45 am. Patient informed.

## 2013-06-03 NOTE — Telephone Encounter (Signed)
Called complaining of chest pressure for several weeks. Had Echocardiogram recently showing no significant change. Saw Dr. Mayford Knifeurner in mid January and was not c/o chest pain at the time. Will see if cardiology can evaluate.

## 2013-06-04 ENCOUNTER — Ambulatory Visit (INDEPENDENT_AMBULATORY_CARE_PROVIDER_SITE_OTHER): Payer: Medicare Other | Admitting: Nurse Practitioner

## 2013-06-04 ENCOUNTER — Encounter: Payer: Self-pay | Admitting: Nurse Practitioner

## 2013-06-04 VITALS — BP 160/80 | HR 83 | Ht 63.0 in | Wt 128.8 lb

## 2013-06-04 DIAGNOSIS — R05 Cough: Secondary | ICD-10-CM

## 2013-06-04 DIAGNOSIS — R059 Cough, unspecified: Secondary | ICD-10-CM

## 2013-06-04 DIAGNOSIS — R079 Chest pain, unspecified: Secondary | ICD-10-CM

## 2013-06-04 DIAGNOSIS — I350 Nonrheumatic aortic (valve) stenosis: Secondary | ICD-10-CM

## 2013-06-04 DIAGNOSIS — I359 Nonrheumatic aortic valve disorder, unspecified: Secondary | ICD-10-CM

## 2013-06-04 LAB — HEPATIC FUNCTION PANEL
ALT: 21 U/L (ref 0–53)
AST: 41 U/L — ABNORMAL HIGH (ref 0–37)
Albumin: 4 g/dL (ref 3.5–5.2)
Alkaline Phosphatase: 73 U/L (ref 39–117)
Bilirubin, Direct: 0 mg/dL (ref 0.0–0.3)
Total Bilirubin: 0.9 mg/dL (ref 0.3–1.2)
Total Protein: 6.8 g/dL (ref 6.0–8.3)

## 2013-06-04 LAB — CBC
HCT: 35.5 % — ABNORMAL LOW (ref 39.0–52.0)
Hemoglobin: 11.6 g/dL — ABNORMAL LOW (ref 13.0–17.0)
MCHC: 32.6 g/dL (ref 30.0–36.0)
MCV: 106.5 fl — ABNORMAL HIGH (ref 78.0–100.0)
Platelets: 251 10*3/uL (ref 150.0–400.0)
RBC: 3.34 Mil/uL — ABNORMAL LOW (ref 4.22–5.81)
RDW: 13.4 % (ref 11.5–14.6)
WBC: 8.8 10*3/uL (ref 4.5–10.5)

## 2013-06-04 LAB — BASIC METABOLIC PANEL
BUN: 33 mg/dL — ABNORMAL HIGH (ref 6–23)
CO2: 25 mEq/L (ref 19–32)
Calcium: 9.5 mg/dL (ref 8.4–10.5)
Chloride: 103 mEq/L (ref 96–112)
Creatinine, Ser: 1.4 mg/dL (ref 0.4–1.5)
GFR: 50.79 mL/min — ABNORMAL LOW (ref >60.00)
Glucose, Bld: 132 mg/dL — ABNORMAL HIGH (ref 70–99)
Potassium: 4.5 mEq/L (ref 3.5–5.1)
Sodium: 135 mEq/L (ref 135–145)

## 2013-06-04 LAB — TROPONIN I: Troponin I: <0.3 ng/mL (ref <0.30)

## 2013-06-04 NOTE — Progress Notes (Signed)
Wende Neighbors Date of Birth: April 15, 1915 Medical Record #[id number]  History of Present Illness: Mr. Lambson is seen back today for a work in visit. Seen for Dr. Mayford Knife at the request of Dr. Lenord Fellers. He is a 80 year old male with underlying PPM in place due to syncope from SSS, HTN, valvular heart disease, hypothyroidism and mild pulmonary HTN. Is maintained on chronic Plavix for past stroke.   Was seen here about 6 weeks ago - echo updated - that was ok. Noted to be doing ok at that visit.   Received call yesterday from PCP - having had 5 to 6 weeks of ongoing chest pain. Refused to go to the ER. Has had recent URI and CXR showed a nodule - CT ordered but does not look like it was ever done.   Comes back today. Here with his wife, Hale Bogus. They both note that he has had this constant discomfort under the left breast that has been present over the last 5 to 6 weeks. Started after his bronchitis/URI which was associated with significant coughing. He is not short of breath. He notes that it does not change in intensity, nothing makes it worse and nothing makes it better. It is there when he wakes up and there when he goes to sleep. Not exertional. No associated symptoms. Wife notes that they did not pursue the CT due to his age and the fact that if something were there, they would opt for no treatment.    Current Outpatient Prescriptions  Medication Sig Dispense Refill  . Ascorbic Acid (VITAMIN C) 1000 MG tablet Take 1,000 mg by mouth daily.      . clopidogrel (PLAVIX) 75 MG tablet TAKE ONE (1) TABLET BY MOUTH EVERY DAY  30 tablet  11  . Diphenhydramine-APAP, sleep, (TYLENOL PM EXTRA STRENGTH PO) Take 1-2 tablets by mouth at bedtime as needed. For sleep      . levothyroxine (SYNTHROID, LEVOTHROID) 100 MCG tablet Take 1 tablet (100 mcg total) by mouth daily.  90 tablet  3  . lisinopril (PRINIVIL,ZESTRIL) 10 MG tablet Take 20 mg by mouth daily.      . Multiple Vitamins-Minerals (MULTIVITAMINS THER.  W/MINERALS) TABS Take 1 tablet by mouth daily.      . pravastatin (PRAVACHOL) 40 MG tablet Take 1 tablet (40 mg total) by mouth at bedtime.  90 tablet  3  . Sennosides (EX-LAX) 15 MG TABS Take 15 mg by mouth at bedtime as needed. For constipation       No current facility-administered medications for this visit.    Allergies  Allergen Reactions  . Sulfa Antibiotics Rash    Past Medical History  Diagnosis Date  . Back pain   . Hypertension   . Hypercholesteremia   . Hypothyroidism   . Stroke 02/2012    tia  . History of CHF (congestive heart failure)   . Hemorrhoids   . DDD (degenerative disc disease)     of the spine  . Kidney stones   . Nephritis     at age 59  . Bradycardia     with snycope  . SSS (sick sinus syndrome)     in setting of syncope s/p PPM    Past Surgical History  Procedure Laterality Date  . Knee surgery      left knee  . Joint replacement      Left knee replacement  . Insert / replace / remove pacemaker      History  Smoking  status  . Former Smoker  . Types: Pipe  Smokeless tobacco  . Never Used    History  Alcohol Use  . 0.6 oz/week  . 1 Glasses of wine per week    Family History  Problem Relation Age of Onset  . Kidney disease Mother   . Kidney disease Father     Review of Systems: The review of systems is per the HPI.  All other systems were reviewed and are negative.  Physical Exam: BP 160/80  Pulse 83  Ht 5\' 3"  (1.6 m)  Wt 128 lb 12.8 oz (58.423 kg)  BMI 22.82 kg/m2  SpO2 97% Patient is an elderly male who is in no acute distress. He is thin. Little hard of hearing. Skin is warm and dry. Color is normal.  HEENT is unremarkable. Normocephalic/atraumatic. PERRL. Sclera are nonicteric. Neck is supple. No masses. No JVD. Lungs are clear. Cardiac exam shows a regular rate and rhythm. Outflow murmur noted. No palpable chest wall pain elicited. Abdomen is soft. Extremities are without edema. Gait and ROM are intact. No gross  neurologic deficits noted.  Wt Readings from Last 3 Encounters:  06/04/13 128 lb 12.8 oz (58.423 kg)  04/24/13 127 lb 12.8 oz (57.97 kg)  04/16/13 129 lb 8 oz (58.741 kg)     LABORATORY DATA: EKG today shows sinus, 1st degree AV block, bifascicular block - subtle T wave changes in V2.   Lab Results  Component Value Date   WBC 10.1 09/04/2012   HGB 12.2* 09/04/2012   HCT 35.8* 09/04/2012   PLT 262 09/04/2012   GLUCOSE 103* 09/04/2012   CHOL 118 10/02/2012   TRIG 51 10/02/2012   HDL 54 10/02/2012   LDLCALC 54 10/02/2012   ALT 16 10/02/2012   AST 26 10/02/2012   NA 138 09/04/2012   K 4.3 09/04/2012   CL 105 09/04/2012   CREATININE 1.26 09/04/2012   BUN 30* 09/04/2012   CO2 27 09/04/2012   TSH 2.164 10/01/2011   INR 0.98 02/28/2012   HGBA1C 4.9 02/17/2012    Echo Study Conclusions from February 2015  - Left ventricle: The cavity size was normal. There was moderate concentric hypertrophy. Systolic function was normal. The estimated ejection fraction was in the range of 60% to 65%. Wall motion was normal; there were no regional wall motion abnormalities. Doppler parameters are consistent with abnormal left ventricular relaxation (grade 1 diastolic dysfunction). The E/e' ratio is >10, suggesting elevated LV filling pressure. - Aortic valve: Calcified leaflets with restricted motion. There is mild aortic stenosis. Peak and mean gradients are32 mmHg and 19 mmHg, respectively. Based on an LVOT diameter of 2.3 cm, the calculated AVA is around 1.5-1.6 cm2. Mild regurgitation. Mean gradient: 19mm Hg (S). Peak gradient: 32mm Hg (S). - Mitral valve: Posterior calcified annulus. Mild regurgitation. - Right ventricle: RV systolic pressure: 42mm Hg (S, est). - Tricuspid valve: Hypermobile, possibly flail tricuspid leaflet (Image 1-15) with moderate regurgitation. - Inferior vena cava: The vessel was normal in size; the respirophasic diameter changes were in the normal range (= 50%); findings are consistent with  normal central venous pressure.   CXR IMPRESSION: 1. Cannot exclude a subtle pulmonary nodule in the right upper lobe. CT chest is recommended for further workup. 2. Calcified granuloma in the left lower lobe, stable. 3. Atherosclerosis. 4. Severe degenerative arthropathy of the left glenohumeral joint. 5. Thoracolumbar scoliosis and spondylosis.   Electronically Signed By: Herbie BaltimoreWalt Liebkemann M.D. On: 04/16/2013 12:35  Assessment / Plan: 1. Chest pain - no known CAD but presumed just based on age/gender/vascular disease - his symptoms seem quite atypical to me in that this has been a constant discomfort for 6 weeks - they favor conservative approach - would use some Tylenol during the day (already uses at night). Check labs today to include troponin. I suspect this is from his prior URI.  2. Valvular heart disease - recent echo done - following for now with no need for intervention  3. Abnormal CXR - opted to not proceed with the CT - I think this is acceptable.   4. Advanced age   Patient is agreeable to this plan and will call if any problems develop in the interim.   Rosalio Macadamia, RN, ANP-C [facility] [address] [place], [place]  [address] [phone]

## 2013-06-04 NOTE — Patient Instructions (Signed)
Let's check some labs today - I am going to include a set of cardiac enzymes  Would encourage using Tylenol during the day as well as night  Stay on your current medicines  Call the [facility] office at [phone] if you have any questions, problems or concerns.

## 2013-06-11 ENCOUNTER — Ambulatory Visit (INDEPENDENT_AMBULATORY_CARE_PROVIDER_SITE_OTHER): Payer: Medicare Other | Admitting: Internal Medicine

## 2013-06-11 ENCOUNTER — Encounter: Payer: Self-pay | Admitting: Internal Medicine

## 2013-06-11 VITALS — BP 120/68 | HR 80 | Ht 63.0 in | Wt 127.4 lb

## 2013-06-11 DIAGNOSIS — I495 Sick sinus syndrome: Secondary | ICD-10-CM

## 2013-06-11 LAB — MDC_IDC_ENUM_SESS_TYPE_INCLINIC
Battery Remaining Longevity: 106.8 mo
Battery Voltage: 2.95 V
Brady Statistic RA Percent Paced: 4.7 %
Implantable Pulse Generator Serial Number: 7350973
Lead Channel Impedance Value: 375 Ohm
Lead Channel Impedance Value: 412.5 Ohm
Lead Channel Pacing Threshold Pulse Width: 0.4 ms
Lead Channel Setting Pacing Amplitude: 1.125
Lead Channel Setting Pacing Amplitude: 1.75 V
Lead Channel Setting Pacing Pulse Width: 0.4 ms
Lead Channel Setting Sensing Sensitivity: 2 mV
MDC IDC MSMT LEADCHNL RA PACING THRESHOLD AMPLITUDE: 0.75 V
MDC IDC MSMT LEADCHNL RA PACING THRESHOLD PULSEWIDTH: 0.4 ms
MDC IDC MSMT LEADCHNL RA SENSING INTR AMPL: 2.1 mV
MDC IDC MSMT LEADCHNL RV PACING THRESHOLD AMPLITUDE: 0.875 V
MDC IDC MSMT LEADCHNL RV SENSING INTR AMPL: 12 mV
MDC IDC SESS DTM: 20150310103858
MDC IDC STAT BRADY RV PERCENT PACED: 11 %

## 2013-06-11 NOTE — Patient Instructions (Signed)

## 2013-06-12 ENCOUNTER — Encounter: Payer: Self-pay | Admitting: Internal Medicine

## 2013-06-12 ENCOUNTER — Ambulatory Visit: Payer: Medicare Other | Admitting: Neurology

## 2013-06-12 NOTE — Progress Notes (Signed)
HPI [name] is referred today by Dr. Eldridge DaceVaranasi for evaluation and management of his PPM. He is a very pleasant elderly man with a h/o symptomatic bradycardia, s/p PPM insertion. He has aortic stenosis, HTN, and dyslipidemia. He has chronic class 3 renal insufficiency.  Allergies  Allergen Reactions  . Sulfa Antibiotics Rash     Current Outpatient Prescriptions  Medication Sig Dispense Refill  . Ascorbic Acid (VITAMIN C) 1000 MG tablet Take 1,000 mg by mouth daily.      . clopidogrel (PLAVIX) 75 MG tablet TAKE ONE (1) TABLET BY MOUTH EVERY DAY  30 tablet  11  . Diphenhydramine-APAP, sleep, (TYLENOL PM EXTRA STRENGTH PO) Take 1-2 tablets by mouth at bedtime as needed. For sleep      . levothyroxine (SYNTHROID, LEVOTHROID) 100 MCG tablet Take 1 tablet (100 mcg total) by mouth daily.  90 tablet  3  . lisinopril (PRINIVIL,ZESTRIL) 10 MG tablet Take 20 mg by mouth daily.      . Multiple Vitamins-Minerals (MULTIVITAMINS THER. W/MINERALS) TABS Take 1 tablet by mouth daily.      . pravastatin (PRAVACHOL) 40 MG tablet Take 1 tablet (40 mg total) by mouth at bedtime.  90 tablet  3  . Sennosides (EX-LAX) 15 MG TABS Take 15 mg by mouth at bedtime as needed. For constipation       No current facility-administered medications for this visit.     Past Medical History  Diagnosis Date  . Back pain   . Hypertension   . Hypercholesteremia   . Hypothyroidism   . Stroke 02/2012    tia  . History of CHF (congestive heart failure)   . Hemorrhoids   . DDD (degenerative disc disease)     of the spine  . Kidney stones   . Nephritis     at age 80  . Bradycardia     with snycope  . SSS (sick sinus syndrome)     in setting of syncope s/p PPM    ROS:   All systems reviewed and negative except as noted in the HPI.   Past Surgical History  Procedure Laterality Date  . Knee surgery      left knee  . Joint replacement      Left knee replacement  . Insert / replace / remove pacemaker        Family History  Problem Relation Age of Onset  . Kidney disease Mother   . Kidney disease Father      History   Social History  . Marital Status: Married    Spouse Name: N/A    Number of Children: N/A  . Years of Education: N/A   Occupational History  . Not on file.   Social History Main Topics  . Smoking status: Former Smoker    Types: Pipe  . Smokeless tobacco: Never Used  . Alcohol Use: 0.6 oz/week    1 Glasses of wine per week  . Drug Use: No  . Sexual Activity: No   Other Topics Concern  . Not on file   Social History Narrative  . No narrative on file     BP 120/68  Pulse 80  Ht 5\' 3"  (1.6 m)  Wt 127 lb 6.4 oz (57.788 kg)  BMI 22.57 kg/m2  Physical Exam:  Well appearing elderly man, NAD HEENT: Unremarkable Neck:  No JVD, no thyromegally Back:  No CVA tenderness Lungs:  Clear with no wheezes HEART:  Regular rate rhythm,  no murmurs, no rubs, no clicks Abd:  soft, positive bowel sounds, no organomegally, no rebound, no guarding Ext:  2 plus pulses, no edema, no cyanosis, no clubbing Skin:  No rashes no nodules Neuro:  CN II through XII intact, motor grossly intact  EKG - nsr  DEVICE  Normal device function.  See PaceArt for details.   Assess/Plan:

## 2013-06-19 ENCOUNTER — Encounter (INDEPENDENT_AMBULATORY_CARE_PROVIDER_SITE_OTHER): Payer: Self-pay

## 2013-06-19 ENCOUNTER — Encounter: Payer: Self-pay | Admitting: Neurology

## 2013-06-19 ENCOUNTER — Ambulatory Visit (INDEPENDENT_AMBULATORY_CARE_PROVIDER_SITE_OTHER): Payer: Medicare Other | Admitting: Neurology

## 2013-06-19 VITALS — BP 147/72 | HR 83 | Wt 128.0 lb

## 2013-06-19 DIAGNOSIS — I6789 Other cerebrovascular disease: Secondary | ICD-10-CM

## 2013-06-19 NOTE — Patient Instructions (Signed)
I had a long discussion with the patient and wife regarding his stroke risk factors, secondary stroke prevention and answered questions. Continue Plavix for stroke prevention and check yearly carotid ultrasound studies and the profile. Strict control of hypertension with blood pressure goal below 130/90 and lipids with LDL cholesterol goal below 120 mg percent. Return for followup in the future only as necessary.

## 2013-06-19 NOTE — Progress Notes (Signed)
HPI: Initial Visit 06/26/2012 : [name] is a 58 year Caucasian male who is seen for followup after hospital consultation for TIA on 02/16/12. He presented with sudden onset of left hand weakness and numbness while sitting at his desk. He was also found to be slightly confused. His symptoms improved quickly over the next hour and a half and recovered completely by the time he reached the hospital. CT head was unremarkable. MRI could not be done because he has a pacemaker. Carotid Doppler showed no significant extracranial stenosis. Transthoracic echo showed ejection fraction of 55-60% with normal wall motion. Mild to moderate aortic stenosis was noted. Vascular risk factors identified included hypertension and hyperlipidemia. Lipid profile was normal. Hb A1c was normal. He was changed from aspirin to Plavix for secondary stroke prevention and states he has done well. He is back to all his activities of daily living and has no physical deficits from his stroke. I He is tolerating clopidogrel without significant bleeding but has had bruising on his skin. Is tolerating protocol as well without significant muscle aches or pains. He has given up his driving but does not seem to be too disappointed.He is an Consulting civil engineer. He does not get much physical activity and does not go out. He has seen his primary physician in January this year and had lab work done which was normal. Update 12/27/2012 He returns for f/u after last visit 06/26/2012.He is doing well without any stroke or TIA symptoms.He had leg pain for which he saw his primary MD who felt this may be related to his degenerative spine disease. He does not want surgery.He is tolerating plavix without bleeding and pravachol without myalgias. He has no complaints today. Update 06/19/2013 : He returns for followup of her last visit 6 months ago He continues to do well without recurrent stroke or TIA symptoms. He complains of some blurred vision but this may  be related to his macular degeneration and he has not seen his ophthalmologist for some time. His tolerating Plavix without bleeding, bruising or other side effects. His medications remain unchanged. He had followup carotid ultrasound done on 12/27/12 which showed no significant extracranial stenosis. He has upcoming physical with his primary physician and plans to have his cholesterol checked at that visit. His blood pressure is elevated today at 147/72 but wife informs me that last week it was 120/80 at another physician's office. He has no new complaints today ROS: 14 system review of systems is positive for no complaints Physical Exam General: well developed, well nourished frail [place] n male seated, in no evident distress Head: head normocephalic and atraumatic. Orohparynx benign Neck: supple with bilateral carotid   bruits Cardiovascular: regular rate and rhythm, soft ejection systolic murmur Musculoskeletal ; mild kyphosis Skin; pacemaker infraclavicular Filed Vitals:   06/19/13 1503  BP: 147/72  Pulse: 83    Neurologic Exam Mental Status: Awake and fully alert. Oriented to place and time. Recent and remote memory mildly diminishedt. Attention span, concentration and fund of knowledge appropriate. Mood and affect appropriate.  Cranial Nerves: Fundoscopic exam reveals sharp disc margins. Pupils unequal left larger than right, briskly reactive to light. Extraocular movements full without nystagmus. Visual fields full to confrontation. Hearing intact  . Facial sensation intact. Face, tongue, palate move normally and symmetrically. Neck flexion and extension normal.  Motor: Normal bulk and tone. Normal strength in all tested extremity muscles. Sensory.: intact to tough and pinprick and vibratory.  Coordination: Rapid alternating movements normal in  all extremities. Finger-to-nose and heel-to-shin performed accurately bilaterally. Gait and Station: Arises from chair without difficulty.  Stance shows mild kyphosisl. Gait demonstrates normal stride length and balance . Able to heel, toe and tandem walk without difficulty.  Reflexes: 1+ and symmetric. Toes downgoing.     ASSESSMENT:: 89 year With right hemispheric TIA in November 2013 with vascular risk factors of age, hypertension and hyperlipidemia.  PLAN:I had a long discussion with the patient and wife regarding his stroke risk factors, secondary stroke prevention and answered questions. Continue Plavix for stroke prevention and check yearly carotid ultrasound studies and the profile. Strict control of hypertension with blood pressure goal below 130/90 and lipids with LDL cholesterol goal below 120 mg percent. Return for followup in the future only as necessary.

## 2013-06-25 ENCOUNTER — Telehealth: Payer: Self-pay | Admitting: Internal Medicine

## 2013-06-25 NOTE — Telephone Encounter (Signed)
Left message on home # that patient has appointment with Dr. Vonna KotykBevis [phone]) on Tuesday, 3/31 @ 2:45 p.m.  They will work patient in.  Patient instructed to call us back if they have any questions or if condition deteriorates any further.

## 2013-07-04 ENCOUNTER — Encounter: Payer: Self-pay | Admitting: Internal Medicine

## 2013-07-16 ENCOUNTER — Other Ambulatory Visit: Payer: Self-pay | Admitting: Internal Medicine

## 2013-07-22 ENCOUNTER — Emergency Department (HOSPITAL_COMMUNITY): Payer: Medicare Other

## 2013-07-22 ENCOUNTER — Telehealth: Payer: Self-pay | Admitting: Internal Medicine

## 2013-07-22 ENCOUNTER — Emergency Department (HOSPITAL_COMMUNITY)
Admission: EM | Admit: 2013-07-22 | Discharge: 2013-07-22 | Disposition: A | Discharge status: Home / Self Care | Payer: Medicare Other | Attending: Emergency Medicine | Admitting: Emergency Medicine

## 2013-07-22 ENCOUNTER — Encounter (HOSPITAL_COMMUNITY): Payer: Self-pay | Admitting: Emergency Medicine

## 2013-07-22 DIAGNOSIS — Z7902 Long term (current) use of antithrombotics/antiplatelets: Secondary | ICD-10-CM | POA: Insufficient documentation

## 2013-07-22 DIAGNOSIS — R079 Chest pain, unspecified: Secondary | ICD-10-CM | POA: Insufficient documentation

## 2013-07-22 DIAGNOSIS — Z87442 Personal history of urinary calculi: Secondary | ICD-10-CM | POA: Insufficient documentation

## 2013-07-22 DIAGNOSIS — Z87448 Personal history of other diseases of urinary system: Secondary | ICD-10-CM | POA: Insufficient documentation

## 2013-07-22 DIAGNOSIS — E7800 Pure hypercholesterolemia, unspecified: Secondary | ICD-10-CM | POA: Insufficient documentation

## 2013-07-22 DIAGNOSIS — Z79899 Other long term (current) drug therapy: Secondary | ICD-10-CM | POA: Insufficient documentation

## 2013-07-22 DIAGNOSIS — E039 Hypothyroidism, unspecified: Secondary | ICD-10-CM | POA: Insufficient documentation

## 2013-07-22 DIAGNOSIS — I10 Essential (primary) hypertension: Secondary | ICD-10-CM | POA: Insufficient documentation

## 2013-07-22 DIAGNOSIS — Z950 Presence of cardiac pacemaker: Secondary | ICD-10-CM | POA: Insufficient documentation

## 2013-07-22 DIAGNOSIS — I509 Heart failure, unspecified: Secondary | ICD-10-CM | POA: Insufficient documentation

## 2013-07-22 DIAGNOSIS — Z87891 Personal history of nicotine dependence: Secondary | ICD-10-CM | POA: Insufficient documentation

## 2013-07-22 DIAGNOSIS — Z8739 Personal history of other diseases of the musculoskeletal system and connective tissue: Secondary | ICD-10-CM | POA: Insufficient documentation

## 2013-07-22 DIAGNOSIS — Z8673 Personal history of transient ischemic attack (TIA), and cerebral infarction without residual deficits: Secondary | ICD-10-CM | POA: Insufficient documentation

## 2013-07-22 LAB — CBC
HEMATOCRIT: 36.3 % — AB (ref 39.0–52.0)
HEMOGLOBIN: 12 g/dL — AB (ref 13.0–17.0)
MCH: 34.4 pg — ABNORMAL HIGH (ref 26.0–34.0)
MCHC: 33.1 g/dL (ref 30.0–36.0)
MCV: 104 fL — ABNORMAL HIGH (ref 78.0–100.0)
Platelets: 229 10*3/uL (ref 150–400)
RBC: 3.49 MIL/uL — ABNORMAL LOW (ref 4.22–5.81)
RDW: 13 % (ref 11.5–15.5)
WBC: 8.5 10*3/uL (ref 4.0–10.5)

## 2013-07-22 LAB — BASIC METABOLIC PANEL
BUN: 44 mg/dL — AB (ref 6–23)
CHLORIDE: 109 meq/L (ref 96–112)
CO2: 25 mEq/L (ref 19–32)
Calcium: 9.9 mg/dL (ref 8.4–10.5)
Creatinine, Ser: 1.38 mg/dL — ABNORMAL HIGH (ref 0.50–1.35)
GFR calc Af Amer: 48 mL/min — ABNORMAL LOW (ref >90)
GFR calc non Af Amer: 42 mL/min — ABNORMAL LOW (ref >90)
GLUCOSE: 117 mg/dL — AB (ref 70–99)
Potassium: 4.5 mEq/L (ref 3.7–5.3)
SODIUM: 146 meq/L (ref 137–147)

## 2013-07-22 LAB — I-STAT TROPONIN, ED: Troponin i, poc: 0 ng/mL (ref 0.00–0.08)

## 2013-07-22 LAB — TROPONIN I: Troponin I: <0.3 ng/mL (ref <0.30)

## 2013-07-22 NOTE — ED Provider Notes (Signed)
CSN: [id number]     Arrival date & time 07/22/13  1607 History   First MD Initiated Contact with Patient 07/22/13 1804     Chief Complaint  Patient presents with  . Chest Pain  . Shortness of Breath      HPI Patient reports approximately 5 hours of left-sided chest discomfort that radiated to his left scapula.  Family reports that he has had this many times before and is seeing his cardiologist as well as his EP team and no etiology for this symptom has ever been found.  The patient is turning 80 years old tomorrow.  His otherwise been in his normal state of health recently.  He states resolution of all of his chest discomfort upon arrival to the ER.  He denies associated shortness of breath.  His had no cough or congestion.  No fevers or chills.  He's had no associated syncope or palpitations.  Denies abdominal pain.  No back pain.  No recent injury or trauma or fall.   Past Medical History  Diagnosis Date  . Back pain   . Hypertension   . Hypercholesteremia   . Hypothyroidism   . Stroke 02/2012    tia  . History of CHF (congestive heart failure)   . Hemorrhoids   . DDD (degenerative disc disease)     of the spine  . Kidney stones   . Nephritis     at age 80  . Bradycardia     with snycope  . SSS (sick sinus syndrome)     in setting of syncope s/p PPM   Past Surgical History  Procedure Laterality Date  . Knee surgery      left knee  . Joint replacement      Left knee replacement  . Insert / replace / remove pacemaker     Family History  Problem Relation Age of Onset  . Kidney disease Mother   . Kidney disease Father    History  Substance Use Topics  . Smoking status: Former Smoker    Types: Pipe  . Smokeless tobacco: Never Used  . Alcohol Use: 0.6 oz/week    1 Glasses of wine per week    Review of Systems  All other systems reviewed and are negative.     Allergies  Sulfa antibiotics  Home Medications   Prior to Admission medications   Medication  Sig Start Date End Date Taking? Authorizing Provider  Ascorbic Acid (VITAMIN C) 1000 MG tablet Take 1,000 mg by mouth daily.   Yes Historical Provider, MD  clopidogrel (PLAVIX) 75 MG tablet TAKE ONE (1) TABLET BY MOUTH EVERY DAY 01/17/13  Yes Margaree MackintoshMary J [name], MD  Diphenhydramine-APAP, sleep, (TYLENOL PM EXTRA STRENGTH PO) Take 1-2 tablets by mouth at bedtime as needed. For sleep   Yes Historical Provider, MD  levothyroxine (SYNTHROID, LEVOTHROID) 100 MCG tablet Take 1 tablet (100 mcg total) by mouth daily. 07/17/12  Yes Margaree MackintoshMary J [name], MD  lisinopril (PRINIVIL,ZESTRIL) 10 MG tablet Take 20 mg by mouth daily. 07/17/12  Yes Margaree MackintoshMary J [name], MD  Multiple Vitamins-Minerals (MULTIVITAMINS THER. W/MINERALS) TABS Take 1 tablet by mouth daily.   Yes Historical Provider, MD  pravastatin (PRAVACHOL) 40 MG tablet Take 1 tablet (40 mg total) by mouth at bedtime. 07/17/12  Yes Margaree MackintoshMary J [name], MD  Sennosides (EX-LAX) 15 MG TABS Take 15 mg by mouth at bedtime as needed. For constipation   Yes Historical Provider, MD   BP 166/74  Pulse 87  Temp(Src) 97.9 F (36.6 C) (Oral)  Resp 18  Wt 123 lb (55.792 kg)  SpO2 99% Physical Exam  Nursing note and vitals reviewed. Constitutional: He is oriented to person, place, and time. He appears well-developed and well-nourished.  HENT:  Head: Normocephalic and atraumatic.  Eyes: EOM are normal.  Neck: Normal range of motion.  Cardiovascular: Normal rate, regular rhythm, normal heart sounds and intact distal pulses.   Pulmonary/Chest: Effort normal and breath sounds normal. No respiratory distress.  Abdominal: Soft. He exhibits no distension. There is no tenderness.  Musculoskeletal: Normal range of motion.  Neurological: He is alert and oriented to person, place, and time.  Skin: Skin is warm and dry.  Psychiatric: He has a normal mood and affect. Judgment normal.    ED Course  Procedures (including critical care time) Labs Review Labs Reviewed  CBC - Abnormal;  Notable for the following:    RBC 3.49 (*)    Hemoglobin 12.0 (*)    HCT 36.3 (*)    MCV 104.0 (*)    MCH 34.4 (*)    All other components within normal limits  BASIC METABOLIC PANEL - Abnormal; Notable for the following:    Glucose, Bld 117 (*)    BUN 44 (*)    Creatinine, Ser 1.38 (*)    GFR calc non Af Amer 42 (*)    GFR calc Af Amer 48 (*)    All other components within normal limits  TROPONIN I  I-STAT TROPOININ, ED    Imaging Review Dg Chest 2 View  07/22/2013   CLINICAL DATA:  Chest pain  EXAM: CHEST  2 VIEW  COMPARISON:  04/16/2013  FINDINGS: Cardiomediastinal silhouette is stable. Dextroscoliosis thoracic spine. Dual lead cardiac pacemaker is unchanged in position. Stable calcified granuloma left lower lobe. No acute infiltrate or pulmonary edema. Degenerative changes thoracic spine.  IMPRESSION: No active cardiopulmonary disease.   Electronically Signed   By: Natasha MeadLiviu  Pop M.D.   On: 07/22/2013 18:29    ECG interpretation  Date: 07/23/2013  Rate: 80  Rhythm: normal sinus rhythm  QRS Axis: normal  Intervals: normal  ST/T Wave abnormalities: normal  Conduction Disutrbances: none  Narrative Interpretation:   Old EKG Reviewed: No significant changes noted      MDM   Final diagnoses:  Chest pain    Chest pain free at this time. Well appearing. Doubt ACS. pcp and cardiology follow up    Lyanne CoKevin M Maebell Lyvers, MD 07/23/13 [phone]

## 2013-07-22 NOTE — ED Notes (Signed)
Pt reports CP since he woke up this morning 0630. C/o left side chest pressure with radiation to left side of neck. Denies any cardiac hx . Also, SOB. States he has a pacemaker. Pt is a x 4. Skin warm and dry.

## 2013-07-22 NOTE — ED Notes (Signed)
Dr. Campos at bedside   

## 2013-07-23 NOTE — Telephone Encounter (Signed)
Dr. Lenord FellersBaxley verbally advised.

## 2013-07-30 ENCOUNTER — Other Ambulatory Visit: Payer: Self-pay | Admitting: Internal Medicine

## 2013-07-30 ENCOUNTER — Other Ambulatory Visit: Payer: Medicare Other | Admitting: Internal Medicine

## 2013-07-30 DIAGNOSIS — E785 Hyperlipidemia, unspecified: Secondary | ICD-10-CM

## 2013-07-30 DIAGNOSIS — Z0000 Encounter for general adult medical examination without abnormal findings: Secondary | ICD-10-CM

## 2013-07-30 DIAGNOSIS — I10 Essential (primary) hypertension: Secondary | ICD-10-CM

## 2013-07-30 LAB — CBC WITH DIFFERENTIAL/PLATELET
BASOS ABS: 0.1 10*3/uL (ref 0.0–0.1)
Basophils Relative: 1 % (ref 0–1)
EOS ABS: 0.3 10*3/uL (ref 0.0–0.7)
Eosinophils Relative: 3 % (ref 0–5)
HCT: 34.9 % — ABNORMAL LOW (ref 39.0–52.0)
HEMOGLOBIN: 11.5 g/dL — AB (ref 13.0–17.0)
Lymphocytes Relative: 28 % (ref 12–46)
Lymphs Abs: 2.4 10*3/uL (ref 0.7–4.0)
MCH: 33.9 pg (ref 26.0–34.0)
MCHC: 33 g/dL (ref 30.0–36.0)
MCV: 102.9 fL — ABNORMAL HIGH (ref 78.0–100.0)
MONOS PCT: 8 % (ref 3–12)
Monocytes Absolute: 0.7 10*3/uL (ref 0.1–1.0)
NEUTROS ABS: 5 10*3/uL (ref 1.7–7.7)
NEUTROS PCT: 60 % (ref 43–77)
Platelets: 242 10*3/uL (ref 150–400)
RBC: 3.39 MIL/uL — ABNORMAL LOW (ref 4.22–5.81)
RDW: 13.3 % (ref 11.5–15.5)
WBC: 8.4 10*3/uL (ref 4.0–10.5)

## 2013-07-30 LAB — LIPID PANEL
Cholesterol: 145 mg/dL (ref 0–200)
HDL: 55 mg/dL (ref >39)
LDL CALC: 76 mg/dL (ref 0–99)
TRIGLYCERIDES: 71 mg/dL (ref <150)
Total CHOL/HDL Ratio: 2.6 Ratio
VLDL: 14 mg/dL (ref 0–40)

## 2013-07-30 LAB — COMPREHENSIVE METABOLIC PANEL
ALBUMIN: 3.9 g/dL (ref 3.5–5.2)
ALK PHOS: 75 U/L (ref 39–117)
ALT: 20 U/L (ref 0–53)
AST: 37 U/L (ref 0–37)
BILIRUBIN TOTAL: 0.8 mg/dL (ref 0.2–1.2)
BUN: 30 mg/dL — ABNORMAL HIGH (ref 6–23)
CO2: 28 mEq/L (ref 19–32)
Calcium: 9.2 mg/dL (ref 8.4–10.5)
Chloride: 106 mEq/L (ref 96–112)
Creat: 1.15 mg/dL (ref 0.50–1.35)
Glucose, Bld: 86 mg/dL (ref 70–99)
POTASSIUM: 4.4 meq/L (ref 3.5–5.3)
SODIUM: 142 meq/L (ref 135–145)
Total Protein: 6.1 g/dL (ref 6.0–8.3)

## 2013-07-31 LAB — PSA, MEDICARE: PSA: 1.41 ng/mL (ref <=4.00)

## 2013-08-01 ENCOUNTER — Encounter: Payer: Self-pay | Admitting: Internal Medicine

## 2013-08-01 ENCOUNTER — Ambulatory Visit (INDEPENDENT_AMBULATORY_CARE_PROVIDER_SITE_OTHER): Payer: Medicare Other | Admitting: Internal Medicine

## 2013-08-01 VITALS — BP 148/74 | HR 80 | Temp 98.2°F | Ht 62.0 in | Wt 129.0 lb

## 2013-08-01 DIAGNOSIS — L89109 Pressure ulcer of unspecified part of back, unspecified stage: Secondary | ICD-10-CM

## 2013-08-01 DIAGNOSIS — L89209 Pressure ulcer of unspecified hip, unspecified stage: Secondary | ICD-10-CM

## 2013-08-01 DIAGNOSIS — L8991 Pressure ulcer of unspecified site, stage 1: Secondary | ICD-10-CM

## 2013-08-01 DIAGNOSIS — Z1211 Encounter for screening for malignant neoplasm of colon: Secondary | ICD-10-CM

## 2013-08-01 DIAGNOSIS — R21 Rash and other nonspecific skin eruption: Secondary | ICD-10-CM

## 2013-08-01 DIAGNOSIS — E039 Hypothyroidism, unspecified: Secondary | ICD-10-CM

## 2013-08-01 DIAGNOSIS — Z0000 Encounter for general adult medical examination without abnormal findings: Secondary | ICD-10-CM

## 2013-08-01 DIAGNOSIS — L89151 Pressure ulcer of sacral region, stage 1: Secondary | ICD-10-CM

## 2013-08-01 DIAGNOSIS — I10 Essential (primary) hypertension: Secondary | ICD-10-CM

## 2013-08-01 DIAGNOSIS — E785 Hyperlipidemia, unspecified: Secondary | ICD-10-CM

## 2013-08-01 DIAGNOSIS — H3530 Unspecified macular degeneration: Secondary | ICD-10-CM

## 2013-08-01 DIAGNOSIS — Z8673 Personal history of transient ischemic attack (TIA), and cerebral infarction without residual deficits: Secondary | ICD-10-CM

## 2013-08-01 DIAGNOSIS — L89211 Pressure ulcer of right hip, stage 1: Secondary | ICD-10-CM

## 2013-08-01 LAB — POCT URINALYSIS DIPSTICK
Bilirubin, UA: NEGATIVE
Glucose, UA: NEGATIVE
Ketones, UA: NEGATIVE
Nitrite, UA: NEGATIVE
Protein, UA: NEGATIVE
RBC UA: NEGATIVE
Spec Grav, UA: 1.02
UROBILINOGEN UA: NEGATIVE
pH, UA: 5.5

## 2013-08-01 LAB — IRON AND TIBC
%SAT: 45 % (ref 20–55)
Iron: 122 ug/dL (ref 42–165)
TIBC: 269 ug/dL (ref 215–435)
UIBC: 147 ug/dL (ref 125–400)

## 2013-08-01 LAB — VITAMIN B12: VITAMIN B 12: 667 pg/mL (ref 211–911)

## 2013-08-01 LAB — HEMOCCULT GUIAC POC 1CARD (OFFICE): Fecal Occult Blood, POC: POSITIVE

## 2013-08-01 LAB — FOLATE: Folate: >20 ng/mL

## 2013-08-01 LAB — FERRITIN: FERRITIN: 288 ng/mL (ref 22–322)

## 2013-08-01 MED ORDER — TRIAMCINOLONE ACETONIDE 0.1 % EX CREA: 1.0000 "application " | TOPICAL_CREAM | Freq: Two times a day (BID) | CUTANEOUS | Status: DC

## 2013-08-01 NOTE — Progress Notes (Signed)
Subjective:    Patient ID: John Weaver, male    DOB: 1915-06-16, 80 y.o.   MRN: [id number]  HPI Was in the emergency department for chest pain April 20. MI was ruled out. He thinks it was more of a shoulder issue than frank chest pain. He's been a bit depressed about losing his eyesight in left eye. He is receiving injections in  eye but eyesight has not returned as well as he had hoped. 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  In November 2013 he was hospitalized with a TIA and was placed on Plavix. He is followed by Dr. Pearlean BrownieSethi. MRI showed no stroke. He had a pacemaker placed by Dr. Graciela HusbandsKlein June 2013 for intermittent complete heart block symptomatic and is followed by Dr. Carolanne Grumblingracy Turner, cardiologist.  In October 2013 he went to the emergency department for near syncopal episode. Was diagnosed with volume depletion and a UTI. He was given normal saline and released.  History of macular degeneration left eye. History of hypothyroidism. History of hypertension and hyperlipidemia. Hospitalized with war injuries during World War II.  He is allergic to Sulfa  He had a 2-D echocardiogram when hospitalized in June 2013 at the time of his pacemaker insertion showing moderate aortic stenosis with severely calcified aortic leaflets. Peak gradient 27, mean gradient 12.  History of colonoscopy with Dr. Matthias HughsBuccini in 2008 or 2009  Has had some falls at his home which are due to losing his balance. He has tried exercising by stretching and also walks in a pool once or twice a week. 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  History of back pain, bilateral hip pain, kidney stones in 1965 and 1996. Left knee replacement 1992. Appendectomy 1943. Lithotripsy for kidney stone 1996.   Social history: He is married. Has 2  children from previous marriage, a son and a daughter. Consumes alcohol. Used to smoke a pipe. Wife is retired Psychologist, educationalart history professor at [employer]. Patient is a native of [place]. He studied at the [employer]  and exhibited at major art shows throughout [place] and in [place]. In 1949, he was invited to [employer] in [place]. 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 He held his position until his retirement. He is considered part of the [employer] movement. He lives with his wife here in a condominium in [place]. They also have a second home in [place], [place] which was built by [employer].  Family history: Father died at age 78 in [id number] of kidney problems. Mother died in [id number] at age 78 of a stroke. One brother age 78 was killed in [id number] in World War II. No other siblings.     Review of Systems  Constitutional: Positive for fatigue.  HENT: Negative.   Eyes:       Macular degeneration left eye  Respiratory: Negative.   Cardiovascular:       History of aortic stenosis  Gastrointestinal: Negative.        Takes laxatives for constipation  Endocrine:       History of hypothyroidism  Musculoskeletal:       Walks with a cane  Skin:       Has noticed itchy rash on back  Psychiatric/Behavioral:  Negative.        Objective:   Physical Exam  Vitals reviewed. Constitutional: He is oriented to person, place, and time. He appears well-developed and well-nourished. No distress.  HENT:  Head: Normocephalic and atraumatic.  Right Ear: External ear normal.  Left Ear: External ear normal.  Mouth/Throat: Oropharynx is clear and moist. No oropharyngeal exudate.  Eyes: Conjunctivae are normal. Right eye exhibits no discharge. Left eye exhibits no discharge. No scleral icterus.  Neck: Neck supple. No JVD present. No thyromegaly present.  Cardiovascular: Normal rate, regular rhythm and intact distal pulses.   Murmur heard. Pulmonary/Chest: Effort  normal and breath sounds normal. No respiratory distress. He has no wheezes. He has no rales. He exhibits no tenderness.  Abdominal: Soft. Bowel sounds are normal. He exhibits no distension and no mass. There is no tenderness. There is no rebound and no guarding.  Genitourinary: Prostate normal.  Prostate normal. Stool is trace guaiac positive. He is on Plavix.  Musculoskeletal: Normal range of motion. He exhibits no edema.  Lymphadenopathy:    He has no cervical adenopathy.  Neurological: He is alert and oriented to person, place, and time. He has normal reflexes. No cranial nerve deficit.  Skin: Skin is warm and dry. He is not diaphoretic. There is erythema.  Superficial sacral decubitus and also superficial decubitus right hip. Fine papular erythema does rash on back       Psychiatric: He has a normal mood and affect. His behavior is normal. Judgment and thought content normal.          Assessment & Plan:   History of pacemaker for intermittent complete heart block  History of TIA  Moderate aortic stenosis  Osteoarthritis of back and hips  History of hypertension-stable  History of hyperlipidemia-lipid panel normal on statin medication  History of kidney stones  Hypothyroidism-need to add TSH  Decubiti sacral area and right hip. Will speak with wife about how this was treated in the past? Occlusive dressing such as DuoDERM  Nonspecific dermatitis on back. Prescribed triamcinolone cream in 4 ounces of Eucerin cream to use on back 2-3 times daily.  B12 and folate levels checked. TSH needs to be added to fasting lab work. Has history of hypothyroidism.  Plan: Return in 6 months for office visit, lipid panel, liver functions ,TSH      Subjective:   Patient presents for Medicare Annual/Subsequent preventive examination.  Review Past Medical/Family/Social: See above   Risk Factors  Current exercise habits: sedentary Dietary issues discussed: Low-fat  low-carb  Cardiac risk factors: HTN, hyperlipidemia,  Depression Screen  (Note: if answer to either of the following is "Yes", a more complete depression screening is indicated)   Over the past two weeks, have you felt down, depressed or hopeless? Several days because of decline in vision affecting his painting  Over the past two weeks, have you felt little interest or pleasure in doing things? As above Have you lost interest or pleasure in daily life? No Do you often feel hopeless? No Do you cry easily over simple problems? No   Activities of Daily Living  In your present state of health, do you have any difficulty performing the following activities?:   Driving? No longer drives  Managing money? No  Feeding yourself? No  Getting from bed to chair? No  Climbing a flight of stairs? No  Preparing food and eating?: No  Bathing or showering? No  Getting dressed: No  Getting to the toilet? No  Using  the toilet:No  Moving around from place to place: No  In the past year have you fallen or had a near fall?:No  Are you sexually active? No  Do you have more than one partner? No   Hearing Difficulties: No  Do you often ask people to speak up or repeat themselves? yes Do you experience ringing or noises in your ears? No  Do you have difficulty understanding soft or whispered voices? yes Do you feel that you have a problem with memory?  sometimes Do you often misplace items? sometimes   Home Safety:  Do you have a smoke alarm at your residence? Yes Do you have grab bars in the bathroom?yes Do you have throw rugs in your house?  yes   Cognitive Testing  Alert? Yes Normal Appearance?Yes  Oriented to person? Yes Place? Yes  Time? Yes  Recall of three objects? Yes  Can perform simple calculations? Yes  Displays appropriate judgment?Yes  Can read the correct time from a watch face?Yes   List the Names of Other Physician/Practitioners you currently use:  See referral list for  the physicians patient is currently seeing.     Review of Systems: See above   Objective:     General appearance: Appears younger than stated age Head: Normocephalic, without obvious abnormality, atraumatic  Eyes: conj clear, EOMi PEERLA  Ears: normal TM's and external ear canals both ears  Nose: Nares normal. Septum midline. Mucosa normal. No drainage or sinus tenderness.  Throat: lips, mucosa, and tongue normal; teeth and gums normal  Neck: no adenopathy, no carotid bruit, no JVD, supple, symmetrical, trachea midline and thyroid not enlarged, symmetric, no tenderness/mass/nodules  No CVA tenderness.  Lungs: clear to auscultation bilaterally  Breasts: normal appearance, no masses or tenderness, top of the pacemaker on left upper chest.  Heart: regular rate and rhythm, S1, S2 normal, no murmur, click, rub or gallop  Abdomen: soft, non-tender; bowel sounds normal; no masses, no organomegaly  Musculoskeletal: ROM normal in all joints, no crepitus, no deformity, Normal muscle strengthen. Back  is symmetric, no curvature. Skin: Skin color, texture, turgor normal. No rashes or lesions  Lymph nodes: Cervical, supraclavicular, and axillary nodes normal.  Neurologic: CN 2 -12 Normal, Normal symmetric reflexes. Normal coordination and gait  Psych: Alert & Oriented x 3, Mood appear stable.    Assessment:    Annual wellness medicare exam   Plan:    During the course of the visit the patient was educated and counseled about appropriate screening and preventive services including:   Annual PSA     Patient Instructions (the written plan) was given to the patient.  Medicare Attestation  I have personally reviewed:  The patient's medical and social history  Their use of alcohol, tobacco or illicit drugs  Their current medications and supplements  The patient's functional ability including ADLs,fall risks, home safety risks, cognitive, and hearing and visual impairment  Diet and  physical activities  Evidence for depression or mood disorders  The patient's weight, height, BMI, and visual acuity have been recorded in the chart. I have made referrals, counseling, and provided education to the patient based on review of the above and I have provided the patient with a written personalized care plan for preventive services.

## 2013-08-04 ENCOUNTER — Encounter: Payer: Self-pay | Admitting: Internal Medicine

## 2013-08-04 NOTE — Patient Instructions (Signed)
Use cream on back for itching 2-3 times daily. Continue same medications and return in 6 months.

## 2013-08-05 LAB — TSH: TSH: 2.766 u[IU]/mL (ref 0.350–4.500)

## 2013-08-07 ENCOUNTER — Telehealth: Payer: Self-pay

## 2013-08-07 NOTE — Telephone Encounter (Signed)
Patient scheduled for an appointment with [facility] on 09/07/2013 at 1:30 pm. Patients wife informed of this date and time

## 2013-08-07 NOTE — Addendum Note (Signed)
Addended by: Judy PimpleEILAND, Josep Luviano M on: 08/07/2013 11:43 AM   Modules accepted: Orders

## 2013-08-27 ENCOUNTER — Ambulatory Visit (INDEPENDENT_AMBULATORY_CARE_PROVIDER_SITE_OTHER): Payer: Medicare Other | Admitting: Internal Medicine

## 2013-08-27 ENCOUNTER — Encounter: Payer: Self-pay | Admitting: Internal Medicine

## 2013-08-27 VITALS — BP 132/74 | HR 68 | Temp 97.6°F | Wt 127.0 lb

## 2013-08-27 DIAGNOSIS — M79609 Pain in unspecified limb: Secondary | ICD-10-CM

## 2013-08-27 DIAGNOSIS — M79651 Pain in right thigh: Secondary | ICD-10-CM

## 2013-08-27 DIAGNOSIS — K219 Gastro-esophageal reflux disease without esophagitis: Secondary | ICD-10-CM

## 2013-08-27 MED ORDER — ESOMEPRAZOLE MAGNESIUM 40 MG PO CPDR: 40.0000 mg | DELAYED_RELEASE_CAPSULE | Freq: Every day | ORAL | Status: DC

## 2013-08-27 MED ORDER — OXYCODONE-ACETAMINOPHEN 5-325 MG PO TABS: 1.0000 | ORAL_TABLET | Freq: Two times a day (BID) | ORAL | Status: DC | PRN

## 2013-08-27 NOTE — Patient Instructions (Signed)
Take Nexium for GE reflux. Take Oxycodone APAP for pain sparingly

## 2013-08-27 NOTE — Progress Notes (Signed)
   Subjective:    Patient ID: John Weaver, male    DOB: 1916/01/20, 80 y.o.   MRN: [id number]  HPI  He has pain right quadriceps muscle. He has had a left knee replacement but not her right knee replacement. He thinks the right quadriceps pain is due to his bad right knee. He walks with a cane. The muscle is nontender. However, his wife gave him one of her oxycodone APAP tablets and he had pain relief. He would only take one a day at most. Sometimes when he has a larger meal at night and has several cocktails he's experiencing some nausea and sometimes has an episode of vomiting. He has no pain. He does not smoke cigarettes or cigars. He will have several cocktails or wine with a meal.    Review of Systems     Objective:   Physical Exam no palpable abnormality of right quadriceps muscle. No masses on abdominal exam        Assessment & Plan:  Musculoskeletal pain right leg  GE reflux  Plan: Oxycodone APAP #61 by mouth every 12 hours when necessary pain. Generic Nexium 40 mg daily. If nausea continues he will need to see gastroenterologist

## 2013-09-04 ENCOUNTER — Encounter (HOSPITAL_BASED_OUTPATIENT_CLINIC_OR_DEPARTMENT_OTHER): Payer: Medicare Other | Attending: General Surgery

## 2013-09-04 DIAGNOSIS — L8991 Pressure ulcer of unspecified site, stage 1: Secondary | ICD-10-CM | POA: Insufficient documentation

## 2013-09-04 DIAGNOSIS — I459 Conduction disorder, unspecified: Secondary | ICD-10-CM | POA: Insufficient documentation

## 2013-09-04 DIAGNOSIS — L89209 Pressure ulcer of unspecified hip, unspecified stage: Secondary | ICD-10-CM | POA: Insufficient documentation

## 2013-09-04 DIAGNOSIS — Z8673 Personal history of transient ischemic attack (TIA), and cerebral infarction without residual deficits: Secondary | ICD-10-CM | POA: Insufficient documentation

## 2013-09-04 DIAGNOSIS — Z79899 Other long term (current) drug therapy: Secondary | ICD-10-CM | POA: Insufficient documentation

## 2013-09-04 DIAGNOSIS — I10 Essential (primary) hypertension: Secondary | ICD-10-CM | POA: Insufficient documentation

## 2013-09-04 DIAGNOSIS — Z7902 Long term (current) use of antithrombotics/antiplatelets: Secondary | ICD-10-CM | POA: Insufficient documentation

## 2013-09-05 NOTE — Progress Notes (Signed)
[facility]  NAME:  John Weaver, John Weaver              ACCOUNT NO.:  [id number]  MEDICAL RECORD NO.:  [id number]      DATE OF BIRTH:  [birth year]  PHYSICIAN:  Ardath Sax, M.D.           VISIT DATE:                                  OFFICE VISIT   HISTORY:  The patient is a 80 year old gentleman who comes to [place] with a pressure ulcer on his right hip.  I would rank it as a stage I or maybe early II.  It is less than a 1 cm in diameter and does not appear infected at this point.  We are going to treat it with collagen and an duoderm_________ pad to offload it.  This man is 80 years of age, has some macular degeneration, but appears much younger even though he has a pacemaker for complete heart block and the fact that he has had a TIA in the past.  He also is hypothyroid and has aortic stenosis.  His medicines include Plavix, Synthroid, lisinopril, Flomax, and a statin drug.  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  He is from [place] and fought in the Second World War.  His brother actually died in the war from a war wound.  We will treat this with offloading and collagen, and I will have him come back in a week and check him out again.  DIAGNOSES:  Pressure ulcer right hip and history of heart block, history of transient ischemic attack, and history of hypertension.     Ardath Sax, M.D.     PP/MEDQ  D:  09/04/2013  T:  09/05/2013  Job:  [id number]

## 2013-09-10 ENCOUNTER — Ambulatory Visit (INDEPENDENT_AMBULATORY_CARE_PROVIDER_SITE_OTHER): Payer: Medicare Other | Admitting: Internal Medicine

## 2013-09-10 ENCOUNTER — Encounter: Payer: Self-pay | Admitting: Internal Medicine

## 2013-09-10 VITALS — BP 132/64 | HR 84 | Temp 98.7°F

## 2013-09-10 DIAGNOSIS — L0291 Cutaneous abscess, unspecified: Secondary | ICD-10-CM

## 2013-09-10 DIAGNOSIS — L0390 Cellulitis, unspecified: Secondary | ICD-10-CM

## 2013-09-10 MED ORDER — MUPIROCIN 2 % EX OINT: 1.0000 "application " | TOPICAL_OINTMENT | Freq: Two times a day (BID) | CUTANEOUS | Status: DC

## 2013-09-10 MED ORDER — CEPHALEXIN 500 MG PO CAPS: 500.0000 mg | ORAL_CAPSULE | Freq: Four times a day (QID) | ORAL | Status: DC

## 2013-09-10 MED ORDER — CEFTRIAXONE SODIUM 1 G IJ SOLR
1.0000 g | Freq: Once | INTRAMUSCULAR | Status: AC
  Administered 2013-09-10: 1 g via INTRAMUSCULAR

## 2013-09-10 NOTE — Progress Notes (Signed)
   Subjective:    Patient ID: John Weaver, male    DOB: 1915/12/01, 80 y.o.   MRN: [id number]  HPI This past weekend was at his home in [place], [place] working on some sculpture outdoors. Developed some red macular areas on his right lower extremity with swelling. No fever or shaking chills. Doesn't recall any insect bites. He is going to the wound care center for some superficial decubiti at the present time.    Review of Systems     Objective:   Physical Exam He has 1+ pitting edema of the right lower extremity. Homans sign is negative. He has several small macular areas that are red that could be insect bites or very superficial abrasions.        Assessment & Plan:  Cellulitis right lower extremity  Plan: Keep right leg elevated. Rocephin 1 g IM given today in the office. Keflex 500 mg 4 times daily for 10 days. Use Bactroban ointment on lesions on right lower leg twice daily. Return in one week for followup or sooner if worse

## 2013-09-10 NOTE — Patient Instructions (Signed)
Rocephin 1 g IM given today. Take Keflex 500 mg 4 times daily for 10 days. Use Bactroban on lesions of right leg twice daily. Return in one week. Keep right leg elevated.

## 2013-09-12 ENCOUNTER — Encounter: Payer: Medicare Other | Admitting: *Deleted

## 2013-09-12 ENCOUNTER — Telehealth: Payer: Self-pay | Admitting: Cardiology

## 2013-09-12 NOTE — Telephone Encounter (Signed)
Attempted to call pt x1 to remind pt of remote transmission that is due today.  

## 2013-09-13 ENCOUNTER — Encounter: Payer: Self-pay | Admitting: Cardiology

## 2013-09-16 ENCOUNTER — Encounter (HOSPITAL_COMMUNITY): Payer: Self-pay | Admitting: Emergency Medicine

## 2013-09-16 ENCOUNTER — Inpatient Hospital Stay (HOSPITAL_COMMUNITY)
Admission: EM | Admit: 2013-09-16 | Discharge: 2013-09-18 | DRG: 603 | Disposition: A | Discharge status: Home / Self Care | Payer: Medicare Other | Attending: Internal Medicine | Admitting: Internal Medicine

## 2013-09-16 ENCOUNTER — Telehealth: Payer: Self-pay

## 2013-09-16 DIAGNOSIS — I10 Essential (primary) hypertension: Secondary | ICD-10-CM | POA: Diagnosis present

## 2013-09-16 DIAGNOSIS — Z96659 Presence of unspecified artificial knee joint: Secondary | ICD-10-CM

## 2013-09-16 DIAGNOSIS — E7800 Pure hypercholesterolemia, unspecified: Secondary | ICD-10-CM | POA: Diagnosis present

## 2013-09-16 DIAGNOSIS — Z950 Presence of cardiac pacemaker: Secondary | ICD-10-CM | POA: Diagnosis present

## 2013-09-16 DIAGNOSIS — Z882 Allergy status to sulfonamides status: Secondary | ICD-10-CM

## 2013-09-16 DIAGNOSIS — N183 Chronic kidney disease, stage 3 unspecified: Secondary | ICD-10-CM | POA: Diagnosis present

## 2013-09-16 DIAGNOSIS — N390 Urinary tract infection, site not specified: Secondary | ICD-10-CM

## 2013-09-16 DIAGNOSIS — IMO0002 Reserved for concepts with insufficient information to code with codable children: Secondary | ICD-10-CM | POA: Diagnosis present

## 2013-09-16 DIAGNOSIS — I350 Nonrheumatic aortic (valve) stenosis: Secondary | ICD-10-CM

## 2013-09-16 DIAGNOSIS — I509 Heart failure, unspecified: Secondary | ICD-10-CM | POA: Diagnosis present

## 2013-09-16 DIAGNOSIS — L03119 Cellulitis of unspecified part of limb: Principal | ICD-10-CM

## 2013-09-16 DIAGNOSIS — I495 Sick sinus syndrome: Secondary | ICD-10-CM | POA: Diagnosis present

## 2013-09-16 DIAGNOSIS — L0291 Cutaneous abscess, unspecified: Secondary | ICD-10-CM

## 2013-09-16 DIAGNOSIS — N189 Chronic kidney disease, unspecified: Secondary | ICD-10-CM | POA: Diagnosis present

## 2013-09-16 DIAGNOSIS — E039 Hypothyroidism, unspecified: Secondary | ICD-10-CM | POA: Diagnosis present

## 2013-09-16 DIAGNOSIS — Z87891 Personal history of nicotine dependence: Secondary | ICD-10-CM

## 2013-09-16 DIAGNOSIS — E785 Hyperlipidemia, unspecified: Secondary | ICD-10-CM

## 2013-09-16 DIAGNOSIS — E875 Hyperkalemia: Secondary | ICD-10-CM | POA: Diagnosis present

## 2013-09-16 DIAGNOSIS — I129 Hypertensive chronic kidney disease with stage 1 through stage 4 chronic kidney disease, or unspecified chronic kidney disease: Secondary | ICD-10-CM | POA: Diagnosis present

## 2013-09-16 DIAGNOSIS — Z7902 Long term (current) use of antithrombotics/antiplatelets: Secondary | ICD-10-CM

## 2013-09-16 DIAGNOSIS — L0390 Cellulitis, unspecified: Secondary | ICD-10-CM | POA: Diagnosis present

## 2013-09-16 DIAGNOSIS — Z66 Do not resuscitate: Secondary | ICD-10-CM | POA: Diagnosis present

## 2013-09-16 DIAGNOSIS — N400 Enlarged prostate without lower urinary tract symptoms: Secondary | ICD-10-CM | POA: Diagnosis present

## 2013-09-16 DIAGNOSIS — L02419 Cutaneous abscess of limb, unspecified: Principal | ICD-10-CM | POA: Diagnosis present

## 2013-09-16 DIAGNOSIS — Z8673 Personal history of transient ischemic attack (TIA), and cerebral infarction without residual deficits: Secondary | ICD-10-CM

## 2013-09-16 HISTORY — DX: Cellulitis of unspecified part of limb: L03.119

## 2013-09-16 HISTORY — DX: Gastro-esophageal reflux disease without esophagitis: K21.9

## 2013-09-16 HISTORY — DX: Heart failure, unspecified: I50.9

## 2013-09-16 HISTORY — DX: Cardiac arrhythmia, unspecified: I49.9

## 2013-09-16 HISTORY — DX: Cutaneous abscess of limb, unspecified: L02.419

## 2013-09-16 LAB — COMPREHENSIVE METABOLIC PANEL
ALBUMIN: 3.8 g/dL (ref 3.5–5.2)
ALT: 20 U/L (ref 0–53)
AST: 39 U/L — ABNORMAL HIGH (ref 0–37)
Alkaline Phosphatase: 104 U/L (ref 39–117)
BUN: 37 mg/dL — ABNORMAL HIGH (ref 6–23)
CO2: 24 mEq/L (ref 19–32)
Calcium: 9.9 mg/dL (ref 8.4–10.5)
Chloride: 104 mEq/L (ref 96–112)
Creatinine, Ser: 1.31 mg/dL (ref 0.50–1.35)
GFR calc Af Amer: 51 mL/min — ABNORMAL LOW (ref >90)
GFR calc non Af Amer: 44 mL/min — ABNORMAL LOW (ref >90)
Glucose, Bld: 98 mg/dL (ref 70–99)
Potassium: 5.4 mEq/L — ABNORMAL HIGH (ref 3.7–5.3)
Sodium: 138 mEq/L (ref 137–147)
TOTAL PROTEIN: 7 g/dL (ref 6.0–8.3)
Total Bilirubin: 0.4 mg/dL (ref 0.3–1.2)

## 2013-09-16 LAB — URINALYSIS, ROUTINE W REFLEX MICROSCOPIC
Bilirubin Urine: NEGATIVE
GLUCOSE, UA: NEGATIVE mg/dL
Hgb urine dipstick: NEGATIVE
Ketones, ur: NEGATIVE mg/dL
Nitrite: NEGATIVE
PH: 5.5 (ref 5.0–8.0)
Protein, ur: NEGATIVE mg/dL
SPECIFIC GRAVITY, URINE: 1.014 (ref 1.005–1.030)
Urobilinogen, UA: 0.2 mg/dL (ref 0.0–1.0)

## 2013-09-16 LAB — CBC WITH DIFFERENTIAL/PLATELET
BASOS PCT: 0 % (ref 0–1)
Basophils Absolute: 0 10*3/uL (ref 0.0–0.1)
EOS ABS: 0.1 10*3/uL (ref 0.0–0.7)
EOS PCT: 1 % (ref 0–5)
HCT: 34.3 % — ABNORMAL LOW (ref 39.0–52.0)
HEMOGLOBIN: 11.5 g/dL — AB (ref 13.0–17.0)
Lymphocytes Relative: 20 % (ref 12–46)
Lymphs Abs: 2 10*3/uL (ref 0.7–4.0)
MCH: 33.8 pg (ref 26.0–34.0)
MCHC: 33.5 g/dL (ref 30.0–36.0)
MCV: 100.9 fL — AB (ref 78.0–100.0)
MONOS PCT: 7 % (ref 3–12)
Monocytes Absolute: 0.7 10*3/uL (ref 0.1–1.0)
NEUTROS PCT: 72 % (ref 43–77)
Neutro Abs: 7.2 10*3/uL (ref 1.7–7.7)
Platelets: 214 10*3/uL (ref 150–400)
RBC: 3.4 MIL/uL — AB (ref 4.22–5.81)
RDW: 13 % (ref 11.5–15.5)
WBC: 10 10*3/uL (ref 4.0–10.5)

## 2013-09-16 LAB — TROPONIN I

## 2013-09-16 LAB — URINE MICROSCOPIC-ADD ON

## 2013-09-16 LAB — PRO B NATRIURETIC PEPTIDE: Pro B Natriuretic peptide (BNP): 1016 pg/mL — ABNORMAL HIGH (ref 0–450)

## 2013-09-16 MED ORDER — SIMVASTATIN 20 MG PO TABS
20.0000 mg | ORAL_TABLET | Freq: Every day | ORAL | Status: DC
  Administered 2013-09-16 – 2013-09-17 (×2): 20 mg via ORAL
  Filled 2013-09-16 (×3): qty 1

## 2013-09-16 MED ORDER — HYDRALAZINE HCL 20 MG/ML IJ SOLN: 10.0000 mg | INTRAMUSCULAR | Status: DC | PRN

## 2013-09-16 MED ORDER — SENNA 8.6 MG PO TABS
1.0000 | ORAL_TABLET | Freq: Every evening | ORAL | Status: DC | PRN
  Filled 2013-09-16: qty 1

## 2013-09-16 MED ORDER — SODIUM CHLORIDE 0.9 % IV SOLN
Freq: Once | INTRAVENOUS | Status: AC
  Administered 2013-09-16: 14:00:00 via INTRAVENOUS

## 2013-09-16 MED ORDER — ENOXAPARIN SODIUM 30 MG/0.3ML ~~LOC~~ SOLN
30.0000 mg | SUBCUTANEOUS | Status: DC
  Administered 2013-09-16 – 2013-09-17 (×2): 30 mg via SUBCUTANEOUS
  Filled 2013-09-16 (×3): qty 0.3

## 2013-09-16 MED ORDER — OXYCODONE-ACETAMINOPHEN 5-325 MG PO TABS: 1.0000 | ORAL_TABLET | Freq: Two times a day (BID) | ORAL | Status: DC | PRN

## 2013-09-16 MED ORDER — SODIUM CHLORIDE 0.9 % IV SOLN
INTRAVENOUS | Status: AC
  Administered 2013-09-16: 17:00:00 via INTRAVENOUS

## 2013-09-16 MED ORDER — DIPHENHYDRAMINE-APAP (SLEEP) 25-500 MG PO TABS: 1.0000 | ORAL_TABLET | Freq: Every evening | ORAL | Status: DC | PRN

## 2013-09-16 MED ORDER — LISINOPRIL 20 MG PO TABS
20.0000 mg | ORAL_TABLET | Freq: Every day | ORAL | Status: DC
  Administered 2013-09-17 – 2013-09-18 (×2): 20 mg via ORAL
  Filled 2013-09-16 (×2): qty 1

## 2013-09-16 MED ORDER — LEVOTHYROXINE SODIUM 100 MCG PO TABS
100.0000 ug | ORAL_TABLET | Freq: Every day | ORAL | Status: DC
  Administered 2013-09-17 – 2013-09-18 (×2): 100 ug via ORAL
  Filled 2013-09-16 (×3): qty 1

## 2013-09-16 MED ORDER — ZOLPIDEM TARTRATE 5 MG PO TABS
5.0000 mg | ORAL_TABLET | Freq: Every evening | ORAL | Status: DC | PRN
Start: 2013-09-16 — End: 2013-09-18

## 2013-09-16 MED ORDER — CLOPIDOGREL BISULFATE 75 MG PO TABS
75.0000 mg | ORAL_TABLET | Freq: Every day | ORAL | Status: DC
  Administered 2013-09-17 – 2013-09-18 (×2): 75 mg via ORAL
  Filled 2013-09-16 (×3): qty 1

## 2013-09-16 MED ORDER — ACETAMINOPHEN 325 MG PO TABS: 650.0000 mg | ORAL_TABLET | Freq: Four times a day (QID) | ORAL | Status: DC | PRN

## 2013-09-16 MED ORDER — DEXTROSE 5 % IV SOLN
1.0000 g | INTRAVENOUS | Status: DC
  Administered 2013-09-16: 1 g via INTRAVENOUS
  Filled 2013-09-16 (×2): qty 10

## 2013-09-16 MED ORDER — ADULT MULTIVITAMIN W/MINERALS CH
1.0000 | ORAL_TABLET | Freq: Every day | ORAL | Status: DC
  Administered 2013-09-17 – 2013-09-18 (×2): 1 via ORAL
  Filled 2013-09-16 (×2): qty 1

## 2013-09-16 MED ORDER — SENNOSIDES 15 MG PO TABS: 15.0000 mg | ORAL_TABLET | Freq: Every evening | ORAL | Status: DC | PRN

## 2013-09-16 MED ORDER — ACETAMINOPHEN 650 MG RE SUPP: 650.0000 mg | Freq: Four times a day (QID) | RECTAL | Status: DC | PRN

## 2013-09-16 MED ORDER — TAMSULOSIN HCL 0.4 MG PO CAPS
0.4000 mg | ORAL_CAPSULE | Freq: Every day | ORAL | Status: DC
  Administered 2013-09-16 – 2013-09-17 (×2): 0.4 mg via ORAL
  Filled 2013-09-16 (×3): qty 1

## 2013-09-16 MED ORDER — VITAMIN C 500 MG PO TABS
1000.0000 mg | ORAL_TABLET | Freq: Every day | ORAL | Status: DC
  Administered 2013-09-17 – 2013-09-18 (×2): 1000 mg via ORAL
  Filled 2013-09-16 (×2): qty 2

## 2013-09-16 MED ORDER — VANCOMYCIN HCL IN DEXTROSE 1-5 GM/200ML-% IV SOLN
1000.0000 mg | INTRAVENOUS | Status: DC
  Filled 2013-09-16: qty 200

## 2013-09-16 MED ORDER — THERA M PLUS PO TABS: 1.0000 | ORAL_TABLET | Freq: Every day | ORAL | Status: DC

## 2013-09-16 MED ORDER — LABETALOL HCL 5 MG/ML IV SOLN
10.0000 mg | INTRAVENOUS | Status: DC | PRN
  Filled 2013-09-16: qty 4

## 2013-09-16 MED ORDER — VANCOMYCIN HCL IN DEXTROSE 1-5 GM/200ML-% IV SOLN
1000.0000 mg | Freq: Once | INTRAVENOUS | Status: AC
  Administered 2013-09-16: 1000 mg via INTRAVENOUS
  Filled 2013-09-16: qty 200

## 2013-09-16 MED ORDER — PANTOPRAZOLE SODIUM 40 MG PO TBEC
40.0000 mg | DELAYED_RELEASE_TABLET | Freq: Every day | ORAL | Status: DC
  Administered 2013-09-17 – 2013-09-18 (×2): 40 mg via ORAL
  Filled 2013-09-16 (×2): qty 1

## 2013-09-16 NOTE — ED Notes (Signed)
MD at bedside. 

## 2013-09-16 NOTE — Telephone Encounter (Signed)
Agree he needs to go to hospital.

## 2013-09-16 NOTE — Telephone Encounter (Signed)
Patient's wife calls just now to report that John Weaver is confused, delusional and incontinent of urine. Also, right leg is more swollen than last week. She is calling from [place]. Advised taking him to an ED up there, but she will not hear of it. [place] [facility] and [facility].

## 2013-09-16 NOTE — ED Notes (Signed)
Pt here for cellulitis of right lower leg and foot, incontinence and altered mental status, cellulitis x 1 week and other symptoms worse yesterday

## 2013-09-16 NOTE — Progress Notes (Signed)
ANTIBIOTIC CONSULT NOTE - INITIAL  Pharmacy Consult:  Vancomycin Indication:  Cellulitis  Allergies  Allergen Reactions  . Sulfa Antibiotics Rash    Patient Measurements: Height: 5\' 4"  (162.6 cm) Weight: 126 lb (57.153 kg) IBW/kg (Calculated) : 59.2  Vital Signs: Temp: 98.2 F (36.8 C) (06/15 1055) Temp src: Oral (06/15 1055) BP: 137/58 mmHg (06/15 1253) Pulse Rate: 65 (06/15 1253)  Labs: No results found for this basename: WBC, HGB, PLT, LABCREA, CREATININE,  in the last 72 hours Estimated Creatinine Clearance: 30.4 ml/min (by C-G formula based on Cr of 1.15). No results found for this basename: VANCOTROUGH, VANCOPEAK, VANCORANDOM, GENTTROUGH, GENTPEAK, GENTRANDOM, TOBRATROUGH, TOBRAPEAK, TOBRARND, AMIKACINPEAK, AMIKACINTROU, AMIKACIN,  in the last 72 hours   Microbiology: No results found for this or any previous visit (from the past 720 hour(s)).  Medical History: Past Medical History  Diagnosis Date  . Back pain   . Hypertension   . Hypercholesteremia   . Hypothyroidism   . Stroke 02/2012    tia  . History of CHF (congestive heart failure)   . Hemorrhoids   . DDD (degenerative disc disease)     of the spine  . Kidney stones   . Nephritis     at age 80  . Bradycardia     with snycope  . SSS (sick sinus syndrome)     in setting of syncope s/p PPM     Assessment: 80 YOM with worsening RL leg and foot cellulitis to start vancomycin.  Baseline labs reviewed.   Goal of Therapy:  Vancomycin trough level 10-15 mcg/ml   Plan:  - Vanc 1g IV x 1, then 1gm IV Q48H - Monitor renal fxn, clinical course, vanc trough as indicated    Jewel Venditto D. Laney Potashang, PharmD, BCPS Pager:  [phone] 09/16/2013, 2:27 PM

## 2013-09-16 NOTE — Progress Notes (Addendum)
Heart rate increased to the 140s non-sustaining. Patient asymptomatic at the time. MD made aware. Will continue to monitor patient for further changes in condition.

## 2013-09-16 NOTE — ED Provider Notes (Signed)
CSN: [id number]     Arrival date & time 09/16/13  1024 History   First MD Initiated Contact with Patient 09/16/13 1240     Chief Complaint  Patient presents with  . Cellulitis  . Altered Mental Status     HPI  Patient presents with concern ongoing erythema, pain in the right distal lower extremity. Symptoms began approximately one week ago, with small area of erythema in the right distal calf. In the interval the patient has been taking Keflex. In the interval patient has had increasing erythema throughout the right distal lower extremity, as well as no occasional episodes of confusion. At baseline patient has no dementia, appropriate interactivity. Patient states that he feels like " a character in "[place]ing for Godot."  He denies pain anywhere other than the right lower extremity. History of present illness is according to the patient and his wife.   Past Medical History  Diagnosis Date  . Back pain   . Hypertension   . Hypercholesteremia   . Hypothyroidism   . Stroke 02/2012    tia  . History of CHF (congestive heart failure)   . Hemorrhoids   . DDD (degenerative disc disease)     of the spine  . Kidney stones   . Nephritis     at age 80  . Bradycardia     with snycope  . SSS (sick sinus syndrome)     in setting of syncope s/p PPM   Past Surgical History  Procedure Laterality Date  . Knee surgery      left knee  . Joint replacement      Left knee replacement  . Insert / replace / remove pacemaker     Family History  Problem Relation Age of Onset  . Kidney disease Mother   . Kidney disease Father    History  Substance Use Topics  . Smoking status: Former Smoker    Types: Pipe  . Smokeless tobacco: Never Used  . Alcohol Use: 0.6 oz/week    1 Glasses of wine per week    Review of Systems  Constitutional:       Per HPI, otherwise negative  HENT:       Per HPI, otherwise negative  Respiratory:       Per HPI, otherwise negative  Cardiovascular:        Per HPI, otherwise negative  Gastrointestinal: Negative for vomiting.  Endocrine:       Negative aside from HPI  Genitourinary:       Neg aside from HPI   Musculoskeletal:       Per HPI, otherwise negative  Skin: Positive for color change and wound.  Neurological: Negative for syncope.      Allergies  Sulfa antibiotics  Home Medications   Prior to Admission medications   Medication Sig Start Date End Date Taking? Authorizing Provider  Ascorbic Acid (VITAMIN C) 1000 MG tablet Take 1,000 mg by mouth daily.   Yes Historical Provider, MD  cephALEXin (KEFLEX) 500 MG capsule Take 1 capsule (500 mg total) by mouth 4 (four) times daily. 09/10/13 09/20/13 Yes Margaree MackintoshMary J [name], MD  clopidogrel (PLAVIX) 75 MG tablet Take 75 mg by mouth daily with breakfast.   Yes Historical Provider, MD  Diphenhydramine-APAP, sleep, (TYLENOL PM EXTRA STRENGTH PO) Take 1-2 tablets by mouth at bedtime as needed. For sleep   Yes Historical Provider, MD  esomeprazole (NEXIUM) 40 MG capsule Take 1 capsule (40 mg total) by mouth daily. 08/27/13  Yes [name] [name], MD  levothyroxine (SYNTHROID, LEVOTHROID) 100 MCG tablet Take 1 tablet (1Margaree Mackintosh00 mcg total) by mouth daily. 07/17/12  Yes Margaree MackintoshMary J [name], MD  lisinopril (PRINIVIL,ZESTRIL) 10 MG tablet Take 20 mg by mouth daily. 07/17/12  Yes Margaree MackintoshMary J [name], MD  Multiple Vitamins-Minerals (MULTIVITAMINS THER. W/MINERALS) TABS Take 1 tablet by mouth daily.   Yes Historical Provider, MD  oxyCODONE-acetaminophen (PERCOCET/ROXICET) 5-325 MG per tablet Take 1 tablet by mouth 2 (two) times daily as needed for severe pain. 08/27/13  Yes Margaree MackintoshMary J [name], MD  pravastatin (PRAVACHOL) 40 MG tablet Take 1 tablet (40 mg total) by mouth at bedtime. 07/17/12  Yes Margaree MackintoshMary J [name], MD  Sennosides (EX-LAX) 15 MG TABS Take 15 mg by mouth at bedtime as needed. For constipation   Yes Historical Provider, MD  tamsulosin (FLOMAX) 0.4 MG CAPS capsule Take 0.4 mg by mouth daily after supper.  05/14/13  Yes Historical  Provider, MD   BP 137/58  Pulse 65  Temp(Src) 98.2 F (36.8 C) (Oral)  Resp 17  Ht 5\' 4"  (1.626 m)  Wt 126 lb (57.153 kg)  BMI 21.62 kg/m2  SpO2 100% Physical Exam  Nursing note and vitals reviewed. Constitutional: He is oriented to person, place, and time. He appears well-developed. No distress.  HENT:  Head: Normocephalic and atraumatic.  Eyes: Conjunctivae and EOM are normal.  Cardiovascular: Normal rate and regular rhythm.   Pulmonary/Chest: Effort normal. No stridor. No respiratory distress.  Abdominal: He exhibits no distension.  Musculoskeletal: He exhibits no edema.  Neurological: He is alert and oriented to person, place, and time. No cranial nerve deficit. Coordination normal.  Skin: Skin is warm and dry.  The right cath in its entirety is erythematous, mild indurated, with no focal discharge or drainage.  A  Psychiatric: He has a normal mood and affect. His behavior is normal. Thought content normal.    ED Course  Procedures (including critical care time) Labs Review Labs Reviewed  CBC WITH DIFFERENTIAL - Abnormal; Notable for the following:    RBC 3.40 (*)    Hemoglobin 11.5 (*)    HCT 34.3 (*)    MCV 100.9 (*)    All other components within normal limits  COMPREHENSIVE METABOLIC PANEL - Abnormal; Notable for the following:    Potassium 5.4 (*)    BUN 37 (*)    AST 39 (*)    GFR calc non Af Amer 44 (*)    GFR calc Af Amer 51 (*)    All other components within normal limits  URINALYSIS, ROUTINE W REFLEX MICROSCOPIC - Abnormal; Notable for the following:    Leukocytes, UA MODERATE (*)    All other components within normal limits  PRO B NATRIURETIC PEPTIDE - Abnormal; Notable for the following:    Pro B Natriuretic peptide (BNP) 1016.0 (*)    All other components within normal limits  URINE MICROSCOPIC-ADD ON - Abnormal; Notable for the following:    Bacteria, UA FEW (*)    All other components within normal limits      EKG  Interpretation   Date/Time:  Monday September 16 2013 15:11:05 EDT Ventricular Rate:  81 PR Interval:  288 QRS Duration: 131 QT Interval:  400 QTC Calculation: 464 R Axis:   -74 Text Interpretation:  Sinus or ectopic atrial rhythm Prolonged PR interval  RBBB and LAFB ST elevation, consider inferior injury with 1st degree A-V  block Right bundle branch block Left anterior fasicular block No  significant  change since last tracing Abnormal ekg Confirmed by Gerhard Munch  MD [phone]) on 09/16/2013 3:51:24 PM     After the initial evaluation I reviewed the patient's chart MDM   Final diagnoses:  Cellulitis    Patient presents with concern for cellulitis which did not improve with one week of outpatient antibiotics.  The patient is awake and alert, interacting appropriately, but with some description of possible delirium by his wife, there is some concern for bacteremia. Patient is afebrile, hemodynamically stable, and there is low suspicion for sepsis. Given the persistent edema patient will have ultrasound as well.  This was pending on admission. Patient had hyperkalemia, but no EKG evidence of significant effect. Review of patient's chart demonstrates ejection fraction of 60-65% an echocardiogram within the past 6 months.  The patient received IV fluid resuscitation here for his cellulitis.     Gerhard Munch, MD 09/16/13 1620

## 2013-09-16 NOTE — H&P (Addendum)
[facility] History and Physical  John Weaver ZOX:[id number] DOB: June 07, 1915 DOA: 09/16/2013  Referring physician: EDP PCP: Margaree MackintoshBAXLEY,[name], MD   Chief Complaint: Worsening right leg swelling and redness  HPI: John Weaver is a 80 y.o. male with history of hypertension, diastolic CHF, CKD., hypothyroidism who presents with above complaints. He states that he developed redness and swelling in his right leg last week-saw his PCP and was started on Keflex (6/9). Initially he seemed to be improving but per wife the redness and swelling got worse yesterday and she called PCP this a.m. and was asked to come to the ED. He denies fevers, shortness of breath, nausea or vomiting diarrhea and chest pain. His wife states that he has had some occasional confusion. In the ED he was started on empiric Vanc for cellulitis and Doppler ultrasound ordered. Urinalysis is consistent with a UTI. He is admitted for further evaluation and management.    Review of Systems The patient denies anorexia, fever, weight loss,, vision loss, decreased hearing, hoarseness, chest pain, syncope, dyspnea on exertion, peripheral edema, balance deficits, hemoptysis, abdominal pain, melena, hematochezia, severe indigestion/heartburn, hematuria, incontinence, genital sores, muscle weakness, suspicious skin lesions, transient blindness, difficulty walking, depression, unusual weight change, abnormal bleeding.   Past Medical History  Diagnosis Date  . Back pain   . Hypertension   . Hypercholesteremia   . Hypothyroidism   . Stroke 02/2012    tia  . History of CHF (congestive heart failure)   . Hemorrhoids   . DDD (degenerative disc disease)     of the spine  . Kidney stones   . Nephritis     at age 80  . Bradycardia     with snycope  . SSS (sick sinus syndrome)     in setting of syncope s/p PPM   Past Surgical History  Procedure Laterality Date  . Knee surgery      left knee  . Joint replacement      Left knee  replacement  . Insert / replace / remove pacemaker     Social History:  reports that he has quit smoking. His smoking use included Pipe. He has never used smokeless tobacco. He reports that he drinks about .6 ounces of alcohol per week. He reports that he does not use illicit drugs.  Allergies  Allergen Reactions  . Sulfa Antibiotics Rash    Family History  Problem Relation Age of Onset  . Kidney disease Mother   . Kidney disease Father      Prior to Admission medications   Medication Sig Start Date End Date Taking? Authorizing Provider  Ascorbic Acid (VITAMIN C) 1000 MG tablet Take 1,000 mg by mouth daily.   Yes Historical Provider, MD  cephALEXin (KEFLEX) 500 MG capsule Take 1 capsule (500 mg total) by mouth 4 (four) times daily. 09/10/13 09/20/13 Yes Margaree MackintoshMary J [name], MD  clopidogrel (PLAVIX) 75 MG tablet Take 75 mg by mouth daily with breakfast.   Yes Historical Provider, MD  Diphenhydramine-APAP, sleep, (TYLENOL PM EXTRA STRENGTH PO) Take 1-2 tablets by mouth at bedtime as needed. For sleep   Yes Historical Provider, MD  esomeprazole (NEXIUM) 40 MG capsule Take 1 capsule (40 mg total) by mouth daily. 08/27/13  Yes Margaree MackintoshMary J [name], MD  levothyroxine (SYNTHROID, LEVOTHROID) 100 MCG tablet Take 1 tablet (100 mcg total) by mouth daily. 07/17/12  Yes Margaree MackintoshMary J [name], MD  lisinopril (PRINIVIL,ZESTRIL) 10 MG tablet Take 20 mg by mouth daily. 07/17/12  Yes Luanna ColeMary J  [name], MD  Multiple Vitamins-Minerals (MULTIVITAMINS THER. W/MINERALS) TABS Take 1 tablet by mouth daily.   Yes Historical Provider, MD  oxyCODONE-acetaminophen (PERCOCET/ROXICET) 5-325 MG per tablet Take 1 tablet by mouth 2 (two) times daily as needed for severe pain. 08/27/13  Yes Margaree MackintoshMary J [name], MD  pravastatin (PRAVACHOL) 40 MG tablet Take 1 tablet (40 mg total) by mouth at bedtime. 07/17/12  Yes Margaree MackintoshMary J [name], MD  Sennosides (EX-LAX) 15 MG TABS Take 15 mg by mouth at bedtime as needed. For constipation   Yes Historical Provider, MD   tamsulosin (FLOMAX) 0.4 MG CAPS capsule Take 0.4 mg by mouth daily after supper.  05/14/13  Yes Historical Provider, MD   Physical Exam: Filed Vitals:   09/16/13 1704  BP: 174/77  Pulse:   Temp:   Resp:     BP 174/77  Pulse 86  Temp(Src) 97.7 F (36.5 C) (Oral)  Resp 16  Ht 5\' 4"  (1.626 m)  Wt 61.871 kg (136 lb 6.4 oz)  BMI 23.40 kg/m2  SpO2 100% Constitutional: Vital signs reviewed.  Patient is a well-developed and well-nourished in no acute distress and cooperative with exam. Alert and oriented x3.  Head: Normocephalic and atraumatic Mouth: no erythema or exudates, MMM Eyes: PERRL, EOMI, conjunctivae normal, No scleral icterus.  Neck: Supple, Trachea midline normal ROM, No JVD, mass, thyromegaly, or carotid bruit present.  Cardiovascular: RRR, S1 normal, S2 normal, no MRG, pulses symmetric and intact bilaterally Pulmonary/Chest: normal respiratory effort, CTAB, no wheezes, rales, or rhonchi Abdominal: Soft. Non-tender, non-distended, bowel sounds are normal, no masses, organomegaly, or guarding present.  GU: no CVA tenderness  Extremities: Mid right lower extremity area laterally with erythema and tenderness, +2 edema in right.left lower extremity within normal limits  Neurological: A&O x3, Strength is normal and symmetric bilaterally, cranial nerve II-XII are grossly intact, no focal motor deficit, sensory intact to light touch bilaterally.  Skin: Warm, dry and intact. No rash, cyanosis, or clubbing.  Psychiatric: Normal mood and affect. speech and behavior is normal.               Labs on Admission:  Basic Metabolic Panel:  Recent Labs Lab 09/16/13 1336  NA 138  K 5.4*  CL 104  CO2 24  GLUCOSE 98  BUN 37*  CREATININE 1.31  CALCIUM 9.9   Liver Function Tests:  Recent Labs Lab 09/16/13 1336  AST 39*  ALT 20  ALKPHOS 104  BILITOT 0.4  PROT 7.0  ALBUMIN 3.8   No results found for this basename: LIPASE, AMYLASE,  in the last 168 hours No results  found for this basename: AMMONIA,  in the last 168 hours CBC:  Recent Labs Lab 09/16/13 1336  WBC 10.0  NEUTROABS 7.2  HGB 11.5*  HCT 34.3*  MCV 100.9*  PLT 214   Cardiac Enzymes: No results found for this basename: CKTOTAL, CKMB, CKMBINDEX, TROPONINI,  in the last 168 hours  BNP (last 3 results)  Recent Labs  09/16/13 1315  PROBNP 1016.0*   CBG: No results found for this basename: GLUCAP,  in the last 168 hours  Radiological Exams on Admission: No results found.  EKG: Independently reviewed. NSR at 81, no acute ischemia changes  Assessment/Plan Active Problems: Cellulitis -Failed outpatient antibiotics with Keflex -As discussed above,  will continue empiric antibiotics with vancomycin -Follow up on pending Doppler of lower extremity UTI -Obtain urine culture -Her antibiotics with Rocephin and follow   HTN (hypertension), uncontrolled -Continue outpatient indications, and when  necessary labetalol and follow   Hypothyroidism -Continue Synthroid   Pacemaker-St[employer]Jude   Chronic kidney disease -Creatinine is 1.3, follow recheck   SSS (sick sinus syndrome) -Status post pacemaker in the past Hyperkalemia -mild, follow and recheck with hydration     Code Status: full Family Communication: wife at bedside Disposition Plan: admit to Tele  Time spent: >59mins  Kela Millin [facility] Pager [phone]

## 2013-09-16 NOTE — ED Notes (Signed)
Attempted Report 

## 2013-09-16 NOTE — Progress Notes (Signed)
Report given to receiving RN. Patient in bed resting with bed alarm on. No signs or symptoms of distress or discomfort noted.

## 2013-09-17 ENCOUNTER — Encounter (HOSPITAL_COMMUNITY): Payer: Self-pay | Admitting: General Practice

## 2013-09-17 ENCOUNTER — Ambulatory Visit: Payer: Medicare Other | Admitting: Internal Medicine

## 2013-09-17 ENCOUNTER — Telehealth: Payer: Self-pay | Admitting: Internal Medicine

## 2013-09-17 DIAGNOSIS — M79609 Pain in unspecified limb: Secondary | ICD-10-CM

## 2013-09-17 LAB — TROPONIN I: Troponin I: <0.3 ng/mL (ref <0.30)

## 2013-09-17 MED ORDER — DEXTROSE 5 % IV SOLN
1.0000 g | INTRAVENOUS | Status: DC
  Administered 2013-09-17: 1 g via INTRAVENOUS
  Filled 2013-09-17 (×2): qty 10

## 2013-09-17 NOTE — Progress Notes (Signed)
Report given to receiving RN. Patient in bed resting. No signs or symptoms of distress or discomfort noted.  

## 2013-09-17 NOTE — Progress Notes (Signed)
*  PRELIMINARY RESULTS* Vascular Ultrasound Lower extremity venous duplex has been completed.  Preliminary findings: No evidence of DVT bilaterally.  Incidental finding = Bilateral arterial system significantly calcified plaque throughout. Occlusion of right femoral artery with recanalized flow to left popliteal artery and monophasic flow in posterior tibial artery.  50-99% stenosis of left femoral artery, monophasic flow in left popliteal artery and posterior tibial artery.   Farrel DemarkJill Eunice, RDMS, RVT  09/17/2013, 12:17 PM

## 2013-09-17 NOTE — Progress Notes (Signed)
[name]  John NeighborsFritz Weaver [id number] DOB: [birth year] DOA: 09/16/2013 PCP: Margaree MackintoshBAXLEY,John J, MD  Assessment/Plan: Cellulitis  -Failed outpatient antibiotics with Keflex  -As discussed above, will continue empiric antibiotics with vancomycin  - Doppler of lower extremity negative for DVT UTI  -Await urine culture  -Continue antibiotics with Rocephin and follow  HTN (hypertension), uncontrolled  -Continue outpatient indications, and when necessary labetalol and follow  Hypothyroidism  -Continue Synthroid  Pacemaker-St[employer]Jude  Chronic kidney disease  -Creatinine is 1.3, follow recheck  SSS (sick sinus syndrome)  -Status post pacemaker in the past  Hyperkalemia  -mild, follow and recheck in a.m.   Code Status:DNR Family Communication: Wife at bedside Disposition Plan: To home when medically ready-possibly in a.m. if continues to improve.   Consultants:  None  Procedures:  Doppler ultrasound -Negative for DVT  Antibiotics:  Vancomycin started 6/15  Rocephin started 6/15  HPI/Subjective: Feeling better today, states less leg pain and redness  Objective: Filed Vitals:   09/17/13 1835  BP: 119/48  Pulse: 87  Temp: 97.7 F (36.5 C)  Resp: 18    Intake/Output Summary (Last 24 hours) at 09/17/13 1900 Last data filed at 09/17/13 1804  Gross per 24 hour  Intake    680 ml  Output   1475 ml  Net   -795 ml   Filed Weights   09/16/13 1055 09/16/13 1704 09/17/13 0440  Weight: 57.153 kg (126 lb) 61.871 kg (136 lb 6.4 oz) 60.827 kg (134 lb 1.6 oz)    Exam:  General: alert & oriented x 3 In NAD Cardiovascular: RRR, nl S1 s2 Respiratory: CTAB Abdomen: soft +BS NT/ND, no masses palpable Extremities: +1-2 edema, decreased erythema and decreased tenderness No cyanosis     Data Reviewed: Basic Metabolic Panel:  Recent Labs Lab 09/16/13 1336  NA 138  K 5.4*  CL 104  CO2 24  GLUCOSE 98  BUN 37*  CREATININE 1.31  CALCIUM 9.9   Liver  Function Tests:  Recent Labs Lab 09/16/13 1336  AST 39*  ALT 20  ALKPHOS 104  BILITOT 0.4  PROT 7.0  ALBUMIN 3.8   No results found for this basename: LIPASE, AMYLASE,  in the last 168 hours No results found for this basename: AMMONIA,  in the last 168 hours CBC:  Recent Labs Lab 09/16/13 1336  WBC 10.0  NEUTROABS 7.2  HGB 11.5*  HCT 34.3*  MCV 100.9*  PLT 214   Cardiac Enzymes:  Recent Labs Lab 09/16/13 1830 09/17/13 0045  TROPONINI <0.30 <0.30   BNP (last 3 results)  Recent Labs  09/16/13 1315  PROBNP 1016.0*   CBG: No results found for this basename: GLUCAP,  in the last 168 hours  No results found for this or any previous visit (from the past 240 hour(s)).   Studies: No results found.  Scheduled Meds: . cefTRIAXone (ROCEPHIN)  IV  1 g Intravenous Q24H  . clopidogrel  75 mg Oral Q breakfast  . enoxaparin (LOVENOX) injection  30 mg Subcutaneous Q24H  . levothyroxine  100 mcg Oral QAC breakfast  . lisinopril  20 mg Oral Daily  . multivitamin with minerals  1 tablet Oral Daily  . pantoprazole  40 mg Oral Q breakfast  . simvastatin  20 mg Oral q1800  . tamsulosin  0.4 mg Oral QPC supper  . [START ON 09/18/2013] vancomycin  1,000 mg Intravenous Q48H  . vitamin C  1,000 mg Oral Daily   Continuous Infusions:   Active Problems:  Hypothyroidism   Pacemaker-[employer]ude   Chronic kidney disease   SSS (sick sinus syndrome)   HTN (hypertension)   Cellulitis    Time spent: 25    Bayshore Medical CenterVIYUOH,John Mcsorley C  [facility] Pager [phone]. If 7PM-7AM, please contact night-coverage at [email or web address], password [email or web address] 09/17/2013, 7:00 PM  LOS: 1 day

## 2013-09-17 NOTE — Telephone Encounter (Signed)
Per Dr. Lenord FellersBaxley; patient has cellulitis, UTI and some heart failure.  He didn't respond to OP antibiotics of Keflex.  Patient is now on IV Vancomycin.  His length of stay is dependent on how well he responds to IV treatment.  Advised she should direct her questions to the nurses and/or Dr. Donnalee CurryAdeline Viyuoh.  Wife is aware of admitting doctors name.  As of the time of my call (10:45), wife states that patient didn't have IV in when she arrived this a.m.  Patient apparently pulled the IV out during the night.  Advised that she would need to speak with the nursing staff, but if he pulled the IV out, they may be waiting to put it back in when it's time for him to have next course of antibiotics.  Wife verbalizes understanding of conversation and instruction.

## 2013-09-17 NOTE — Care Management Note (Signed)
    Page 1 of 1   09/17/2013     3:21:17 PM CARE MANAGEMENT NOTE 09/17/2013  Patient:  John Weaver,John Weaver   Account Number:  [id number]  Date Initiated:  09/17/2013  Documentation initiated by:  Oletta CohnWOOD,CAMELLIA  Subjective/Objective Assessment:   80 y.o. male with history of hypertension, diastolic CHF, CKD., hypothyroidism who presents with worsening right leg swelling and redness.// Home with spouse.     Action/Plan:   Empiric Vanc  and Doppler ultrasound ordered.He is admitted for further evaluation and management.//Home with [facility]   Anticipated DC Date:  09/21/2013   Anticipated DC Plan:  HOME W HOME HEALTH SERVICES      DC Planning Services  CM consult      Choice offered to / List presented to:             Status of service:  In process, will continue to follow Medicare Important Message given?   (If response is "NO", the following Medicare IM given date fields will be blank) Date Medicare IM given:   Date Additional Medicare IM given:    Discharge Disposition:    Per UR Regulation:  Reviewed for med. necessity/level of care/duration of stay  If discussed at Long Length of Stay Meetings, dates discussed:    Comments:

## 2013-09-18 DIAGNOSIS — I359 Nonrheumatic aortic valve disorder, unspecified: Secondary | ICD-10-CM

## 2013-09-18 DIAGNOSIS — E785 Hyperlipidemia, unspecified: Secondary | ICD-10-CM

## 2013-09-18 LAB — BASIC METABOLIC PANEL
BUN: 27 mg/dL — AB (ref 6–23)
CO2: 22 mEq/L (ref 19–32)
Calcium: 9.5 mg/dL (ref 8.4–10.5)
Chloride: 102 mEq/L (ref 96–112)
Creatinine, Ser: 1.08 mg/dL (ref 0.50–1.35)
GFR calc Af Amer: 65 mL/min — ABNORMAL LOW (ref >90)
GFR calc non Af Amer: 56 mL/min — ABNORMAL LOW (ref >90)
Glucose, Bld: 91 mg/dL (ref 70–99)
Potassium: 4.5 mEq/L (ref 3.7–5.3)
Sodium: 140 mEq/L (ref 137–147)

## 2013-09-18 MED ORDER — DOXYCYCLINE HYCLATE 100 MG PO TABS: 100.0000 mg | ORAL_TABLET | Freq: Two times a day (BID) | ORAL | Status: AC

## 2013-09-18 NOTE — Discharge Summary (Signed)
Physician Discharge Summary  John Weaver ZOX:[id number] DOB: 04-27-1915 DOA: 09/16/2013  PCP: Margaree Mackintosh, MD  Admit date: 09/16/2013 Discharge date: 09/18/2013  Time spent: >30 minutes  Recommendations for Outpatient Follow-up:  BMET to follow electrolytes and renal function Reassess RLE cellulitis Reassess BP and adjust antihypertensive regimen if needed  Discharge Diagnoses:  Active Problems:   Hypothyroidism   Pacemaker-St.Jude   Chronic kidney disease   SSS (sick sinus syndrome)   HTN (hypertension)   Cellulitis   Discharge Condition: stable and improved. Will discharge home. Patient to follow with PCP in 10 days.  Diet recommendation: low sodium/heart healthy diet  Filed Weights   09/16/13 1704 09/17/13 0440 09/18/13 0453  Weight: 61.871 kg (136 lb 6.4 oz) 60.827 kg (134 lb 1.6 oz) 57.924 kg (127 lb 11.2 oz)    History of present illness:  80 y.o. male with history of hypertension, diastolic CHF, CKD., hypothyroidism who presents with above complaints. He states that he developed redness and swelling in his right leg last week-saw his PCP and was started on Keflex (6/9). Initially he seemed to be improving but per wife the redness and swelling got worse yesterday and she called PCP this a.m. and was asked to come to the ED. He denies fevers, shortness of breath, nausea or vomiting diarrhea and chest pain. His wife states that he has had some occasional confusion. In the ED he was started on empiric Vanc for cellulitis and Doppler ultrasound ordered. Urinalysis is consistent with a UTI. He is admitted for further evaluation and management.   Hospital Course:  1-Cellulitis: failed outpatient treatment to keflex -significant imp[rovement with IV antibiotics -will discharge on doxycycline for 5 more days to complete therapy -advise to Keep leg elevated and use PRN cold compresses -will follow with PCP in 10 days  2-UTI: probably from stasis due to BPH. -received 5 days  of keflex prior to admission and then 3 days of vanc and rocephin IV during hospitalization -for his cellulitis will be discharge on doxycycline for 5 more days -UTI will be treated with current abx's regimen  4-HTN: stable -continue current antihypertensive regimen -advise to follow low sodium diet  5-Hypothyroidism: continue synthroid  6-hyperkalemia: resolved  7-SSS: status post ST Jude pacemaker implantation; no abnormalities seen on tele.  8-CKD: remains stable during hospital -at discharge Cr 1.08   Procedures: See below for x-ray reports  Consultations:  None   Discharge Exam: Filed Vitals:   09/18/13 0923  BP: 116/45  Pulse: 82  Temp: 97.7 F (36.5 C)  Resp: 18   General: alert & oriented x 3, In NAD; afebrile  Cardiovascular: RRR, nl S1 s2; positive SEM Respiratory: CTA Bilaterally Abdomen: soft +BS NT/ND, no masses palpable  Extremities: trace edema, decreased/almost resolved erythema on RLE; no tenderness an dno warm sensation   Discharge Instructions You were cared for by a hospitalist during your hospital stay. If you have any questions about your discharge medications or the care you received while you were in the hospital after you are discharged, you can call the unit and asked to speak with the hospitalist on call if the hospitalist that took care of you is not available. Once you are discharged, your primary care physician will handle any further medical issues. Please note that NO REFILLS for any discharge medications will be authorized once you are discharged, as it is imperative that you return to your primary care physician (or establish a relationship with a primary care physician if you do  not have one) for your aftercare needs so that they can reassess your need for medications and monitor your lab values.  Discharge Instructions   Diet - low sodium heart healthy    Complete by:  As directed      Discharge instructions    Complete by:  As  directed   Take medications as prescribed Arrange follow up with PCP in 10 days Follow a low sodium diet (less than 2 grams daily)            Medication List    STOP taking these medications       cephALEXin 500 MG capsule  Commonly known as:  KEFLEX      TAKE these medications       clopidogrel 75 MG tablet  Commonly known as:  PLAVIX  Take 75 mg by mouth daily with breakfast.     doxycycline 100 MG tablet  Commonly known as:  VIBRA-TABS  Take 1 tablet (100 mg total) by mouth 2 (two) times daily.     esomeprazole 40 MG capsule  Commonly known as:  NEXIUM  Take 1 capsule (40 mg total) by mouth daily.     EX-LAX 15 MG Tabs  Generic drug:  Sennosides  Take 15 mg by mouth at bedtime as needed. For constipation     levothyroxine 100 MCG tablet  Commonly known as:  SYNTHROID, LEVOTHROID  Take 1 tablet (100 mcg total) by mouth daily.     lisinopril 10 MG tablet  Commonly known as:  PRINIVIL,ZESTRIL  Take 20 mg by mouth daily.     multivitamins ther. w/minerals Tabs tablet  Take 1 tablet by mouth daily.     oxyCODONE-acetaminophen 5-325 MG per tablet  Commonly known as:  PERCOCET/ROXICET  Take 1 tablet by mouth 2 (two) times daily as needed for severe pain.     pravastatin 40 MG tablet  Commonly known as:  PRAVACHOL  Take 1 tablet (40 mg total) by mouth at bedtime.     tamsulosin 0.4 MG Caps capsule  Commonly known as:  FLOMAX  Take 0.4 mg by mouth daily after supper.     TYLENOL PM EXTRA STRENGTH PO  Take 1-2 tablets by mouth at bedtime as needed. For sleep     vitamin C 1000 MG tablet  Take 1,000 mg by mouth daily.       Allergies  Allergen Reactions  . Sulfa Antibiotics Rash       Follow-up Information   Follow up with Margaree MackintoshBAXLEY,[name], MD. Schedule an appointment as soon as possible for a visit in 10 days.   Specialty:  Internal Medicine   Contact information:   [address] [place] [id number] [phone]        The results  of significant diagnostics from this hospitalization (including imaging, microbiology, ancillary and laboratory) are listed below for reference.    Labs: Basic Metabolic Panel:  Recent Labs Lab 09/16/13 1336 09/18/13 0539  NA 138 140  K 5.4* 4.5  CL 104 102  CO2 24 22  GLUCOSE 98 91  BUN 37* 27*  CREATININE 1.31 1.08  CALCIUM 9.9 9.5   Liver Function Tests:  Recent Labs Lab 09/16/13 1336  AST 39*  ALT 20  ALKPHOS 104  BILITOT 0.4  PROT 7.0  ALBUMIN 3.8   CBC:  Recent Labs Lab 09/16/13 1336  WBC 10.0  NEUTROABS 7.2  HGB 11.5*  HCT 34.3*  MCV 100.9*  PLT 214   Cardiac Enzymes:  Recent Labs Lab 09/16/13 1830 09/17/13 0045  TROPONINI <0.30 <0.30   BNP: BNP (last 3 results)  Recent Labs  09/16/13 1315  PROBNP 1016.0*    Signed:  Vassie LollMadera, Carlos  [facility] 09/18/2013, 12:15 PM

## 2013-09-18 NOTE — Progress Notes (Signed)
Patient taken out of hospital for discharge via wheelchair.  

## 2013-09-18 NOTE — Progress Notes (Signed)
Discharge instructions discussed with wife, Hale Bogusorter. F/u appt given. Discussed low sodium diet. Wife able to use teachback. Telemetry discontinued, CCMD notified.

## 2013-09-19 LAB — URINE CULTURE
COLONY COUNT: NO GROWTH
Culture: NO GROWTH

## 2013-09-22 ENCOUNTER — Encounter (HOSPITAL_COMMUNITY): Payer: Self-pay | Admitting: Emergency Medicine

## 2013-09-22 ENCOUNTER — Emergency Department (HOSPITAL_COMMUNITY)
Admission: EM | Admit: 2013-09-22 | Discharge: 2013-09-22 | Disposition: A | Discharge status: Home / Self Care | Payer: Medicare Other | Attending: Emergency Medicine | Admitting: Emergency Medicine

## 2013-09-22 DIAGNOSIS — Z87442 Personal history of urinary calculi: Secondary | ICD-10-CM | POA: Insufficient documentation

## 2013-09-22 DIAGNOSIS — M25559 Pain in unspecified hip: Secondary | ICD-10-CM | POA: Insufficient documentation

## 2013-09-22 DIAGNOSIS — G8929 Other chronic pain: Secondary | ICD-10-CM

## 2013-09-22 DIAGNOSIS — G894 Chronic pain syndrome: Secondary | ICD-10-CM | POA: Insufficient documentation

## 2013-09-22 DIAGNOSIS — Z872 Personal history of diseases of the skin and subcutaneous tissue: Secondary | ICD-10-CM | POA: Insufficient documentation

## 2013-09-22 DIAGNOSIS — K219 Gastro-esophageal reflux disease without esophagitis: Secondary | ICD-10-CM | POA: Insufficient documentation

## 2013-09-22 DIAGNOSIS — M25519 Pain in unspecified shoulder: Secondary | ICD-10-CM | POA: Insufficient documentation

## 2013-09-22 DIAGNOSIS — Z87891 Personal history of nicotine dependence: Secondary | ICD-10-CM | POA: Insufficient documentation

## 2013-09-22 DIAGNOSIS — E7800 Pure hypercholesterolemia, unspecified: Secondary | ICD-10-CM | POA: Insufficient documentation

## 2013-09-22 DIAGNOSIS — Z7902 Long term (current) use of antithrombotics/antiplatelets: Secondary | ICD-10-CM | POA: Insufficient documentation

## 2013-09-22 DIAGNOSIS — R071 Chest pain on breathing: Secondary | ICD-10-CM | POA: Insufficient documentation

## 2013-09-22 DIAGNOSIS — I509 Heart failure, unspecified: Secondary | ICD-10-CM | POA: Insufficient documentation

## 2013-09-22 DIAGNOSIS — I10 Essential (primary) hypertension: Secondary | ICD-10-CM | POA: Insufficient documentation

## 2013-09-22 DIAGNOSIS — Z79899 Other long term (current) drug therapy: Secondary | ICD-10-CM | POA: Insufficient documentation

## 2013-09-22 DIAGNOSIS — E039 Hypothyroidism, unspecified: Secondary | ICD-10-CM | POA: Insufficient documentation

## 2013-09-22 DIAGNOSIS — Z8673 Personal history of transient ischemic attack (TIA), and cerebral infarction without residual deficits: Secondary | ICD-10-CM | POA: Insufficient documentation

## 2013-09-22 DIAGNOSIS — Z792 Long term (current) use of antibiotics: Secondary | ICD-10-CM | POA: Insufficient documentation

## 2013-09-22 DIAGNOSIS — Z87448 Personal history of other diseases of urinary system: Secondary | ICD-10-CM | POA: Insufficient documentation

## 2013-09-22 MED ORDER — OXYCODONE-ACETAMINOPHEN 5-325 MG PO TABS: 1.0000 | ORAL_TABLET | Freq: Three times a day (TID) | ORAL | Status: DC | PRN

## 2013-09-22 NOTE — Discharge Instructions (Signed)
Not every illness or injury can be identified during an emergency department visit, thus follow-up with your primary healthcare provider is important. Medical conditions can also worsen, so it is also important to return immediately as directed below, or if you have other serious concerns develop. °RETURN IMMEDIATELY IF you develop new shortness of breath, chest pain, fever, have difficulty moving parts of your body (new weakness, numbness, or incoordination), sudden change in speech, vision, swallowing, or understanding, faint or develop new dizziness, severe headache, become poorly responsive or have an altered mental status compared to baseline for you, new rash, abdominal pain, or bloody stools,  °Return sooner also if you develop new problems for which you have not talked to your caregiver but you feel may be emergency medical conditions, or are unable to be cared for safely at home. °

## 2013-09-22 NOTE — ED Provider Notes (Signed)
CSN: [id number]     Arrival date & time 09/22/13  0910 History   First MD Initiated Contact with Patient 09/22/13 1046     Chief Complaint  Patient presents with  . Extremity Pain     (Consider location/radiation/quality/duration/timing/severity/associated sxs/prior Treatment) HPI 80 year old male lives with his wife at baseline the patient has chronic generalized weakness and uses a walker for stability at baseline the patient has chronic severe pain to his left shoulder left chest wall and the left hip area and takes oxycodone for those chronic pains which have been evaluated multiple times by his Dr. and cardiology in the past; patient accidentally ran out of his Percocet 3 days ago and is having an exacerbation of his typical chronic pain syndrome with no new chest pain no shortness breath no cough no fever no change in mental status no abdominal pain no vomiting no sweats no hallucinations no seizures no new focal or lateralizing weakness or numbness no falls no trauma and no other concerns.  Past Medical History  Diagnosis Date  . Back pain   . Hypertension   . Hypercholesteremia   . Hypothyroidism   . Stroke 02/2012    tia  . History of CHF (congestive heart failure)   . Hemorrhoids   . DDD (degenerative disc disease)     of the spine  . Kidney stones   . Nephritis     at age 80  . Bradycardia     with snycope  . SSS (sick sinus syndrome)     in setting of syncope s/p PPM  . CHF (congestive heart failure)   . Dysrhythmia   . GERD (gastroesophageal reflux disease)   . Cellulitis and abscess of leg 09/16/2013    RT LEG   Past Surgical History  Procedure Laterality Date  . Knee surgery      left knee  . Joint replacement      Left knee replacement  . Insert / replace / remove pacemaker     Family History  Problem Relation Age of Onset  . Kidney disease Mother   . Kidney disease Father    History  Substance Use Topics  . Smoking status: Former Smoker    Types:  Pipe  . Smokeless tobacco: Never Used  . Alcohol Use: 0.6 oz/week    1 Glasses of wine per week    Review of Systems  10 Systems reviewed and are negative for acute change except as noted in the HPI.  Allergies  Doxycycline and Sulfa antibiotics  Home Medications   Prior to Admission medications   Medication Sig Start Date End Date Taking? Authorizing Provider  Ascorbic Acid (VITAMIN C) 1000 MG tablet Take 1,000 mg by mouth daily.   Yes Historical Provider, MD  clopidogrel (PLAVIX) 75 MG tablet Take 75 mg by mouth daily with breakfast.   Yes Historical Provider, MD  Diphenhydramine-APAP, sleep, (TYLENOL PM EXTRA STRENGTH PO) Take 2 tablets by mouth at bedtime as needed (for sleep).   Yes Historical Provider, MD  doxycycline (VIBRA-TABS) 100 MG tablet Take 1 tablet (100 mg total) by mouth 2 (two) times daily. 09/18/13 09/23/13 Yes Vassie Lollarlos Madera, MD  esomeprazole (NEXIUM) 40 MG capsule Take 1 capsule (40 mg total) by mouth daily. 08/27/13  Yes Margaree MackintoshMary J [name], MD  levothyroxine (SYNTHROID, LEVOTHROID) 100 MCG tablet Take 1 tablet (100 mcg total) by mouth daily. 07/17/12  Yes Margaree MackintoshMary J [name], MD  lisinopril (PRINIVIL,ZESTRIL) 10 MG tablet Take 20 mg by mouth  daily. 07/17/12  Yes Margaree MackintoshMary J [name], MD  Multiple Vitamins-Minerals (MULTIVITAMINS THER. W/MINERALS) TABS Take 1 tablet by mouth daily.   Yes Historical Provider, MD  mupirocin ointment (BACTROBAN) 2 % Apply 1 application topically 2 (two) times daily as needed (for roughness on right foot).    Yes Historical Provider, MD  oxyCODONE-acetaminophen (PERCOCET/ROXICET) 5-325 MG per tablet Take 1 tablet by mouth 2 (two) times daily as needed for severe pain. 08/27/13  Yes Margaree MackintoshMary J [name], MD  pravastatin (PRAVACHOL) 40 MG tablet Take 1 tablet (40 mg total) by mouth at bedtime. 07/17/12  Yes Margaree MackintoshMary J [name], MD  Sennosides (EX-LAX) 15 MG TABS Take 15 mg by mouth at bedtime as needed. For constipation   Yes Historical Provider, MD  tamsulosin (FLOMAX) 0.4 MG  CAPS capsule Take 0.4 mg by mouth daily after supper.  05/14/13  Yes Historical Provider, MD  oxyCODONE-acetaminophen (PERCOCET) 5-325 MG per tablet Take 1-2 tablets by mouth every 8 (eight) hours as needed for severe pain. 09/22/13   Hurman HornJohn M Bednar, MD   BP 161/76  Pulse 77  Temp(Src) 97.9 F (36.6 C) (Oral)  Resp 21  SpO2 100% Physical Exam  Nursing note and vitals reviewed. Constitutional:  Awake, alert, nontoxic appearance.  HENT:  Head: Atraumatic.  Eyes: Right eye exhibits no discharge. Left eye exhibits no discharge.  Neck: Neck supple.  Pulmonary/Chest: Effort normal. He exhibits no tenderness.  Abdominal: Soft. There is no tenderness. There is no rebound.  Musculoskeletal: He exhibits no edema and no tenderness.  Baseline ROM, no obvious new focal weakness. Capillary refill less than 2 seconds all 4 extremities. Right lower leg has minimal erythema minimally tender and is significantly improved compared to when he was hospitalized recently for cellulitis to his right leg.  Neurological: He is alert.  Mental status and motor strength appears baseline for patient and situation.  Skin: No rash noted.  Psychiatric: He has a normal mood and affect.    ED Course  Procedures (including critical care time) The patient and his wife prefer no lab testing or imaging or further evaluation in the emergency department. They're happy with a prescription for Percocet for the patient and he will followup with his primary care doctor for long-term management of his chronic pain syndrome. Patient / Family / Caregiver informed of clinical course, understand medical decision-making process, and agree with plan. Labs Review Labs Reviewed - No data to display  Imaging Review No results found.   EKG Interpretation None      MDM   Final diagnoses:  Chronic pain    I doubt any other EMC precluding discharge at this time including, but not necessarily limited to the following:ACS,  CVA.    Hurman HornJohn M Bednar, MD 09/23/13 [phone]

## 2013-09-22 NOTE — ED Notes (Signed)
Pt reports 7/10 "dull" pain to left side of body x 6 weeks. States pain is to left side of head, left chest, left arm and left leg. Denies movement makes worse. Denies N/V/D. Neuro intact, grips equal, denies numbness/tingling, no arm drift. Denies blurred vision, dizziness, lightheadedness. Denies SOB, diaphoresis. NAD.

## 2013-09-22 NOTE — ED Notes (Signed)
Pt getting dressed for discharge pt at bedside awaiting discharge paperwork

## 2013-09-29 IMAGING — CT CT HEAD W/O CM
1 series · 15 of 30 positions shown, 19 images · non-contrast
Comparison: Head CT 02/17/2012.

CLINICAL DATA: History of fall with head trauma.

CT HEAD WITHOUT CONTRAST
TECHNIQUE: Contiguous axial images were obtained from the base of
the skull through the vertex without contrast.

[Series 2: head trauma 4.8 h37s · axial · 0.46mm/px · z∈[-143,+17]mm · 15 of 36 slices shown, 19 images]
[im 2/36  brain]
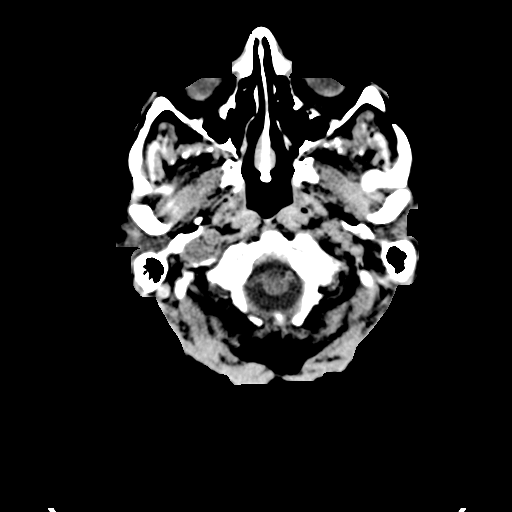
[im 2/36  bone]
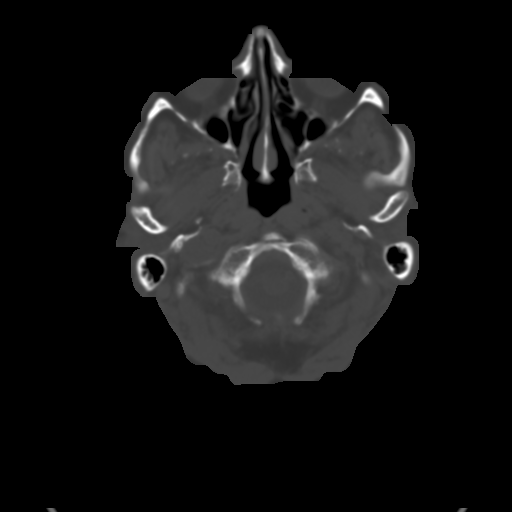
[im 4/36  brain]
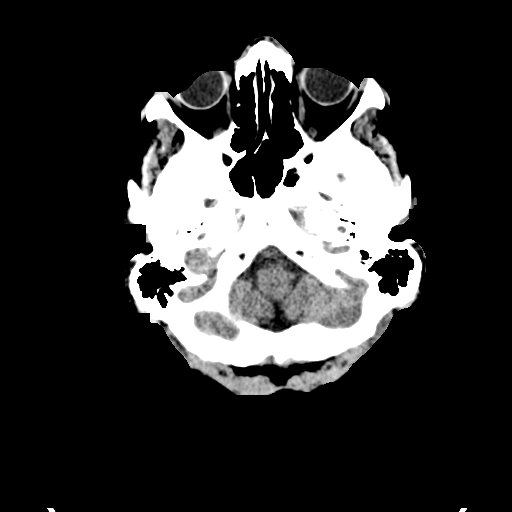
[im 7/36  brain]
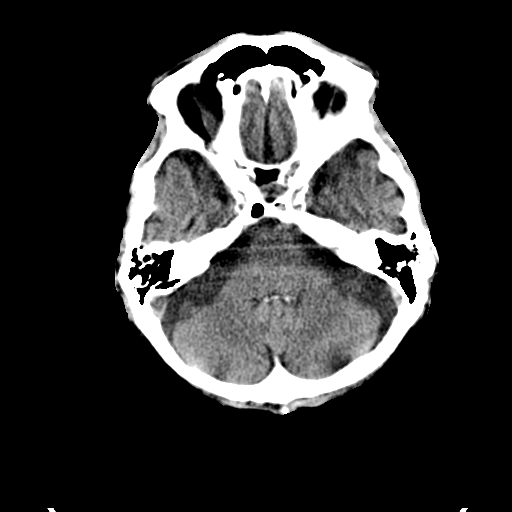
[im 9/36  brain]
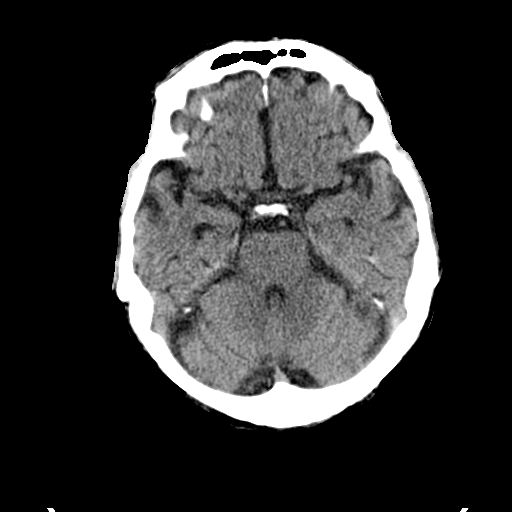
[im 11/36  brain]
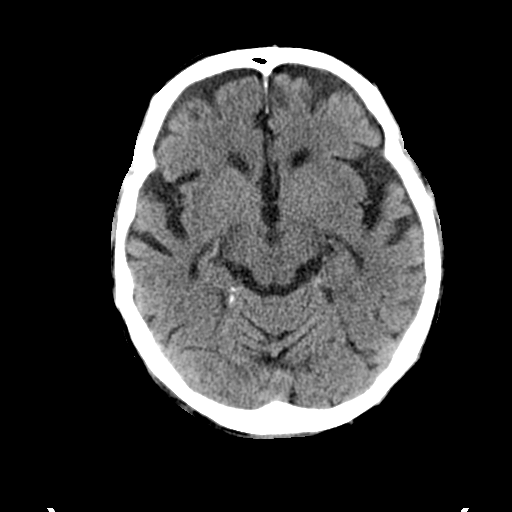
[im 11/36  bone]
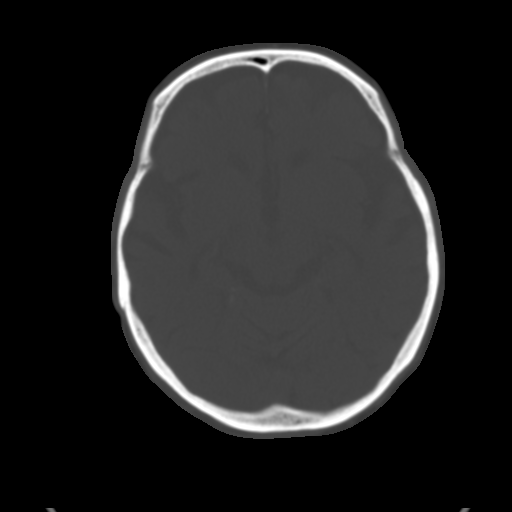
[im 14/36  brain]
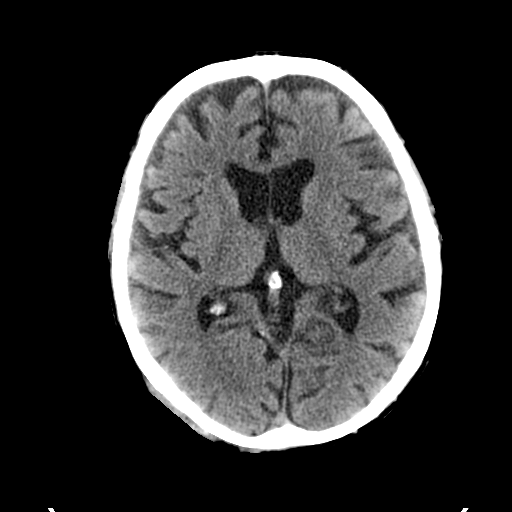
[im 16/36  brain]
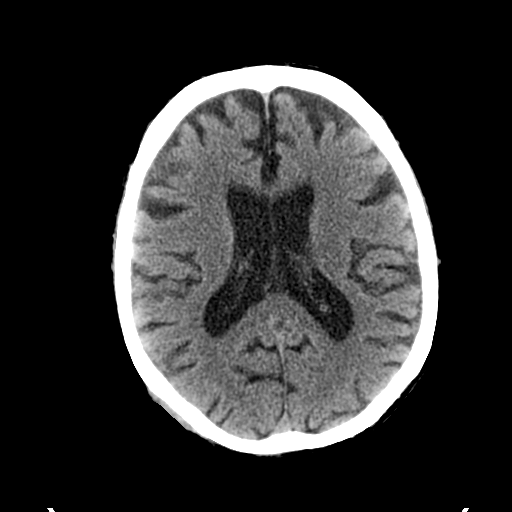
[im 19/36  brain]
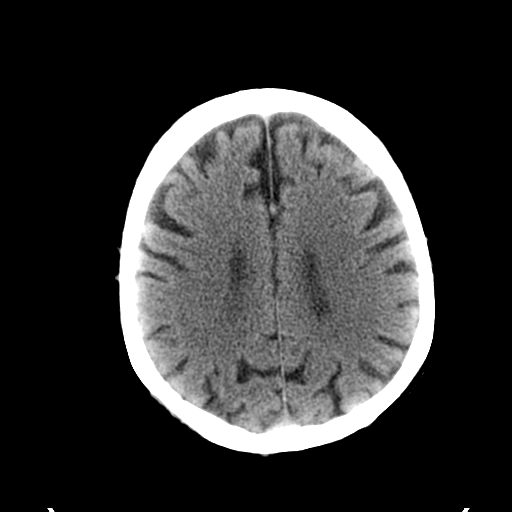
[im 20/36  brain]
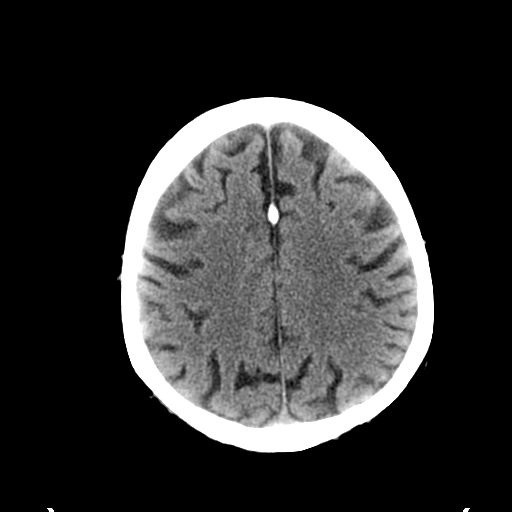
[im 20/36  bone]
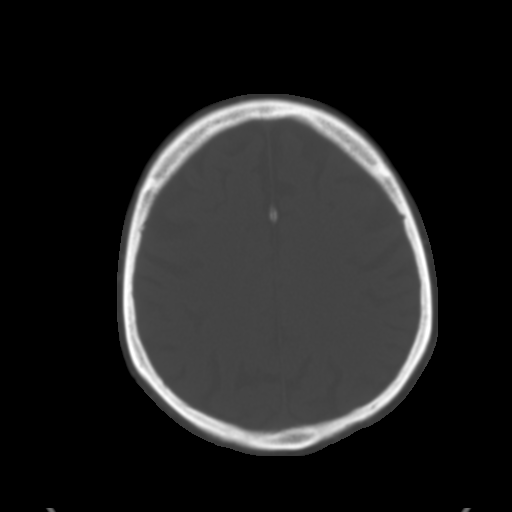
[im 22/36  brain]
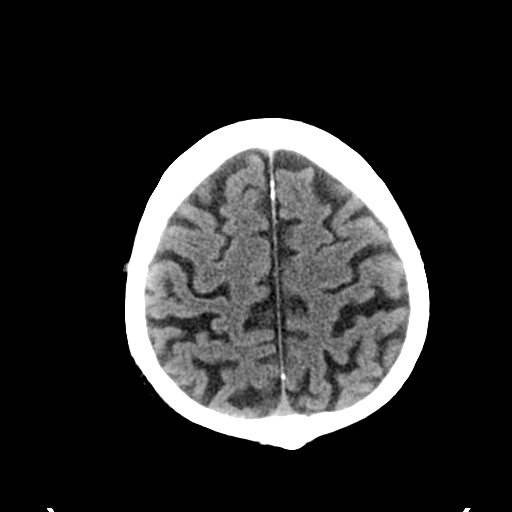
[im 25/36  brain]
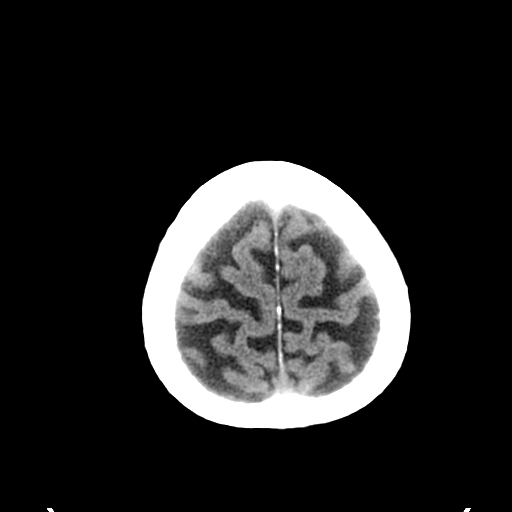
[im 27/36  brain]
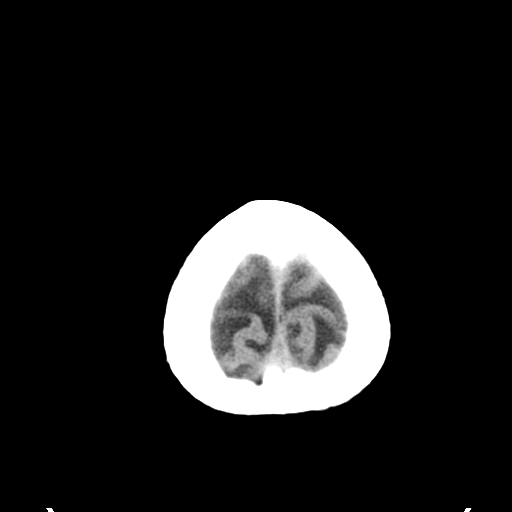
[im 29/36  brain]
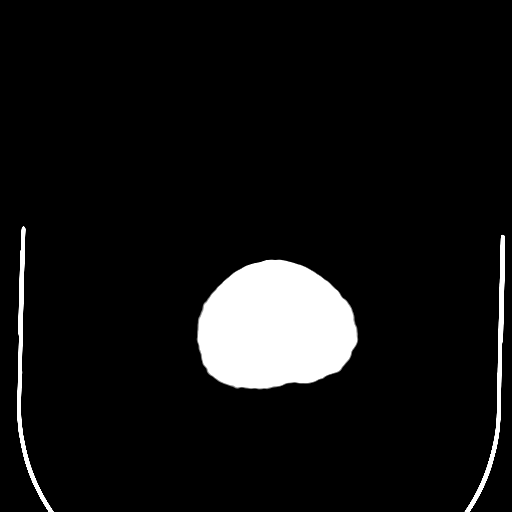
[im 29/36  bone]
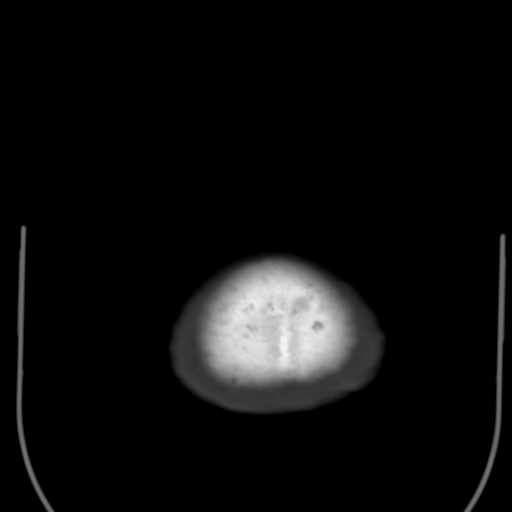
[im 32/36  brain]
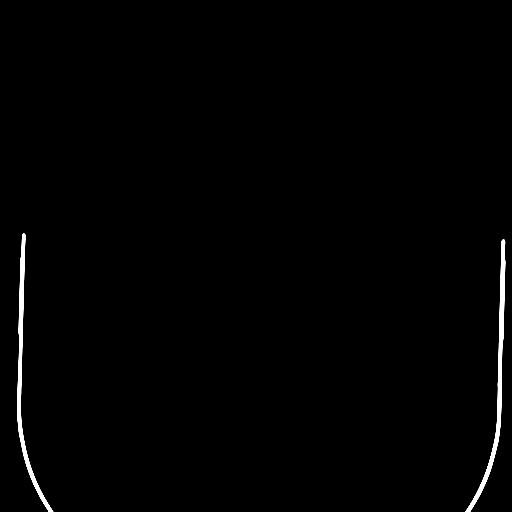
[im 34/36  brain]
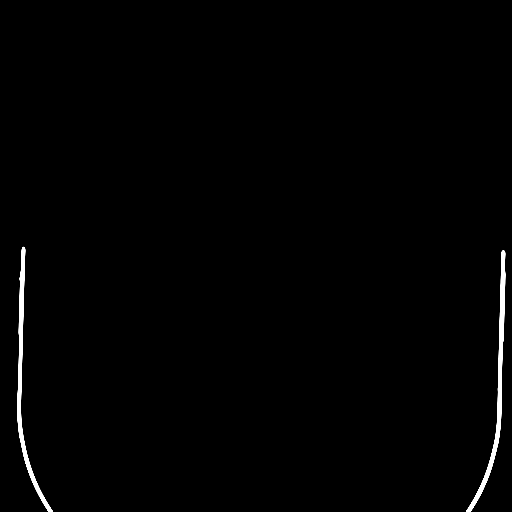

[15 of 30 positions shown; findings below may reference images not displayed]

FINDINGS: Moderate cerebral and cerebellar atrophy is age
appropriate.  There are some patchy areas of mild decreased
attenuation throughout the deep and periventricular white matter of
the cerebral hemispheres bilaterally, compatible with chronic
microvascular ischemic changes. No acute displaced skull fractures
are identified.  No acute intracranial abnormality.  Specifically,
no evidence of acute post-traumatic intracranial hemorrhage, no
definite regions of acute/subacute cerebral ischemia, no focal
mass, mass effect, hydrocephalus or abnormal intra or extra-axial
fluid collections.  The visualized paranasal sinuses and mastoids
are well pneumatized.
IMPRESSION: 1.  No acute displaced skull fractures or findings to suggest
significant acute traumatic injury to the brain.
2.  Moderate cerebral and cerebellar atrophy with very mild chronic
microvascular ischemic changes in the cerebral white matter, as
above.

## 2013-09-30 ENCOUNTER — Ambulatory Visit (INDEPENDENT_AMBULATORY_CARE_PROVIDER_SITE_OTHER): Payer: Medicare Other | Admitting: Internal Medicine

## 2013-09-30 ENCOUNTER — Encounter: Payer: Self-pay | Admitting: Internal Medicine

## 2013-09-30 VITALS — BP 142/76 | Temp 98.2°F | Wt 132.0 lb

## 2013-09-30 DIAGNOSIS — R7989 Other specified abnormal findings of blood chemistry: Secondary | ICD-10-CM

## 2013-09-30 DIAGNOSIS — R799 Abnormal finding of blood chemistry, unspecified: Secondary | ICD-10-CM

## 2013-09-30 DIAGNOSIS — L03119 Cellulitis of unspecified part of limb: Secondary | ICD-10-CM

## 2013-09-30 DIAGNOSIS — L02419 Cutaneous abscess of limb, unspecified: Secondary | ICD-10-CM

## 2013-09-30 DIAGNOSIS — L299 Pruritus, unspecified: Secondary | ICD-10-CM

## 2013-09-30 DIAGNOSIS — L03115 Cellulitis of right lower limb: Secondary | ICD-10-CM

## 2013-09-30 DIAGNOSIS — K219 Gastro-esophageal reflux disease without esophagitis: Secondary | ICD-10-CM

## 2013-09-30 MED ORDER — LEVOFLOXACIN 250 MG PO TABS: 250.0000 mg | ORAL_TABLET | Freq: Every day | ORAL | Status: DC

## 2013-09-30 MED ORDER — FUROSEMIDE 20 MG PO TABS: 20.0000 mg | ORAL_TABLET | Freq: Every day | ORAL | Status: DC

## 2013-09-30 MED ORDER — MUPIROCIN 2 % EX OINT: 1.0000 "application " | TOPICAL_OINTMENT | Freq: Two times a day (BID) | CUTANEOUS | Status: DC

## 2013-09-30 NOTE — Progress Notes (Signed)
   Subjective:    Patient ID: John NeighborsFritz Weaver, male    DOB: June 29, 1915, 80 y.o.   MRN: [id number]  HPI Here today to followup on cellulitis right lower extremity. He was hospitalized June 15 for cellulitis right lower extremity not responding to Keflex. He was treated with IV vancomycin and improved. He was discharged home on doxycycline and has finished that course of treatment. Reports that his right leg is still swelling and somewhat red. He has been placed on Nexium for GE reflux and that seems to be helping. They will have it refilled. He has some itching on his back. There is no evidence of rash. We have previously prescribed triamcinolone 0.1% in Eucerin he would like a refill on that. He had an elevated BNP of 1016 when hospitalized June 15. He did have a Doppler of the right lower extremity on June 16 which was negative for DVT    Review of Systems     Objective:   Physical Exam The right lower extremity is swollen with trace pitting edema. It has increased warmth. There are discrete erythematous macules on the mid right lower leg. They were present previously when he was treated with Keflex. One has an abraded area but there is no evidence of secondary infection. No evidence of dermatitis/rash on his back.        Assessment & Plan:  Cellulitis right lower extremity  Elevated BNP consistent with mild congestive heart failure  Right lower extremity edema  Nonspecific dermatitis of back  Plan: Lasix 20 mg daily. Nexium refilled. Triamcinolone cream 0.1% 60 g in 4 ounces of Eucerin to use on back 3 times daily as needed. Levaquin 250 mg daily for 10 days. If lower extremity cellulitis is not improving I think he needs a dermatology consult.

## 2013-09-30 NOTE — Patient Instructions (Signed)
Start Lasix 20 mg daily. Have potassium checked in 2 weeks. Levaquin 250 mg daily for 10 days. Refill Nexium. Refill triamcinolone and Eucerin cream.

## 2013-10-16 ENCOUNTER — Other Ambulatory Visit: Payer: Self-pay

## 2013-10-16 MED ORDER — HYDROCODONE-ACETAMINOPHEN 7.5-325 MG PO TABS: 1.0000 | ORAL_TABLET | Freq: Two times a day (BID) | ORAL | Status: DC | PRN

## 2013-10-16 MED ORDER — HYDROCODONE-ACETAMINOPHEN 5-325MG PREPACK (~~LOC~~: 1.0000 | ORAL_TABLET | Freq: Once | ORAL | Status: DC

## 2013-10-16 MED ORDER — HYDROCODONE-ACETAMINOPHEN 5-325 MG PO TABS: 1.0000 | ORAL_TABLET | Freq: Two times a day (BID) | ORAL | Status: DC | PRN

## 2013-10-17 ENCOUNTER — Encounter: Payer: Self-pay | Admitting: Internal Medicine

## 2013-10-17 ENCOUNTER — Ambulatory Visit (INDEPENDENT_AMBULATORY_CARE_PROVIDER_SITE_OTHER): Payer: Medicare Other | Admitting: Internal Medicine

## 2013-10-17 VITALS — BP 136/78 | HR 68 | Temp 98.1°F | Wt 130.0 lb

## 2013-10-17 DIAGNOSIS — H3530 Unspecified macular degeneration: Secondary | ICD-10-CM

## 2013-10-17 DIAGNOSIS — I509 Heart failure, unspecified: Secondary | ICD-10-CM

## 2013-10-17 DIAGNOSIS — I350 Nonrheumatic aortic (valve) stenosis: Secondary | ICD-10-CM

## 2013-10-17 DIAGNOSIS — R7989 Other specified abnormal findings of blood chemistry: Secondary | ICD-10-CM

## 2013-10-17 DIAGNOSIS — I359 Nonrheumatic aortic valve disorder, unspecified: Secondary | ICD-10-CM

## 2013-10-17 DIAGNOSIS — R609 Edema, unspecified: Secondary | ICD-10-CM

## 2013-10-17 DIAGNOSIS — R799 Abnormal finding of blood chemistry, unspecified: Secondary | ICD-10-CM

## 2013-10-17 DIAGNOSIS — R600 Localized edema: Secondary | ICD-10-CM

## 2013-10-17 LAB — BASIC METABOLIC PANEL
BUN: 58 mg/dL — ABNORMAL HIGH (ref 6–23)
CALCIUM: 9.2 mg/dL (ref 8.4–10.5)
CHLORIDE: 99 meq/L (ref 96–112)
CO2: 27 mEq/L (ref 19–32)
CREATININE: 1.63 mg/dL — AB (ref 0.50–1.35)
Glucose, Bld: 98 mg/dL (ref 70–99)
Potassium: 5.4 mEq/L — ABNORMAL HIGH (ref 3.5–5.3)
Sodium: 134 mEq/L — ABNORMAL LOW (ref 135–145)

## 2013-10-17 LAB — BRAIN NATRIURETIC PEPTIDE: Brain Natriuretic Peptide: 147.8 pg/mL — ABNORMAL HIGH (ref 0.0–100.0)

## 2013-10-17 NOTE — Patient Instructions (Addendum)
Continue Lasix as previously prescribed. Basic metabolic panel and BNP drawn today. Return in 4-6 months or when necessary

## 2013-10-17 NOTE — Progress Notes (Signed)
   Subjective:    Patient ID: John NeighborsFritz Gresham, male    DOB: 11-13-1915, 80 y.o.   MRN: [id number]  HPI In today to followup on cellulitis right lower extremity. Seems to have improved considerably. Still has some residual circular areas of erythema but these are not warm and tender. Doesn't feel he's responded to injections for visual disturbance. 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 This seems to make him slightly sad at times. I don't think he needs antidepressant at the present time. He is finding other things to do. He sleeps a fair amount according to his wife.     Review of Systems     Objective:   Physical Exam Skin warm and dry. Neck is supple without JVD thyromegaly or carotid bruits. Chest clear. Cardiac exam: Regular rate and rhythm, murmur is unchanged. Extremities trace pitting edema bilaterally with cellulitis resolved. He is pleasant and alert. Looking forward to doing outdoor sculpture with an old water heater. Alert and oriented x3. Conversation is pleasant and appropriate.       Assessment & Plan:  Dependent edema  History of elevated BNP-treated with diuretic  Cellulitis right lower extremity-resolved  Macular degeneration  History of aortic stenosis-echo did not show this to be critical and was evaluated by cardiologist  History of TIA  Hip pain treated sparingly with hydrocodone/APAP  Plan: Return in 4-6 months or when necessary

## 2013-10-18 NOTE — Progress Notes (Signed)
Results given to patient's wife. Will bring him back on 10/24/2013.

## 2013-10-24 ENCOUNTER — Other Ambulatory Visit: Payer: Medicare Other | Admitting: Internal Medicine

## 2013-10-24 DIAGNOSIS — E860 Dehydration: Secondary | ICD-10-CM

## 2013-10-24 LAB — BASIC METABOLIC PANEL
BUN: 48 mg/dL — ABNORMAL HIGH (ref 6–23)
CHLORIDE: 101 meq/L (ref 96–112)
CO2: 25 mEq/L (ref 19–32)
CREATININE: 1.55 mg/dL — AB (ref 0.50–1.35)
Calcium: 9.4 mg/dL (ref 8.4–10.5)
Glucose, Bld: 96 mg/dL (ref 70–99)
Potassium: 5.3 mEq/L (ref 3.5–5.3)
SODIUM: 137 meq/L (ref 135–145)

## 2013-10-30 ENCOUNTER — Other Ambulatory Visit: Payer: Self-pay | Admitting: Internal Medicine

## 2013-10-30 ENCOUNTER — Telehealth: Payer: Self-pay | Admitting: Internal Medicine

## 2013-10-30 MED ORDER — HYDROCODONE-ACETAMINOPHEN 5-325 MG PO TABS: 1.0000 | ORAL_TABLET | Freq: Three times a day (TID) | ORAL | Status: DC

## 2013-10-30 MED ORDER — PAROXETINE HCL 10 MG PO TABS: 10.0000 mg | ORAL_TABLET | Freq: Every day | ORAL | Status: DC

## 2013-10-30 NOTE — Telephone Encounter (Signed)
Wife says he is probably taking more than 2 hydrocodone/APAP 5/325 tablets daily. She is going to now take control of this medication. Says he is now out of this after having gotten #40 on July 15. Thinks he may be depressed because of losing eyesight. I injections with Dr. Luciana Axeankin have not worked very well for macular degeneration. I have refilled hydrocodone/APAP 5/325 #60 today. He may take up to 3 a day if he has significant pain. Return to start him on Paxil 10 mg daily to see if that will help with depression and pain. Wife was instructed to give him half of a 10 mg tablet for several days.

## 2013-11-04 ENCOUNTER — Other Ambulatory Visit: Payer: Self-pay | Admitting: Internal Medicine

## 2013-11-21 ENCOUNTER — Encounter: Payer: Self-pay | Admitting: Cardiology

## 2013-11-21 ENCOUNTER — Ambulatory Visit (INDEPENDENT_AMBULATORY_CARE_PROVIDER_SITE_OTHER): Payer: Medicare Other | Admitting: *Deleted

## 2013-11-21 ENCOUNTER — Ambulatory Visit (INDEPENDENT_AMBULATORY_CARE_PROVIDER_SITE_OTHER): Payer: Medicare Other | Admitting: Cardiology

## 2013-11-21 VITALS — BP 118/64 | HR 58 | Ht 64.0 in | Wt 125.0 lb

## 2013-11-21 DIAGNOSIS — I359 Nonrheumatic aortic valve disorder, unspecified: Secondary | ICD-10-CM

## 2013-11-21 DIAGNOSIS — I495 Sick sinus syndrome: Secondary | ICD-10-CM

## 2013-11-21 DIAGNOSIS — I4510 Unspecified right bundle-branch block: Secondary | ICD-10-CM

## 2013-11-21 DIAGNOSIS — I10 Essential (primary) hypertension: Secondary | ICD-10-CM

## 2013-11-21 DIAGNOSIS — Z950 Presence of cardiac pacemaker: Secondary | ICD-10-CM

## 2013-11-21 DIAGNOSIS — I350 Nonrheumatic aortic (valve) stenosis: Secondary | ICD-10-CM

## 2013-11-21 LAB — MDC_IDC_ENUM_SESS_TYPE_INCLINIC
Battery Remaining Longevity: 124.8 mo
Battery Voltage: 2.95 V
Implantable Pulse Generator Serial Number: 7350973
Lead Channel Impedance Value: 387.5 Ohm
Lead Channel Pacing Threshold Amplitude: 0.75 V
Lead Channel Pacing Threshold Amplitude: 0.75 V
Lead Channel Pacing Threshold Pulse Width: 0.4 ms
Lead Channel Setting Pacing Amplitude: 1 V
Lead Channel Setting Pacing Amplitude: 1.75 V
Lead Channel Setting Pacing Pulse Width: 0.4 ms
Lead Channel Setting Sensing Sensitivity: 2 mV
MDC IDC MSMT LEADCHNL RA PACING THRESHOLD PULSEWIDTH: 0.4 ms
MDC IDC MSMT LEADCHNL RA SENSING INTR AMPL: 2.1 mV
MDC IDC MSMT LEADCHNL RV IMPEDANCE VALUE: 437.5 Ohm
MDC IDC MSMT LEADCHNL RV SENSING INTR AMPL: 12 mV
MDC IDC SESS DTM: 20150820093303
MDC IDC STAT BRADY RA PERCENT PACED: 5.5 %
MDC IDC STAT BRADY RV PERCENT PACED: 14 %

## 2013-11-21 NOTE — Patient Instructions (Signed)
Your physician recommends that you continue on your current medications as directed. Please refer to the Current Medication list given to you today.  Your physician wants you to follow-up in: 6 months with Dr Turner You will receive a reminder letter in the mail two months in advance. If you don't receive a letter, please call our office to schedule the follow-up appointment.  

## 2013-11-21 NOTE — Progress Notes (Signed)
Pacemaker check in clinic. Normal device function. Thresholds, sensing, impedances consistent with previous measurements. Device programmed to maximize longevity. Pt in AF 1.1% of time. Longest episode was 4 hours 23 minutes. + Plavix. No high ventricular rates noted. Device programmed at appropriate safety margins. Histogram distribution appropriate for patient activity level. Device programmed to optimize intrinsic conduction. Estimated longevity 9.7 to 10.4 years. Patient enrolled in remote follow-up but having problems with transmitter. Pt to send in transmission once home and call to troubleshoot. Merlin 02-19-14 and ROV in February with [facility].

## 2013-11-21 NOTE — Progress Notes (Signed)
[address] [place], [place]  [address] Phone: [phone] Fax:  [phone]) [phone]  Date:  11/21/2013   ID:  John NeighborsFritz Weaver, DOB [birth year], MRN [id number]  PCP:  Margaree MackintoshBAXLEY,[name], MD  Cardiologist:  Armanda Magicraci Turner, MD     History of Present Illness: John Weaver is a 80 y.o. male with a history of syncope in the setting of SSS and is s/p PPM. He also has a history of HTN, mild AS, mild AI, moderate TR and mild pulmonary HTN with PASP 42mmHg. He is doing well today. He denies any anginal chest pain, SOB, DOE, dizziness, palpitations or syncope.  He occasionally has some chest wall pain that is relieved with oxycodone.  He had a bout of cellulitis earlier in the summer that led to some LE edema which has been controlled with diuretics.    Wt Readings from Last 3 Encounters:  11/21/13 125 lb (56.7 kg)  10/17/13 130 lb (58.968 kg)  09/30/13 132 lb (59.875 kg)     Past Medical History  Diagnosis Date  . Back pain   . Hypertension   . Hypercholesteremia   . Hypothyroidism   . Stroke 02/2012    tia  . History of CHF (congestive heart failure)   . Hemorrhoids   . DDD (degenerative disc disease)     of the spine  . Kidney stones   . Nephritis     at age 80  . Bradycardia     with snycope  . SSS (sick sinus syndrome)     in setting of syncope s/p PPM  . CHF (congestive heart failure)   . Dysrhythmia   . GERD (gastroesophageal reflux disease)   . Cellulitis and abscess of leg 09/16/2013    RT LEG    Current Outpatient Prescriptions  Medication Sig Dispense Refill  . Ascorbic Acid (VITAMIN C) 1000 MG tablet Take 1,000 mg by mouth daily.      . clopidogrel (PLAVIX) 75 MG tablet Take 75 mg by mouth daily with breakfast.      . Diphenhydramine-APAP, sleep, (TYLENOL PM EXTRA STRENGTH PO) Take 2 tablets by mouth at bedtime as needed (for sleep).      Marland Kitchen. esomeprazole (NEXIUM) 40 MG capsule Take 1 capsule (40 mg total) by mouth daily.  30 capsule  3  . furosemide (LASIX) 20 MG  tablet TAKE ONE (1) TABLET BY MOUTH EVERY DAY  30 tablet  1  . HYDROcodone-acetaminophen (NORCO/VICODIN) 5-325 MG per tablet Take 1 tablet by mouth 3 (three) times daily.  60 tablet  0  . levothyroxine (SYNTHROID, LEVOTHROID) 100 MCG tablet TAKE ONE (1) TABLET BY MOUTH EVERY DAY  90 tablet  1  . lisinopril (PRINIVIL,ZESTRIL) 10 MG tablet Take 20 mg by mouth daily.      . Multiple Vitamins-Minerals (MULTIVITAMINS THER. W/MINERALS) TABS Take 1 tablet by mouth daily.      . mupirocin ointment (BACTROBAN) 2 % Apply 1 application topically 2 (two) times daily.  22 g  2  . PARoxetine (PAXIL) 10 MG tablet Take 1 tablet (10 mg total) by mouth daily.  30 tablet  1  . pravastatin (PRAVACHOL) 40 MG tablet Take 1 tablet (40 mg total) by mouth at bedtime.  90 tablet  3  . Sennosides (EX-LAX) 15 MG TABS Take 15 mg by mouth at bedtime as needed. For constipation      . tamsulosin (FLOMAX) 0.4 MG CAPS capsule Take 0.4 mg by mouth daily after supper.  No current facility-administered medications for this visit.    Allergies:    Allergies  Allergen Reactions  . Doxycycline     Swelling and more redness, made him dizzy, nauseous and had heavyness in head  . Sulfa Antibiotics Rash    Social History:  The patient  reports that he has quit smoking. His smoking use included Pipe. He has never used smokeless tobacco. He reports that he drinks about .6 ounces of alcohol per week. He reports that he does not use illicit drugs.   Family History:  The patient's family history includes Kidney disease in his father and mother.   ROS:  Please see the history of present illness.      All other systems reviewed and negative.   PHYSICAL EXAM: VS:  BP 118/64  Pulse 58  Ht 5\' 4"  (1.626 m)  Wt 125 lb (56.7 kg)  BMI 21.45 kg/m2 Well nourished, well developed, in no acute distress HEENT: normal Neck: no JVD Cardiac:  normal S1, S2; RRR; no murmur Lungs:  clear to auscultation bilaterally, no wheezing, rhonchi  or rales Abd: soft, nontender, no hepatomegaly Ext: trace RLE edema Skin: warm and dry Neuro:  CNs 2-12 intact, no focal abnormalities noted      ASSESSMENT/PLAN:  1. Syncope in setting of SSS with no reoccurence after PPM  2. SSS s/p PPM - pacer followed in our pacer clininc  3. HTN controlled  - continue Lisinopril  4. Mild AS asymptomatic  5. Mild pulmonary HTN  6. Right carotid artery bruit - no stenosis by doppler  Followup with me in 6 months   Signed, Armanda Magic, MD 11/21/2013 8:28 AM

## 2013-11-26 ENCOUNTER — Other Ambulatory Visit: Payer: Self-pay | Admitting: Internal Medicine

## 2013-11-28 ENCOUNTER — Telehealth: Payer: Self-pay | Admitting: Cardiology

## 2013-11-28 NOTE — Telephone Encounter (Signed)
New message ° ° ° ° ° °Returning a nurses call from a couple of days ago °

## 2013-11-29 NOTE — Telephone Encounter (Signed)
lmtrc

## 2013-11-29 NOTE — Telephone Encounter (Signed)
Spoke with wife and made aware of pacer check.

## 2013-12-09 ENCOUNTER — Encounter: Payer: Self-pay | Admitting: Internal Medicine

## 2013-12-26 ENCOUNTER — Other Ambulatory Visit: Payer: Self-pay | Admitting: Internal Medicine

## 2014-01-01 ENCOUNTER — Other Ambulatory Visit: Payer: Self-pay | Admitting: Internal Medicine

## 2014-01-01 DIAGNOSIS — R32 Unspecified urinary incontinence: Secondary | ICD-10-CM

## 2014-01-06 ENCOUNTER — Telehealth: Payer: Self-pay

## 2014-01-06 ENCOUNTER — Telehealth: Payer: Self-pay | Admitting: Internal Medicine

## 2014-01-06 ENCOUNTER — Other Ambulatory Visit (INDEPENDENT_AMBULATORY_CARE_PROVIDER_SITE_OTHER): Payer: Medicare Other | Admitting: Internal Medicine

## 2014-01-06 ENCOUNTER — Other Ambulatory Visit: Payer: Self-pay

## 2014-01-06 DIAGNOSIS — R350 Frequency of micturition: Secondary | ICD-10-CM

## 2014-01-06 DIAGNOSIS — R319 Hematuria, unspecified: Secondary | ICD-10-CM

## 2014-01-06 DIAGNOSIS — IMO0001 Reserved for inherently not codable concepts without codable children: Secondary | ICD-10-CM

## 2014-01-06 LAB — POCT URINALYSIS DIPSTICK
BILIRUBIN UA: NEGATIVE
GLUCOSE UA: NEGATIVE
KETONES UA: NEGATIVE
Nitrite, UA: NEGATIVE
Protein, UA: NEGATIVE
Spec Grav, UA: 1.01
UROBILINOGEN UA: NEGATIVE
pH, UA: 6

## 2014-01-06 MED ORDER — DOXYCYCLINE HYCLATE 100 MG PO TABS: 100.0000 mg | ORAL_TABLET | Freq: Two times a day (BID) | ORAL | Status: DC

## 2014-01-06 MED ORDER — PAROXETINE HCL 10 MG PO TABS: 10.0000 mg | ORAL_TABLET | Freq: Every day | ORAL | Status: AC

## 2014-01-06 NOTE — Telephone Encounter (Signed)
Hale Bogusorter informed of patients UTI and medication.  Doxycyline sent to [facility].  They are aware and the urine was sent to the lab for culture.

## 2014-01-06 NOTE — Telephone Encounter (Signed)
Patient's wife had called couple of weeks ago saying that she needed to speak with me. She would not tell front office staff what issue was with husband. She called recently and indicated he was having some issues with urinary incontinence. She thought it was made worse by diuretic. We have decided maybe to hold diuretic for a few days and see if symptoms improve. Wife will come by and get sterile urine cup to bring sample by to check for UTI. Appointment made with [facility] for October 20th.

## 2014-01-06 NOTE — Telephone Encounter (Signed)
Message copied by Judd GaudierLEVENS, Marek Nghiem M on Mon Jan 06, 2014 12:23 PM ------      Message from: Margaree MackintoshBAXLEY, [name]      Created: Mon Jan 06, 2014 12:01 PM       Please call Hale Bogusorter and tell her he may have a UTI. Culture will be done. Start Doxycycline 100 mg twice a day for 10 days. ------

## 2014-01-07 LAB — URINALYSIS, MICROSCOPIC ONLY
Bacteria, UA: NONE SEEN
Casts: NONE SEEN
Crystals: NONE SEEN
SQUAMOUS EPITHELIAL / LPF: NONE SEEN

## 2014-01-08 LAB — URINE CULTURE

## 2014-01-14 ENCOUNTER — Encounter: Payer: Self-pay | Admitting: Internal Medicine

## 2014-01-14 ENCOUNTER — Ambulatory Visit (INDEPENDENT_AMBULATORY_CARE_PROVIDER_SITE_OTHER): Payer: Medicare Other | Admitting: Internal Medicine

## 2014-01-14 VITALS — HR 85 | Wt 135.0 lb

## 2014-01-14 DIAGNOSIS — R32 Unspecified urinary incontinence: Secondary | ICD-10-CM

## 2014-01-14 DIAGNOSIS — N39490 Overflow incontinence: Secondary | ICD-10-CM

## 2014-01-14 DIAGNOSIS — M25512 Pain in left shoulder: Secondary | ICD-10-CM | POA: Diagnosis not present

## 2014-01-14 DIAGNOSIS — M19012 Primary osteoarthritis, left shoulder: Secondary | ICD-10-CM

## 2014-01-14 LAB — POCT URINALYSIS DIPSTICK
BILIRUBIN UA: NEGATIVE
Blood, UA: NEGATIVE
Glucose, UA: NEGATIVE
Ketones, UA: NEGATIVE
NITRITE UA: NEGATIVE
Protein, UA: NEGATIVE
Spec Grav, UA: 1.015
UROBILINOGEN UA: NEGATIVE
pH, UA: 6

## 2014-01-20 ENCOUNTER — Other Ambulatory Visit: Payer: Self-pay | Admitting: Internal Medicine

## 2014-02-04 ENCOUNTER — Ambulatory Visit (INDEPENDENT_AMBULATORY_CARE_PROVIDER_SITE_OTHER): Payer: Medicare Other | Admitting: Internal Medicine

## 2014-02-04 DIAGNOSIS — Z23 Encounter for immunization: Secondary | ICD-10-CM

## 2014-02-20 ENCOUNTER — Encounter: Payer: Self-pay | Admitting: Internal Medicine

## 2014-02-20 ENCOUNTER — Ambulatory Visit (INDEPENDENT_AMBULATORY_CARE_PROVIDER_SITE_OTHER): Payer: Medicare Other | Admitting: *Deleted

## 2014-02-20 DIAGNOSIS — I495 Sick sinus syndrome: Secondary | ICD-10-CM

## 2014-02-20 LAB — MDC_IDC_ENUM_SESS_TYPE_REMOTE
Battery Remaining Percentage: 74 %
Battery Voltage: 2.95 V
Brady Statistic AP VP Percent: 1 %
Brady Statistic RV Percent Paced: 8 %
Date Time Interrogation Session: 20151119143647
Implantable Pulse Generator Model: 2110
Implantable Pulse Generator Serial Number: 7350973
Lead Channel Impedance Value: 380 Ohm
Lead Channel Pacing Threshold Amplitude: 0.75 V
Lead Channel Pacing Threshold Pulse Width: 0.4 ms
Lead Channel Sensing Intrinsic Amplitude: 1.4 mV
MDC IDC MSMT BATTERY REMAINING LONGEVITY: 103 mo
MDC IDC MSMT LEADCHNL RV IMPEDANCE VALUE: 430 Ohm
MDC IDC MSMT LEADCHNL RV PACING THRESHOLD AMPLITUDE: 0.75 V
MDC IDC MSMT LEADCHNL RV PACING THRESHOLD PULSEWIDTH: 0.4 ms
MDC IDC MSMT LEADCHNL RV SENSING INTR AMPL: 10.5 mV
MDC IDC SET LEADCHNL RA PACING AMPLITUDE: 1.75 V
MDC IDC SET LEADCHNL RV PACING AMPLITUDE: 1 V
MDC IDC SET LEADCHNL RV PACING PULSEWIDTH: 0.4 ms
MDC IDC SET LEADCHNL RV SENSING SENSITIVITY: 2 mV
MDC IDC STAT BRADY AP VS PERCENT: 1.8 %
MDC IDC STAT BRADY AS VP PERCENT: 6.9 %
MDC IDC STAT BRADY AS VS PERCENT: 82 %
MDC IDC STAT BRADY RA PERCENT PACED: 2.4 %

## 2014-02-20 NOTE — Progress Notes (Signed)
Remote pacemaker transmission.   

## 2014-02-24 ENCOUNTER — Other Ambulatory Visit: Payer: Self-pay | Admitting: Internal Medicine

## 2014-02-26 ENCOUNTER — Encounter: Payer: Self-pay | Admitting: Cardiology

## 2014-03-02 DIAGNOSIS — R32 Unspecified urinary incontinence: Secondary | ICD-10-CM | POA: Insufficient documentation

## 2014-03-02 NOTE — Progress Notes (Signed)
   Subjective:    Patient ID: John Weaver, male    DOB: 1915/08/27, 80 y.o.   MRN: [id number]  HPI  In today to discuss issues with urinary incontinence. Wife thought it might of been related to diuretic therapy so she stopped that for a few days. His feet began to swell. Suggested we try diuretic every other day. She's been frustrated by incontinence issues. He was treated October 5 for 10 days with doxycycline because of incontinence issues. Urine culture grew 30,000 colonies per milliliter multiple species. Repeat urinalysis today shows 3+ LE. Not sure it was a clean catch.  He is also having issues with left shoulder pain.    Review of Systems     Objective:   Physical Exam Good range of motion in left upper extremity but pain with raising her arm up overhead. No significant crepitus. Urinalysis checked.       Assessment & Plan:  Urinary incontinence-likely overflow incontinence from BPH. To see urologist in the near future. He is on Flomax.  Left shoulder pain-likely osteoarthritis. Injected today with combination of Depo-Medrol, Marcaine and Xylocaine.  Plan: Return as needed.  25 minutes spent with patient

## 2014-03-02 NOTE — Patient Instructions (Signed)
Appointment see urologist regarding urinary incontinence. Cut back on diuretic to every other day to see if that helps. Left shoulder injected with combination of Depo-Medrol Marcaine and Xylocaine.

## 2014-03-13 ENCOUNTER — Encounter (HOSPITAL_COMMUNITY): Payer: Self-pay | Admitting: Internal Medicine

## 2014-03-25 ENCOUNTER — Other Ambulatory Visit: Payer: Self-pay | Admitting: Internal Medicine

## 2014-04-15 ENCOUNTER — Other Ambulatory Visit: Payer: Self-pay | Admitting: Internal Medicine

## 2014-04-16 ENCOUNTER — Ambulatory Visit
Admission: RE | Admit: 2014-04-16 | Discharge: 2014-04-16 | Disposition: A | Discharge status: Home / Self Care | Payer: Medicare Other | Source: Ambulatory Visit | Attending: Internal Medicine | Admitting: Internal Medicine

## 2014-04-16 ENCOUNTER — Ambulatory Visit (INDEPENDENT_AMBULATORY_CARE_PROVIDER_SITE_OTHER): Payer: Medicare Other | Admitting: Internal Medicine

## 2014-04-16 ENCOUNTER — Telehealth: Payer: Self-pay | Admitting: Internal Medicine

## 2014-04-16 ENCOUNTER — Encounter: Payer: Self-pay | Admitting: Internal Medicine

## 2014-04-16 VITALS — BP 138/84 | HR 73 | Temp 97.8°F

## 2014-04-16 DIAGNOSIS — R0602 Shortness of breath: Secondary | ICD-10-CM

## 2014-04-16 DIAGNOSIS — J069 Acute upper respiratory infection, unspecified: Secondary | ICD-10-CM

## 2014-04-16 MED ORDER — DOXYCYCLINE HYCLATE 100 MG PO TABS: 100.0000 mg | ORAL_TABLET | Freq: Two times a day (BID) | ORAL | Status: DC

## 2014-04-16 NOTE — Telephone Encounter (Signed)
Wife says he was short of breath last night and is congested this morning. We have given him an appointment for 11:30 this morning. He will have chest x-ray prior to coming to office.

## 2014-04-21 ENCOUNTER — Other Ambulatory Visit: Payer: Self-pay | Admitting: Internal Medicine

## 2014-04-22 ENCOUNTER — Telehealth: Payer: Self-pay | Admitting: *Deleted

## 2014-04-22 MED ORDER — OMEPRAZOLE 20 MG PO CPDR: 20.0000 mg | DELAYED_RELEASE_CAPSULE | Freq: Every day | ORAL | Status: AC

## 2014-04-22 NOTE — Telephone Encounter (Signed)
Patient Pharmacy called and states patient current Nexuim script is 80 dollars and patient cant afford it. Ok per Dr Lenord FellersBaxley to change patient to Prilosec 20mg  one PO daily

## 2014-04-24 ENCOUNTER — Encounter: Payer: Self-pay | Admitting: Internal Medicine

## 2014-04-24 NOTE — Patient Instructions (Signed)
Doxycycline 100 mg twice daily for 10 days

## 2014-04-24 NOTE — Progress Notes (Signed)
   Subjective:    Patient ID: John Weaver, male    DOB: [birth year], 81 y.o.   MRN: [id number]  HPI Wife brings him in today saying he's been short of breath and congested. Not sure if he has a respiratory infection or not. History of chronic left shoulder pain. Shoulder was injected fairly recently with of steroid preparation and that seemed to help a bit. Continues to have issues with poor eye sight.    Review of Systems     Objective:   Physical Exam  Neck is supple without JVD thyromegaly or adenopathy. No carotid bruits. Chest is clear to auscultation. Cardiac exam regular rate and rhythm. Extremities without edema      Assessment & Plan:  Probable upper respiratory infection  I do not see that he has congestive heart failure today  Left shoulder arthropathy  Plan: Doxycycline 100 mg twice daily for 10 days.

## 2014-04-28 ENCOUNTER — Telehealth: Payer: Self-pay | Admitting: Internal Medicine

## 2014-04-28 NOTE — Telephone Encounter (Signed)
She went to pick up his Nexium and it went from $40 copay to $80 copay.  Wants to know if there's any other alternative?  Please advise.   [phone]

## 2014-04-28 NOTE — Telephone Encounter (Signed)
Please call  Wife. Have addressed this with pharmacy

## 2014-04-28 NOTE — Telephone Encounter (Signed)
You spoke directly with pharmacist and there were very few options through insurance company as I understand it without large copays. Please call pharmacist again and ask about side effects of Prilosec and Plavix and explain why wife is concerned. Is Prilosec a reasonable alternative???

## 2014-04-28 NOTE — Telephone Encounter (Signed)
Spoke with patients wife she states she is concerned about him using prilosec because pharmacist told them it can lesson effects of his plavix Please advise

## 2014-04-29 MED ORDER — RANITIDINE HCL 150 MG PO TABS: 150.0000 mg | ORAL_TABLET | Freq: Two times a day (BID) | ORAL | Status: DC

## 2014-04-29 NOTE — Telephone Encounter (Signed)
Patient wife came into office this am she states patient does need to take something for his reflux. Per Dr Lenord FellersBaxley Zantac 150mg  Bid order sent to pharmacy

## 2014-05-12 ENCOUNTER — Other Ambulatory Visit: Payer: Self-pay | Admitting: Internal Medicine

## 2014-05-12 NOTE — Telephone Encounter (Signed)
Please refill.

## 2014-05-12 NOTE — Telephone Encounter (Signed)
Hydrocodone script printed for Dr [name] to sign 

## 2014-05-26 ENCOUNTER — Ambulatory Visit (INDEPENDENT_AMBULATORY_CARE_PROVIDER_SITE_OTHER): Payer: Medicare Other | Admitting: Cardiology

## 2014-05-26 ENCOUNTER — Encounter: Payer: Self-pay | Admitting: Cardiology

## 2014-05-26 VITALS — BP 132/68 | HR 74 | Ht 64.0 in | Wt 133.0 lb

## 2014-05-26 DIAGNOSIS — I350 Nonrheumatic aortic (valve) stenosis: Secondary | ICD-10-CM

## 2014-05-26 DIAGNOSIS — I495 Sick sinus syndrome: Secondary | ICD-10-CM

## 2014-05-26 DIAGNOSIS — Z950 Presence of cardiac pacemaker: Secondary | ICD-10-CM

## 2014-05-26 DIAGNOSIS — I4510 Unspecified right bundle-branch block: Secondary | ICD-10-CM

## 2014-05-26 DIAGNOSIS — I10 Essential (primary) hypertension: Secondary | ICD-10-CM

## 2014-05-26 LAB — BASIC METABOLIC PANEL
BUN: 52 mg/dL — ABNORMAL HIGH (ref 6–23)
CHLORIDE: 108 meq/L (ref 96–112)
CO2: 23 mEq/L (ref 19–32)
Calcium: 9.5 mg/dL (ref 8.4–10.5)
Creatinine, Ser: 2.14 mg/dL — ABNORMAL HIGH (ref 0.40–1.50)
GFR: 30.55 mL/min — AB (ref >60.00)
Glucose, Bld: 111 mg/dL — ABNORMAL HIGH (ref 70–99)
Potassium: 5.1 mEq/L (ref 3.5–5.1)
Sodium: 138 mEq/L (ref 135–145)

## 2014-05-26 NOTE — Progress Notes (Signed)
Cardiology Office Note   Date:  05/26/2014   ID:  Tryston Gilliam, DOB 1916-02-08, MRN [id number]  PCP:  Margaree Mackintosh, MD  Cardiologist:   Quintella Reichert, MD   Chief Complaint  Patient presents with  . Bradycardia  . Hypertension  . Aortic Stenosis      History of Present Illness:  John Weaver is a 81 y.o. male with a history of syncope in the setting of SSS and is s/p PPM. He also has a history of HTN, mild AS, mild AI, moderate TR and mild pulmonary HTN with PASP . He is doing well today. He denies any anginal chest pain, SOB, DOE, dizziness, palpitations or syncope. He has chronic LE edema which is stable.     Past Medical History  Diagnosis Date  . Back pain   . Hypertension   . Hypercholesteremia   . Hypothyroidism   . Stroke 02/2012    tia  . History of CHF (congestive heart failure)   . Hemorrhoids   . DDD (degenerative disc disease)     of the spine  . Kidney stones   . Nephritis     at age 75  . Bradycardia     with snycope  . SSS (sick sinus syndrome)     in setting of syncope s/p PPM  . CHF (congestive heart failure)   . Dysrhythmia   . GERD (gastroesophageal reflux disease)   . Cellulitis and abscess of leg 09/16/2013    RT LEG    Past Surgical History  Procedure Laterality Date  . Knee surgery      left knee  . Joint replacement      Left knee replacement  . Insert / replace / remove pacemaker    . Permanent pacemaker insertion N/A 10/03/2011    Procedure: PERMANENT PACEMAKER INSERTION;  Surgeon: Duke Salvia, MD;  Location: [facility] CATH LAB;  Service: Cardiovascular;  Laterality: N/A;  . Temporary pacemaker insertion N/A 10/03/2011    Procedure: TEMPORARY PACEMAKER INSERTION;  Surgeon: Quintella Reichert, MD;  Location: [facility] CATH LAB;  Service: Cardiovascular;  Laterality: N/A;     Current Outpatient Prescriptions  Medication Sig Dispense Refill  . Ascorbic Acid (VITAMIN C) 1000 MG tablet Take 1,000 mg by mouth daily.    . clopidogrel  (PLAVIX) 75 MG tablet TAKE ONE (1) TABLET BY MOUTH EVERY DAY 30 tablet 11  . Diphenhydramine-APAP, sleep, (TYLENOL PM EXTRA STRENGTH PO) Take 2 tablets by mouth at bedtime as needed (for sleep).    . furosemide (LASIX) 20 MG tablet TAKE ONE (1) TABLET BY MOUTH EVERY DAY 30 tablet 5  . HYDROcodone-acetaminophen (NORCO/VICODIN) 5-325 MG per tablet Take 1 tablet by mouth 3 (three) times daily as needed for moderate pain. 60 tablet 0  . levothyroxine (SYNTHROID, LEVOTHROID) 100 MCG tablet TAKE ONE (1) TABLET BY MOUTH EVERY DAY 90 tablet 2  . lisinopril (PRINIVIL,ZESTRIL) 10 MG tablet Take 20 mg by mouth daily.    . Multiple Vitamins-Minerals (MULTIVITAMINS THER. W/MINERALS) TABS Take 1 tablet by mouth daily.    . mupirocin ointment (BACTROBAN) 2 % Apply 1 application topically 2 (two) times daily. 22 g 2  . omeprazole (PRILOSEC) 20 MG capsule Take 1 capsule (20 mg total) by mouth daily. 90 capsule 1  . PARoxetine (PAXIL) 10 MG tablet Take 1 tablet (10 mg total) by mouth daily. 30 tablet 11  . pravastatin (PRAVACHOL) 40 MG tablet Take 40 mg by mouth daily.    Marland Kitchen  ranitidine (ZANTAC) 150 MG tablet Take 1 tablet (150 mg total) by mouth 2 (two) times daily. 60 tablet 3  . Sennosides (EX-LAX) 15 MG TABS Take 15 mg by mouth at bedtime as needed. For constipation    . tamsulosin (FLOMAX) 0.4 MG CAPS capsule TAKE ONE CAPSULE BY MOUTH DAILY 30 capsule 5  . triamcinolone cream (KENALOG) 0.1 % Apply 1 application topically daily as needed (itching).      No current facility-administered medications for this visit.    Allergies:   Sulfa antibiotics    Social History:  The patient  reports that he has quit smoking. His smoking use included Pipe. He has never used smokeless tobacco. He reports that he drinks about 0.6 oz of alcohol per week. He reports that he does not use illicit drugs.   Family History:  The patient's family history includes Kidney disease in his father and mother.    ROS:  Please see the  history of present illness.   Otherwise, review of systems are positive for none.   All other systems are reviewed and negative.    PHYSICAL EXAM: VS:  BP 132/68 mmHg  Pulse 74  Ht 5\' 4"  (1.626 m)  Wt 133 lb (60.328 kg)  BMI 22.82 kg/m2  SpO2 99% , BMI Body mass index is 22.82 kg/(m^2). GEN: Well nourished, well developed, in no acute distress HEENT: normal Neck: no JVD, carotid bruits, or masses Cardiac: RRR; no murmurs, rubs, or gallops,no edema  Respiratory:  clear to auscultation bilaterally, normal work of breathing GI: soft, nontender, nondistended, + BS MS: no deformity or atrophy Skin: warm and dry, no rash Neuro:  Strength and sensation are intact Psych: euthymic mood, full affect   EKG:  EKG is not ordered today.    Recent Labs: 07/30/2013: TSH 2.766 09/16/2013: ALT 20; Hemoglobin 11.5*; Platelets 214; Pro B Natriuretic peptide (BNP) 1016.0* 10/24/2013: BUN 48*; Creatinine 1.55*; Potassium 5.3; Sodium 137    Lipid Panel    Component Value Date/Time   CHOL 145 07/30/2013 0907   TRIG 71 07/30/2013 0907   HDL 55 07/30/2013 0907   CHOLHDL 2.6 07/30/2013 0907   VLDL 14 07/30/2013 0907   LDLCALC 76 07/30/2013 0907      Wt Readings from Last 3 Encounters:  05/26/14 133 lb (60.328 kg)  01/14/14 135 lb (61.236 kg)  11/21/13 125 lb (56.7 kg)     ASSESSMENT/PLAN:  1. Syncope in setting of SSS with no reoccurence after PPM  2. SSS s/p PPM - pacer followed in our pacer clininc  3. HTN controlled  - continue Lisinopril  - check BMET 4. Mild AS asymptomatic  5. Mild pulmonary HTN  6. Right carotid artery bruit - no stenosis by doppler    Current medicines are reviewed at length with the patient today.  The patient does not have concerns regarding medicines.  The following changes have been made:  no change  Labs/ tests ordered today include: BMET     Disposition:   FU with me in 6 months   Signed, Quintella ReichertURNER,TRACI R, MD  05/26/2014 9:26 AM    [facility] [address], [place], [place]  [address] Phone: [phone](336) [phone]; Fax: [phone]

## 2014-05-26 NOTE — Patient Instructions (Signed)
Your physician recommends that you continue on your current medications as directed. Please refer to the Current Medication list given to you today.  Your physician recommends that you have lab work TODAY (BMET).  Your physician wants you to follow-up in: 6 months with Dr. Turner. You will receive a reminder letter in the mail two months in advance. If you don't receive a letter, please call our office to schedule the follow-up appointment.  

## 2014-05-27 ENCOUNTER — Other Ambulatory Visit: Payer: Self-pay | Admitting: *Deleted

## 2014-05-27 ENCOUNTER — Other Ambulatory Visit: Payer: Self-pay | Admitting: Internal Medicine

## 2014-05-27 DIAGNOSIS — I10 Essential (primary) hypertension: Secondary | ICD-10-CM

## 2014-05-28 ENCOUNTER — Telehealth: Payer: Self-pay | Admitting: Cardiology

## 2014-05-28 NOTE — Telephone Encounter (Signed)
Follow up:    Pt's wife needs Rn to call back with lab results.   Pt did not understand and can not hear very well on the phone.  Thank you!

## 2014-05-28 NOTE — Telephone Encounter (Signed)
With patient's permission, explained to his wife that he is to STOP LASIX and have repeat lab work next Tuesday. Wife is irritated as to why we need permission as she is MPOA. Explained to her that by law, we have to have paper work signed at the office giving permission to speak to her. She agrees.

## 2014-06-03 ENCOUNTER — Other Ambulatory Visit (INDEPENDENT_AMBULATORY_CARE_PROVIDER_SITE_OTHER): Payer: Medicare Other | Admitting: *Deleted

## 2014-06-03 DIAGNOSIS — I10 Essential (primary) hypertension: Secondary | ICD-10-CM

## 2014-06-03 LAB — BASIC METABOLIC PANEL
BUN: 63 mg/dL — ABNORMAL HIGH (ref 6–23)
CO2: 23 meq/L (ref 19–32)
Calcium: 9.6 mg/dL (ref 8.4–10.5)
Chloride: 112 mEq/L (ref 96–112)
Creatinine, Ser: 2.03 mg/dL — ABNORMAL HIGH (ref 0.40–1.50)
GFR: 32.47 mL/min — AB (ref >60.00)
Glucose, Bld: 87 mg/dL (ref 70–99)
POTASSIUM: 5.2 meq/L — AB (ref 3.5–5.1)
SODIUM: 140 meq/L (ref 135–145)

## 2014-06-04 ENCOUNTER — Telehealth: Payer: Self-pay

## 2014-06-04 DIAGNOSIS — I10 Essential (primary) hypertension: Secondary | ICD-10-CM

## 2014-06-04 MED ORDER — AMLODIPINE BESYLATE 5 MG PO TABS: 5.0000 mg | ORAL_TABLET | Freq: Every day | ORAL | Status: DC

## 2014-06-04 NOTE — Telephone Encounter (Signed)
-----   Message from Quintella Reichertraci R Turner, MD sent at 06/03/2014  3:54 PM EST ----- Potassium and creatinine up - please have patient stop lisinopril and start Amlodipine 5mg  daily.  Recheck BMET in 1 week and BP in 1 week with nurse

## 2014-06-04 NOTE — Telephone Encounter (Signed)
Instructed patient's wife to STOP LISINOPRIL and START AMLODIPINE 5 mg daily. Repeat BMET and BP nurse visit scheduled for next Thursday.

## 2014-06-06 ENCOUNTER — Encounter (HOSPITAL_COMMUNITY): Payer: Self-pay | Admitting: Emergency Medicine

## 2014-06-06 ENCOUNTER — Emergency Department (HOSPITAL_COMMUNITY): Payer: Medicare Other

## 2014-06-06 ENCOUNTER — Emergency Department (HOSPITAL_COMMUNITY)
Admission: EM | Admit: 2014-06-06 | Discharge: 2014-06-06 | Disposition: A | Discharge status: Home / Self Care | Payer: Medicare Other | Attending: Emergency Medicine | Admitting: Emergency Medicine

## 2014-06-06 DIAGNOSIS — I10 Essential (primary) hypertension: Secondary | ICD-10-CM | POA: Diagnosis not present

## 2014-06-06 DIAGNOSIS — Z8739 Personal history of other diseases of the musculoskeletal system and connective tissue: Secondary | ICD-10-CM | POA: Insufficient documentation

## 2014-06-06 DIAGNOSIS — Z79899 Other long term (current) drug therapy: Secondary | ICD-10-CM | POA: Diagnosis not present

## 2014-06-06 DIAGNOSIS — K219 Gastro-esophageal reflux disease without esophagitis: Secondary | ICD-10-CM | POA: Insufficient documentation

## 2014-06-06 DIAGNOSIS — Z8673 Personal history of transient ischemic attack (TIA), and cerebral infarction without residual deficits: Secondary | ICD-10-CM | POA: Insufficient documentation

## 2014-06-06 DIAGNOSIS — E039 Hypothyroidism, unspecified: Secondary | ICD-10-CM | POA: Insufficient documentation

## 2014-06-06 DIAGNOSIS — Z7902 Long term (current) use of antithrombotics/antiplatelets: Secondary | ICD-10-CM | POA: Diagnosis not present

## 2014-06-06 DIAGNOSIS — Z792 Long term (current) use of antibiotics: Secondary | ICD-10-CM | POA: Diagnosis not present

## 2014-06-06 DIAGNOSIS — N289 Disorder of kidney and ureter, unspecified: Secondary | ICD-10-CM

## 2014-06-06 DIAGNOSIS — Z87891 Personal history of nicotine dependence: Secondary | ICD-10-CM | POA: Diagnosis not present

## 2014-06-06 DIAGNOSIS — Z87442 Personal history of urinary calculi: Secondary | ICD-10-CM | POA: Diagnosis not present

## 2014-06-06 DIAGNOSIS — R251 Tremor, unspecified: Secondary | ICD-10-CM | POA: Diagnosis not present

## 2014-06-06 DIAGNOSIS — I509 Heart failure, unspecified: Secondary | ICD-10-CM | POA: Insufficient documentation

## 2014-06-06 DIAGNOSIS — R011 Cardiac murmur, unspecified: Secondary | ICD-10-CM | POA: Diagnosis not present

## 2014-06-06 DIAGNOSIS — Z872 Personal history of diseases of the skin and subcutaneous tissue: Secondary | ICD-10-CM | POA: Diagnosis not present

## 2014-06-06 DIAGNOSIS — R6883 Chills (without fever): Secondary | ICD-10-CM

## 2014-06-06 DIAGNOSIS — Z950 Presence of cardiac pacemaker: Secondary | ICD-10-CM | POA: Diagnosis not present

## 2014-06-06 DIAGNOSIS — E780 Pure hypercholesterolemia: Secondary | ICD-10-CM | POA: Diagnosis not present

## 2014-06-06 HISTORY — DX: Unspecified macular degeneration: H35.30

## 2014-06-06 LAB — URINALYSIS, ROUTINE W REFLEX MICROSCOPIC
Bilirubin Urine: NEGATIVE
Glucose, UA: NEGATIVE mg/dL
Hgb urine dipstick: NEGATIVE
Ketones, ur: NEGATIVE mg/dL
Nitrite: NEGATIVE
Protein, ur: NEGATIVE mg/dL
Specific Gravity, Urine: 1.015 (ref 1.005–1.030)
Urobilinogen, UA: 0.2 mg/dL (ref 0.0–1.0)
pH: 6 (ref 5.0–8.0)

## 2014-06-06 LAB — BASIC METABOLIC PANEL
Anion gap: 8 (ref 5–15)
BUN: 64 mg/dL — ABNORMAL HIGH (ref 6–23)
CO2: 17 mmol/L — ABNORMAL LOW (ref 19–32)
Calcium: 9.2 mg/dL (ref 8.4–10.5)
Chloride: 116 mmol/L — ABNORMAL HIGH (ref 96–112)
Creatinine, Ser: 2.12 mg/dL — ABNORMAL HIGH (ref 0.50–1.35)
GFR calc Af Amer: 29 mL/min — ABNORMAL LOW (ref >90)
GFR calc non Af Amer: 25 mL/min — ABNORMAL LOW (ref >90)
Glucose, Bld: 99 mg/dL (ref 70–99)
Potassium: 5.2 mmol/L — ABNORMAL HIGH (ref 3.5–5.1)
Sodium: 141 mmol/L (ref 135–145)

## 2014-06-06 LAB — CBC WITH DIFFERENTIAL/PLATELET
Basophils Absolute: 0 10*3/uL (ref 0.0–0.1)
Basophils Relative: 0 % (ref 0–1)
Eosinophils Absolute: 0.1 10*3/uL (ref 0.0–0.7)
Eosinophils Relative: 2 % (ref 0–5)
HCT: 27.9 % — ABNORMAL LOW (ref 39.0–52.0)
Hemoglobin: 9.4 g/dL — ABNORMAL LOW (ref 13.0–17.0)
Lymphocytes Relative: 21 % (ref 12–46)
Lymphs Abs: 1.6 10*3/uL (ref 0.7–4.0)
MCH: 34.7 pg — ABNORMAL HIGH (ref 26.0–34.0)
MCHC: 33.7 g/dL (ref 30.0–36.0)
MCV: 103 fL — ABNORMAL HIGH (ref 78.0–100.0)
Monocytes Absolute: 0.5 10*3/uL (ref 0.1–1.0)
Monocytes Relative: 7 % (ref 3–12)
Neutro Abs: 5.1 10*3/uL (ref 1.7–7.7)
Neutrophils Relative %: 70 % (ref 43–77)
Platelets: 205 10*3/uL (ref 150–400)
RBC: 2.71 MIL/uL — ABNORMAL LOW (ref 4.22–5.81)
RDW: 12.7 % (ref 11.5–15.5)
WBC: 7.4 10*3/uL (ref 4.0–10.5)

## 2014-06-06 LAB — URINE MICROSCOPIC-ADD ON

## 2014-06-06 LAB — LACTIC ACID, PLASMA: Lactic Acid, Venous: 1 mmol/L (ref 0.5–2.0)

## 2014-06-06 MED ORDER — SODIUM CHLORIDE 0.9 % IV BOLUS (SEPSIS)
1000.0000 mL | Freq: Once | INTRAVENOUS | Status: AC
  Administered 2014-06-06: 1000 mL via INTRAVENOUS

## 2014-06-06 NOTE — ED Notes (Signed)
Pt c/o acute violent shivering. Pt normally slightly cold natured and has 3 shirts on but seems to be worse this morning. Pt was getting ready to go to other home in [place] but pt told wife that he would die in the car on the way there. Pt is cold to touch, denies being outside this morning. EKG shows paced rhythm. Left pupil is larger than right pupil, right pupil is pin point. Denies any pain.

## 2014-06-06 NOTE — ED Notes (Signed)
Patient transported to X-ray 

## 2014-06-06 NOTE — ED Notes (Signed)
Pt wife at bedside stated that pt looked like he was having a seizure like her mother use to have except no eye rolling in the back of head.

## 2014-06-06 NOTE — ED Provider Notes (Signed)
CSN: [id number]     Arrival date & time 06/06/14  1102 History   None    No chief complaint on file.    (Consider location/radiation/quality/duration/timing/severity/associated sxs/prior Treatment) HPI    81yM brought in by wife after having episode of uncontrollable shaking earlier today. Pt called out to her from another room shortly before arrival. Pt was shaking all over to point of teeth chattering. Pt was alert, answering questions through out this episode.  Pt is generally cold natured and typically wears several layers of clothes, but being chilled to this degree is unusual for him. No other complaints. No repeat episodes. In usual state of health over last several days. Lasix/ACEI stopped because of noted worsening renal function. No other med changes. No recorded fever. Denies any pain. No n/v. Incontinent of urine last night, but this is not unusual for him. No urinary complaints otherwise.   Past Medical History  Diagnosis Date  . Back pain   . Hypertension   . Hypercholesteremia   . Hypothyroidism   . Stroke 02/2012    tia  . History of CHF (congestive heart failure)   . Hemorrhoids   . DDD (degenerative disc disease)     of the spine  . Kidney stones   . Nephritis     at age 81  . Bradycardia     with snycope  . SSS (sick sinus syndrome)     in setting of syncope s/p PPM  . CHF (congestive heart failure)   . Dysrhythmia   . GERD (gastroesophageal reflux disease)   . Cellulitis and abscess of leg 09/16/2013    RT LEG   Past Surgical History  Procedure Laterality Date  . Knee surgery      left knee  . Joint replacement      Left knee replacement  . Insert / replace / remove pacemaker    . Permanent pacemaker insertion N/A 10/03/2011    Procedure: PERMANENT PACEMAKER INSERTION;  Surgeon: Duke SalviaSteven C Klein, MD;  [facility];  Service: Cardiovascular;  Laterality: N/A;  . Temporary pacemaker insertion N/A 10/03/2011    Procedure: TEMPORARY PACEMAKER  INSERTION;  Surgeon: Quintella Reichertraci R Turner, MD;  Location: [facility] CATH LAB;  Service: Cardiovascular;  Laterality: N/A;   Family History  Problem Relation Age of Onset  . Kidney disease Mother   . Kidney disease Father    History  Substance Use Topics  . Smoking status: Former Smoker    Types: Pipe  . Smokeless tobacco: Never Used  . Alcohol Use: 0.6 oz/week    1 Glasses of wine per week    Review of Systems  All systems reviewed and negative, other than as noted in HPI.   Allergies  Sulfa antibiotics  Home Medications   Prior to Admission medications   Medication Sig Start Date End Date Taking? Authorizing Provider  amLODipine (NORVASC) 5 MG tablet Take 1 tablet (5 mg total) by mouth daily. 06/04/14   Quintella Reichertraci R Turner, MD  Ascorbic Acid (VITAMIN C) 1000 MG tablet Take 1,000 mg by mouth daily.    Historical Provider, MD  clopidogrel (PLAVIX) 75 MG tablet TAKE ONE (1) TABLET BY MOUTH EVERY DAY 04/15/14   Margaree MackintoshMary J [name], MD  Diphenhydramine-APAP, sleep, (TYLENOL PM EXTRA STRENGTH PO) Take 2 tablets by mouth at bedtime as needed (for sleep).    Historical Provider, MD  HYDROcodone-acetaminophen (NORCO/VICODIN) 5-325 MG per tablet Take 1 tablet by mouth 3 (three) times daily as needed for  moderate pain. 05/12/14   Margaree Mackintosh, MD  levothyroxine (SYNTHROID, LEVOTHROID) 100 MCG tablet TAKE ONE (1) TABLET BY MOUTH EVERY DAY 04/21/14   Margaree Mackintosh, MD  Multiple Vitamins-Minerals (MULTIVITAMINS THER. W/MINERALS) TABS Take 1 tablet by mouth daily.    Historical Provider, MD  mupirocin ointment (BACTROBAN) 2 % Apply 1 application topically 2 (two) times daily. 09/30/13   Margaree Mackintosh, MD  omeprazole (PRILOSEC) 20 MG capsule Take 1 capsule (20 mg total) by mouth daily. 04/22/14   Margaree Mackintosh, MD  PARoxetine (PAXIL) 10 MG tablet Take 1 tablet (10 mg total) by mouth daily. 01/06/14   Margaree Mackintosh, MD  pravastatin (PRAVACHOL) 40 MG tablet Take 40 mg by mouth daily.    Historical Provider, MD  ranitidine  (ZANTAC) 150 MG tablet Take 1 tablet (150 mg total) by mouth 2 (two) times daily. 04/29/14   Margaree Mackintosh, MD  Sennosides (EX-LAX) 15 MG TABS Take 15 mg by mouth at bedtime as needed. For constipation    Historical Provider, MD  tamsulosin (FLOMAX) 0.4 MG CAPS capsule TAKE ONE CAPSULE BY MOUTH DAILY 05/27/14   Margaree Mackintosh, MD  triamcinolone cream (KENALOG) 0.1 % Apply 1 application topically daily as needed (itching).  11/13/13   Historical Provider, MD   There were no vitals taken for this visit. Physical Exam  Constitutional: He appears well-developed and well-nourished. No distress.  HENT:  Head: Normocephalic and atraumatic.  Eyes: Conjunctivae are normal. Right eye exhibits no discharge. Left eye exhibits no discharge.  Pupils: 2mm OD, 4mm OS. Wife reports chronic.   Neck: Neck supple.  Cardiovascular: Normal rate and regular rhythm.  Exam reveals no gallop and no friction rub.   Murmur heard. Systolic murmur  Pulmonary/Chest: Effort normal and breath sounds normal. No respiratory distress.  Abdominal: Soft. He exhibits no distension. There is no tenderness.  Musculoskeletal: He exhibits no edema or tenderness.  Neurological: He is alert.  Alert. Follows commands. Joking around. Hard of hearing, CN otherwise intact. Strength 5/5 b/l u/l extremities.   Skin: Skin is warm and dry.  Psychiatric: He has a normal mood and affect. His behavior is normal. Thought content normal.  Nursing note and vitals reviewed.   ED Course  Procedures (including critical care time) Labs Review Labs Reviewed  URINALYSIS, ROUTINE W REFLEX MICROSCOPIC - Abnormal; Notable for the following:    Leukocytes, UA SMALL (*)    All other components within normal limits  BASIC METABOLIC PANEL - Abnormal; Notable for the following:    Potassium 5.2 (*)    Chloride 116 (*)    CO2 17 (*)    BUN 64 (*)    Creatinine, Ser 2.12 (*)    GFR calc non Af Amer 25 (*)    GFR calc Af Amer 29 (*)    All other  components within normal limits  CBC WITH DIFFERENTIAL/PLATELET - Abnormal; Notable for the following:    RBC 2.71 (*)    Hemoglobin 9.4 (*)    HCT 27.9 (*)    MCV 103.0 (*)    MCH 34.7 (*)    All other components within normal limits  LACTIC ACID, PLASMA  URINE MICROSCOPIC-ADD ON  LACTIC ACID, PLASMA    Imaging Review Dg Chest 2 View  06/06/2014   CLINICAL DATA:  Chills; hypertension  EXAM: CHEST  2 VIEW  COMPARISON:  April 16, 2014  FINDINGS: There is a calcified granuloma in the left lower lobe. There  is no edema or consolidation. Heart size and pulmonary vascularity are within normal limits. Pacemaker leads are attached to the right atrium and right ventricle. No pneumothorax. No adenopathy. There is atherosclerotic change in the aorta. There is thoracolumbar levoscoliosis. Bones are diffusely osteoporotic. There is extensive arthropathy in the left shoulder.  IMPRESSION: No edema or consolidation. Granuloma left lower lobe. No change in cardiac silhouette. Bones osteoporotic.   Electronically Signed   By: Bretta Bang III M.D.   On: 06/06/2014 13:27   Ct Head Wo Contrast  06/06/2014   CLINICAL DATA:  Altered mental status.  Initial encounter.  EXAM: CT HEAD WITHOUT CONTRAST  TECHNIQUE: Contiguous axial images were obtained from the base of the skull through the vertex without intravenous contrast.  COMPARISON:  02/29/2012.  FINDINGS: No mass lesion, mass effect, midline shift, hydrocephalus, hemorrhage. No acute territorial cortical ischemia/infarct. Atrophy and chronic ischemic white matter disease is present. Calvarium intact. LEFT-sided nasal septal spur. Intracranial atherosclerosis.  IMPRESSION: Atrophy and chronic ischemic white matter disease without an acute intracranial abnormality.   Electronically Signed   By: Andreas Newport M.D.   On: 06/06/2014 12:50     EKG Interpretation None      MDM   Final diagnoses:  Shaking chills  Renal impairment    89yM with what,  from wife's description, sounds like possible rigors earlier today. Not likely seizure with generalized shaking but maintaining awareness.  Now no complaints. Afebrile. No obvious source based on work-up, exam or history to suggest source if indeed an infectious process. Noted kidney function. Wife reports just recently instructed to stop ACEI and furosemide. BMP fairly stable from 3d/11d ago aside from mild acidosis. No gap. Normal lactic acid. Anemia. Drop from previous but last CBC 9 months ago. No overt bleeding. No blood thinners. Pt with no further complaints after almost five hours in the ED. I do not have exact explanation for his symptoms.  I feel admission for observation in an almost 81 year old potentially with rigors, his kidney function, anemia, etc is reasonable. Pt/wife would like to go home. I think this is reasonable too assuming close follow-up and low threshold to seek re-evaluation. Wife reports appointment with Dr Mayford Knife this upcoming Thursday. Planning on going to [place] to other home, but wife says will now postpone it. Strict return precautions, but will discharge     Raeford Razor, MD 06/06/14 1650

## 2014-06-10 ENCOUNTER — Telehealth (INDEPENDENT_AMBULATORY_CARE_PROVIDER_SITE_OTHER): Payer: Medicare Other | Admitting: *Deleted

## 2014-06-10 DIAGNOSIS — Z0000 Encounter for general adult medical examination without abnormal findings: Secondary | ICD-10-CM

## 2014-06-10 DIAGNOSIS — R8290 Unspecified abnormal findings in urine: Secondary | ICD-10-CM

## 2014-06-11 ENCOUNTER — Telehealth: Payer: Self-pay | Admitting: *Deleted

## 2014-06-11 LAB — POCT URINALYSIS DIPSTICK
Bilirubin, UA: NEGATIVE
GLUCOSE UA: NEGATIVE
Ketones, UA: NEGATIVE
Nitrite, UA: NEGATIVE
Protein, UA: NEGATIVE
RBC UA: NEGATIVE
Spec Grav, UA: 1.015
UROBILINOGEN UA: NEGATIVE
pH, UA: 6

## 2014-06-11 MED ORDER — DOXYCYCLINE HYCLATE 100 MG PO TABS: 100.0000 mg | ORAL_TABLET | Freq: Two times a day (BID) | ORAL | Status: DC

## 2014-06-11 NOTE — Telephone Encounter (Signed)
Ptient wife dropped off urine sample. Urine dip showed moderate Leukocytes . Per protocol urine sent for urine culture.

## 2014-06-12 ENCOUNTER — Ambulatory Visit (INDEPENDENT_AMBULATORY_CARE_PROVIDER_SITE_OTHER): Payer: Medicare Other

## 2014-06-12 ENCOUNTER — Other Ambulatory Visit (INDEPENDENT_AMBULATORY_CARE_PROVIDER_SITE_OTHER): Payer: Medicare Other | Admitting: *Deleted

## 2014-06-12 VITALS — BP 142/70 | HR 91 | Resp 18 | Wt 137.0 lb

## 2014-06-12 DIAGNOSIS — I10 Essential (primary) hypertension: Secondary | ICD-10-CM

## 2014-06-12 LAB — BASIC METABOLIC PANEL
BUN: 43 mg/dL — ABNORMAL HIGH (ref 6–23)
CALCIUM: 9.4 mg/dL (ref 8.4–10.5)
CO2: 22 mEq/L (ref 19–32)
CREATININE: 1.7 mg/dL — AB (ref 0.40–1.50)
Chloride: 114 mEq/L — ABNORMAL HIGH (ref 96–112)
GFR: 39.84 mL/min — AB (ref >60.00)
Glucose, Bld: 148 mg/dL — ABNORMAL HIGH (ref 70–99)
Potassium: 4.4 mEq/L (ref 3.5–5.1)
SODIUM: 140 meq/L (ref 135–145)

## 2014-06-12 NOTE — Telephone Encounter (Signed)
Patient to start Doxycycline 100mg  BID x 10 days wife given instructions

## 2014-06-12 NOTE — Patient Instructions (Signed)
No changes at this time.

## 2014-06-12 NOTE — Progress Notes (Signed)
1.) Reason for visit: BP check and BMET  2.) Name of MD requesting visit: Dr. Mayford Knifeurner  3.) H&P:  Patient is a 81 year old who recently had a medication change. Lisinopril was stopped and amlodipine 5 mg started. Patient's last labs on 06/03/2014 showed elevated potassium at 5.2, Creatinine 2.03 and BUN 63. Patient is ambulating slowly but steady with a cane. Patient is HOH and is alert and oriented times two. Vital signs were BP 142/70, HR 91, Resp. 18, and O2 95%. Patient had some audible wheezing, but did not appear in distress.   4.) ROS related to problem:  Patient is ambulating slowly but steady with a cane. Patient is HOH and is alert and oriented times two. Vital signs were BP 142/70, HR 91, Resp. 18, and O2 95%. Patient had some audible wheezing, but did not appear in distress.    5.) Assessment and plan per MD: Dr. Mayford Knifeurner reviewed vital signs and will await BMET for any further instructions. No new orders at this time.

## 2014-06-14 LAB — URINE CULTURE

## 2014-06-16 ENCOUNTER — Telehealth: Payer: Self-pay | Admitting: *Deleted

## 2014-06-16 MED ORDER — LEVOFLOXACIN 250 MG PO TABS: 250.0000 mg | ORAL_TABLET | Freq: Every day | ORAL | Status: DC

## 2014-06-16 NOTE — Telephone Encounter (Signed)
Spoke with patient wife regarding results and given instructions for medication use

## 2014-06-17 ENCOUNTER — Ambulatory Visit (INDEPENDENT_AMBULATORY_CARE_PROVIDER_SITE_OTHER): Payer: Medicare Other | Admitting: Internal Medicine

## 2014-06-17 ENCOUNTER — Encounter: Payer: Self-pay | Admitting: Internal Medicine

## 2014-06-17 VITALS — BP 124/54 | HR 74 | Ht 64.0 in | Wt 139.4 lb

## 2014-06-17 DIAGNOSIS — I350 Nonrheumatic aortic (valve) stenosis: Secondary | ICD-10-CM

## 2014-06-17 DIAGNOSIS — Z950 Presence of cardiac pacemaker: Secondary | ICD-10-CM

## 2014-06-17 DIAGNOSIS — I495 Sick sinus syndrome: Secondary | ICD-10-CM

## 2014-06-17 DIAGNOSIS — I10 Essential (primary) hypertension: Secondary | ICD-10-CM

## 2014-06-17 DIAGNOSIS — I480 Paroxysmal atrial fibrillation: Secondary | ICD-10-CM | POA: Diagnosis not present

## 2014-06-17 DIAGNOSIS — I4891 Unspecified atrial fibrillation: Secondary | ICD-10-CM | POA: Insufficient documentation

## 2014-06-17 LAB — MDC_IDC_ENUM_SESS_TYPE_INCLINIC
Battery Remaining Longevity: 129.6 mo
Battery Voltage: 2.95 V
Brady Statistic RA Percent Paced: 2.9 %
Brady Statistic RV Percent Paced: 9.4 %
Date Time Interrogation Session: 20160315123409
Lead Channel Impedance Value: 387.5 Ohm
Lead Channel Pacing Threshold Amplitude: 0.75 V
Lead Channel Sensing Intrinsic Amplitude: 1.8 mV
Lead Channel Setting Pacing Amplitude: 1.125
Lead Channel Setting Pacing Pulse Width: 0.4 ms
MDC IDC MSMT LEADCHNL RA IMPEDANCE VALUE: 362.5 Ohm
MDC IDC MSMT LEADCHNL RA PACING THRESHOLD PULSEWIDTH: 0.4 ms
MDC IDC MSMT LEADCHNL RV PACING THRESHOLD AMPLITUDE: 0.875 V
MDC IDC MSMT LEADCHNL RV PACING THRESHOLD PULSEWIDTH: 0.4 ms
MDC IDC MSMT LEADCHNL RV SENSING INTR AMPL: 12 mV
MDC IDC PG SERIAL: 7350973
MDC IDC SET LEADCHNL RA PACING AMPLITUDE: 1.75 V
MDC IDC SET LEADCHNL RV SENSING SENSITIVITY: 2 mV

## 2014-06-17 NOTE — Progress Notes (Addendum)
HPI John Weaver returns today for ongoing evaluation and management of his PPM. He is a very pleasant elderly man with a h/o symptomatic bradycardia, s/p PPM insertion. He has aortic stenosis, HTN, and dyslipidemia. He has chronic class 3 renal insufficiency. He has had some problem with falling in the interim, though he has not seriously injured himself. He denies chest pain or sob. He was recently treated for a UTI. Allergies  Allergen Reactions  . Sulfa Antibiotics Rash     Current Outpatient Prescriptions  Medication Sig Dispense Refill  . amLODipine (NORVASC) 5 MG tablet Take 1 tablet (5 mg total) by mouth daily. 30 tablet 6  . Ascorbic Acid (VITAMIN C) 1000 MG tablet Take 1,000 mg by mouth daily.    . clopidogrel (PLAVIX) 75 MG tablet TAKE ONE (1) TABLET BY MOUTH EVERY DAY 30 tablet 11  . Diphenhydramine-APAP, sleep, (TYLENOL PM EXTRA STRENGTH PO) Take 2 tablets by mouth at bedtime as needed (for sleep).    Marland Kitchen HYDROcodone-acetaminophen (NORCO/VICODIN) 5-325 MG per tablet Take 1 tablet by mouth 3 (three) times daily as needed for moderate pain. 60 tablet 0  . levofloxacin (LEVAQUIN) 500 MG tablet Take 0.5 tablets by mouth daily.  0  . levothyroxine (SYNTHROID, LEVOTHROID) 100 MCG tablet TAKE ONE (1) TABLET BY MOUTH EVERY DAY 90 tablet 2  . Multiple Vitamins-Minerals (MULTIVITAMINS THER. W/MINERALS) TABS Take 1 tablet by mouth daily.    . mupirocin ointment (BACTROBAN) 2 % Apply 1 application topically 2 (two) times daily. 22 g 2  . omeprazole (PRILOSEC) 20 MG capsule Take 1 capsule (20 mg total) by mouth daily. 90 capsule 1  . PARoxetine (PAXIL) 10 MG tablet Take 1 tablet (10 mg total) by mouth daily. 30 tablet 11  . pravastatin (PRAVACHOL) 40 MG tablet Take 40 mg by mouth daily.    . Sennosides (EX-LAX) 15 MG TABS Take 15 mg by mouth at bedtime as needed. For constipation    . tamsulosin (FLOMAX) 0.4 MG CAPS capsule TAKE ONE CAPSULE BY MOUTH DAILY 30 capsule 5  . triamcinolone  cream (KENALOG) 0.1 % Apply 1 application topically daily as needed (itching).      No current facility-administered medications for this visit.     Past Medical History  Diagnosis Date  . Back pain   . Hypertension   . Hypercholesteremia   . Hypothyroidism   . Stroke 02/2012    tia  . History of CHF (congestive heart failure)   . Hemorrhoids   . DDD (degenerative disc disease)     of the spine  . Kidney stones   . Nephritis     at age 51  . Bradycardia     with snycope  . SSS (sick sinus syndrome)     in setting of syncope s/p PPM  . CHF (congestive heart failure)   . Dysrhythmia   . GERD (gastroesophageal reflux disease)   . Cellulitis and abscess of leg 09/16/2013    RT LEG  . Macular degeneration     right eye    ROS:   All systems reviewed and negative except as noted in the HPI.   Past Surgical History  Procedure Laterality Date  . Knee surgery      left knee  . Joint replacement      Left knee replacement  . Insert / replace / remove pacemaker    . Permanent pacemaker insertion N/A 10/03/2011    Procedure: PERMANENT PACEMAKER INSERTION;  Surgeon: Duke SalviaSteven C Klein, MD;  [facility];  Service: Cardiovascular;  Laterality: N/A;  . Temporary pacemaker insertion N/A 10/03/2011    Procedure: TEMPORARY PACEMAKER INSERTION;  Surgeon: Quintella Reichertraci R Turner, MD;  Location: [facility] CATH LAB;  Service: Cardiovascular;  Laterality: N/A;     Family History  Problem Relation Age of Onset  . Kidney disease Mother   . Kidney disease Father      History   Social History  . Marital Status: Married    Spouse Name: N/A  . Number of Children: N/A  . Years of Education: N/A   Occupational History  . Not on file.   Social History Main Topics  . Smoking status: Former Smoker    Types: Pipe  . Smokeless tobacco: Never Used  . Alcohol Use: 0.6 oz/week    1 Glasses of wine per week  . Drug Use: No  . Sexual Activity: No   Other Topics Concern  . Not on file    Social History Narrative     BP 124/54 mmHg  Pulse 74  Ht 5\' 4"  (1.626 m)  Wt 139 lb 6.4 oz (63.231 kg)  BMI 23.92 kg/m2  SpO2 98%  Physical Exam:  frail appearing elderly man, NAD HEENT: Unremarkable Neck:  No JVD, no thyromegally Back:  No CVA tenderness Lungs:  Clear with no wheezes HEART:  Regular rate rhythm, 2/6 systolic murmurs consistent with aortic stenosis Abd:  soft, positive bowel sounds, no organomegally, no rebound, no guarding Ext:  2 plus pulses, no edema, no cyanosis, no clubbing Skin:  No rashes no nodules Neuro:  CN II through XII intact, motor grossly intact  EKG - nsr  DEVICE  Normal device function.  See PaceArt for details.   Assess/Plan:

## 2014-06-17 NOTE — Assessment & Plan Note (Signed)
He is asymptomatic. He has multiple thromboembolic risk factors. With his h/o falls, I think systemic anti-coagulation is contraindicated and I have discussed this with the patient and his wife who agree.

## 2014-06-17 NOTE — Assessment & Plan Note (Signed)
His exam is suggestive for moderate to severe AS. He is not symptomatic at this time however.  He is not a surgical candidate.

## 2014-06-17 NOTE — Patient Instructions (Signed)

## 2014-06-17 NOTE — Assessment & Plan Note (Signed)
His [employer] DDD PM is working normally. Will recheck in several months. 

## 2014-06-17 NOTE — Assessment & Plan Note (Signed)
His blood pressure is well controlled. No change in meds.  

## 2014-06-24 ENCOUNTER — Emergency Department (HOSPITAL_COMMUNITY): Payer: Medicare Other

## 2014-06-24 ENCOUNTER — Observation Stay (HOSPITAL_COMMUNITY)
Admission: EM | Admit: 2014-06-24 | Discharge: 2014-06-24 | Disposition: A | Discharge status: Skilled Nursing Facility | Payer: Medicare Other | Attending: Internal Medicine | Admitting: Internal Medicine

## 2014-06-24 ENCOUNTER — Telehealth: Payer: Self-pay | Admitting: Internal Medicine

## 2014-06-24 ENCOUNTER — Encounter (HOSPITAL_COMMUNITY): Payer: Self-pay | Admitting: Emergency Medicine

## 2014-06-24 DIAGNOSIS — I350 Nonrheumatic aortic (valve) stenosis: Secondary | ICD-10-CM | POA: Diagnosis not present

## 2014-06-24 DIAGNOSIS — H3530 Unspecified macular degeneration: Secondary | ICD-10-CM | POA: Diagnosis not present

## 2014-06-24 DIAGNOSIS — R627 Adult failure to thrive: Secondary | ICD-10-CM

## 2014-06-24 DIAGNOSIS — N183 Chronic kidney disease, stage 3 unspecified: Secondary | ICD-10-CM | POA: Diagnosis present

## 2014-06-24 DIAGNOSIS — W19XXXA Unspecified fall, initial encounter: Secondary | ICD-10-CM

## 2014-06-24 DIAGNOSIS — S3282XA Multiple fractures of pelvis without disruption of pelvic ring, initial encounter for closed fracture: Secondary | ICD-10-CM | POA: Diagnosis present

## 2014-06-24 DIAGNOSIS — W1830XA Fall on same level, unspecified, initial encounter: Secondary | ICD-10-CM | POA: Insufficient documentation

## 2014-06-24 DIAGNOSIS — Z882 Allergy status to sulfonamides status: Secondary | ICD-10-CM | POA: Insufficient documentation

## 2014-06-24 DIAGNOSIS — S72111A Displaced fracture of greater trochanter of right femur, initial encounter for closed fracture: Secondary | ICD-10-CM

## 2014-06-24 DIAGNOSIS — Y92002 Bathroom of unspecified non-institutional (private) residence single-family (private) house as the place of occurrence of the external cause: Secondary | ICD-10-CM | POA: Insufficient documentation

## 2014-06-24 DIAGNOSIS — D649 Anemia, unspecified: Secondary | ICD-10-CM | POA: Diagnosis not present

## 2014-06-24 DIAGNOSIS — S32591A Other specified fracture of right pubis, initial encounter for closed fracture: Secondary | ICD-10-CM | POA: Diagnosis not present

## 2014-06-24 DIAGNOSIS — S32810A Multiple fractures of pelvis with stable disruption of pelvic ring, initial encounter for closed fracture: Secondary | ICD-10-CM

## 2014-06-24 DIAGNOSIS — K219 Gastro-esophageal reflux disease without esophagitis: Secondary | ICD-10-CM | POA: Diagnosis not present

## 2014-06-24 DIAGNOSIS — E785 Hyperlipidemia, unspecified: Secondary | ICD-10-CM | POA: Insufficient documentation

## 2014-06-24 DIAGNOSIS — Z87442 Personal history of urinary calculi: Secondary | ICD-10-CM | POA: Insufficient documentation

## 2014-06-24 DIAGNOSIS — I495 Sick sinus syndrome: Secondary | ICD-10-CM | POA: Diagnosis present

## 2014-06-24 DIAGNOSIS — Z79899 Other long term (current) drug therapy: Secondary | ICD-10-CM | POA: Diagnosis not present

## 2014-06-24 DIAGNOSIS — Z950 Presence of cardiac pacemaker: Secondary | ICD-10-CM | POA: Diagnosis not present

## 2014-06-24 DIAGNOSIS — Z87891 Personal history of nicotine dependence: Secondary | ICD-10-CM | POA: Insufficient documentation

## 2014-06-24 DIAGNOSIS — M7989 Other specified soft tissue disorders: Secondary | ICD-10-CM

## 2014-06-24 DIAGNOSIS — M79609 Pain in unspecified limb: Secondary | ICD-10-CM

## 2014-06-24 DIAGNOSIS — E039 Hypothyroidism, unspecified: Secondary | ICD-10-CM | POA: Diagnosis not present

## 2014-06-24 DIAGNOSIS — Z8673 Personal history of transient ischemic attack (TIA), and cerebral infarction without residual deficits: Secondary | ICD-10-CM | POA: Insufficient documentation

## 2014-06-24 DIAGNOSIS — E038 Other specified hypothyroidism: Secondary | ICD-10-CM | POA: Diagnosis not present

## 2014-06-24 DIAGNOSIS — I509 Heart failure, unspecified: Secondary | ICD-10-CM | POA: Diagnosis not present

## 2014-06-24 DIAGNOSIS — I129 Hypertensive chronic kidney disease with stage 1 through stage 4 chronic kidney disease, or unspecified chronic kidney disease: Secondary | ICD-10-CM | POA: Diagnosis not present

## 2014-06-24 DIAGNOSIS — E780 Pure hypercholesterolemia: Secondary | ICD-10-CM | POA: Insufficient documentation

## 2014-06-24 DIAGNOSIS — IMO0002 Reserved for concepts with insufficient information to code with codable children: Secondary | ICD-10-CM

## 2014-06-24 DIAGNOSIS — S329XXA Fracture of unspecified parts of lumbosacral spine and pelvis, initial encounter for closed fracture: Secondary | ICD-10-CM | POA: Diagnosis present

## 2014-06-24 DIAGNOSIS — S32501A Unspecified fracture of right pubis, initial encounter for closed fracture: Secondary | ICD-10-CM | POA: Diagnosis present

## 2014-06-24 LAB — CBC WITH DIFFERENTIAL/PLATELET
BASOS ABS: 0 10*3/uL (ref 0.0–0.1)
BASOS PCT: 0 % (ref 0–1)
BASOS PCT: 0 % (ref 0–1)
Basophils Absolute: 0 10*3/uL (ref 0.0–0.1)
EOS ABS: 0.1 10*3/uL (ref 0.0–0.7)
EOS PCT: 1 % (ref 0–5)
EOS PCT: 1 % (ref 0–5)
Eosinophils Absolute: 0.1 10*3/uL (ref 0.0–0.7)
HCT: 24.8 % — ABNORMAL LOW (ref 39.0–52.0)
HCT: 26.2 % — ABNORMAL LOW (ref 39.0–52.0)
HEMOGLOBIN: 8.4 g/dL — AB (ref 13.0–17.0)
Hemoglobin: 8.7 g/dL — ABNORMAL LOW (ref 13.0–17.0)
LYMPHS ABS: 1 10*3/uL (ref 0.7–4.0)
LYMPHS PCT: 13 % (ref 12–46)
Lymphocytes Relative: 10 % — ABNORMAL LOW (ref 12–46)
Lymphs Abs: 1.7 10*3/uL (ref 0.7–4.0)
MCH: 33.3 pg (ref 26.0–34.0)
MCH: 33 pg (ref 26.0–34.0)
MCHC: 33.9 g/dL (ref 30.0–36.0)
MCHC: 33.2 g/dL (ref 30.0–36.0)
MCV: 98.4 fL (ref 78.0–100.0)
MCV: 99.2 fL (ref 78.0–100.0)
MONO ABS: 1.3 10*3/uL — AB (ref 0.1–1.0)
MONOS PCT: 8 % (ref 3–12)
Monocytes Absolute: 0.9 10*3/uL (ref 0.1–1.0)
Monocytes Relative: 10 % (ref 3–12)
Neutro Abs: 8.5 10*3/uL — ABNORMAL HIGH (ref 1.7–7.7)
Neutro Abs: 10.3 10*3/uL — ABNORMAL HIGH (ref 1.7–7.7)
Neutrophils Relative %: 81 % — ABNORMAL HIGH (ref 43–77)
Neutrophils Relative %: 76 % (ref 43–77)
PLATELETS: 263 10*3/uL (ref 150–400)
Platelets: 239 10*3/uL (ref 150–400)
RBC: 2.52 MIL/uL — AB (ref 4.22–5.81)
RBC: 2.64 MIL/uL — ABNORMAL LOW (ref 4.22–5.81)
RDW: 13 % (ref 11.5–15.5)
RDW: 13.1 % (ref 11.5–15.5)
WBC: 10.5 10*3/uL (ref 4.0–10.5)
WBC: 13.5 10*3/uL — AB (ref 4.0–10.5)

## 2014-06-24 LAB — BASIC METABOLIC PANEL
Anion gap: 9 (ref 5–15)
BUN: 43 mg/dL — AB (ref 6–23)
CHLORIDE: 108 mmol/L (ref 96–112)
CO2: 21 mmol/L (ref 19–32)
CREATININE: 1.82 mg/dL — AB (ref 0.50–1.35)
Calcium: 9.1 mg/dL (ref 8.4–10.5)
GFR calc Af Amer: 34 mL/min — ABNORMAL LOW (ref >90)
GFR calc non Af Amer: 30 mL/min — ABNORMAL LOW (ref >90)
Glucose, Bld: 110 mg/dL — ABNORMAL HIGH (ref 70–99)
Potassium: 4.6 mmol/L (ref 3.5–5.1)
Sodium: 138 mmol/L (ref 135–145)

## 2014-06-24 LAB — COMPREHENSIVE METABOLIC PANEL
ALK PHOS: 115 U/L (ref 39–117)
ALT: 21 U/L (ref 0–53)
AST: 30 U/L (ref 0–37)
Albumin: 3.5 g/dL (ref 3.5–5.2)
Anion gap: 6 (ref 5–15)
BILIRUBIN TOTAL: 1 mg/dL (ref 0.3–1.2)
BUN: 41 mg/dL — AB (ref 6–23)
CALCIUM: 9.1 mg/dL (ref 8.4–10.5)
CO2: 23 mmol/L (ref 19–32)
CREATININE: 1.9 mg/dL — AB (ref 0.50–1.35)
Chloride: 108 mmol/L (ref 96–112)
GFR, EST AFRICAN AMERICAN: 33 mL/min — AB (ref >90)
GFR, EST NON AFRICAN AMERICAN: 28 mL/min — AB (ref >90)
Glucose, Bld: 128 mg/dL — ABNORMAL HIGH (ref 70–99)
Potassium: 4.5 mmol/L (ref 3.5–5.1)
Sodium: 137 mmol/L (ref 135–145)
TOTAL PROTEIN: 6.3 g/dL (ref 6.0–8.3)

## 2014-06-24 LAB — URINE MICROSCOPIC-ADD ON

## 2014-06-24 LAB — URINALYSIS, ROUTINE W REFLEX MICROSCOPIC
Bilirubin Urine: NEGATIVE
Glucose, UA: NEGATIVE mg/dL
Hgb urine dipstick: NEGATIVE
Ketones, ur: NEGATIVE mg/dL
Nitrite: NEGATIVE
Protein, ur: NEGATIVE mg/dL
Specific Gravity, Urine: 1.017 (ref 1.005–1.030)
Urobilinogen, UA: 0.2 mg/dL (ref 0.0–1.0)
pH: 5.5 (ref 5.0–8.0)

## 2014-06-24 MED ORDER — OXYCODONE-ACETAMINOPHEN 5-325 MG PO TABS
1.0000 | ORAL_TABLET | Freq: Once | ORAL | Status: AC
  Administered 2014-06-24: 1 via ORAL
  Filled 2014-06-24: qty 1

## 2014-06-24 MED ORDER — PRAVASTATIN SODIUM 40 MG PO TABS
40.0000 mg | ORAL_TABLET | Freq: Every day | ORAL | Status: DC
  Filled 2014-06-24: qty 1

## 2014-06-24 MED ORDER — SODIUM CHLORIDE 0.9 % IJ SOLN: 3.0000 mL | Freq: Two times a day (BID) | INTRAMUSCULAR | Status: DC

## 2014-06-24 MED ORDER — ONDANSETRON HCL 4 MG PO TABS: 4.0000 mg | ORAL_TABLET | Freq: Four times a day (QID) | ORAL | Status: DC | PRN

## 2014-06-24 MED ORDER — METHOCARBAMOL 500 MG PO TABS
500.0000 mg | ORAL_TABLET | Freq: Three times a day (TID) | ORAL | Status: DC | PRN
Start: 2014-06-24 — End: 2014-06-24

## 2014-06-24 MED ORDER — MORPHINE SULFATE 2 MG/ML IJ SOLN: 1.0000 mg | INTRAMUSCULAR | Status: DC | PRN

## 2014-06-24 MED ORDER — ONDANSETRON HCL 4 MG/2ML IJ SOLN
4.0000 mg | Freq: Four times a day (QID) | INTRAMUSCULAR | Status: DC | PRN
Start: 2014-06-24 — End: 2014-06-24

## 2014-06-24 MED ORDER — CLOPIDOGREL BISULFATE 75 MG PO TABS
75.0000 mg | ORAL_TABLET | Freq: Every day | ORAL | Status: DC
  Administered 2014-06-24: 75 mg via ORAL
  Filled 2014-06-24: qty 1

## 2014-06-24 MED ORDER — ACETAMINOPHEN 325 MG PO TABS: 650.0000 mg | ORAL_TABLET | Freq: Four times a day (QID) | ORAL | Status: DC | PRN

## 2014-06-24 MED ORDER — HYDROCODONE-ACETAMINOPHEN 5-325 MG PO TABS: 1.0000 | ORAL_TABLET | Freq: Four times a day (QID) | ORAL | Status: DC | PRN

## 2014-06-24 MED ORDER — TAMSULOSIN HCL 0.4 MG PO CAPS
0.4000 mg | ORAL_CAPSULE | Freq: Every day | ORAL | Status: DC
  Administered 2014-06-24: 0.4 mg via ORAL
  Filled 2014-06-24: qty 1

## 2014-06-24 MED ORDER — VITAMIN C 500 MG PO TABS
1000.0000 mg | ORAL_TABLET | Freq: Every day | ORAL | Status: DC
  Administered 2014-06-24: 1000 mg via ORAL
  Filled 2014-06-24: qty 2

## 2014-06-24 MED ORDER — LEVOTHYROXINE SODIUM 100 MCG PO TABS
100.0000 ug | ORAL_TABLET | Freq: Every day | ORAL | Status: DC
  Administered 2014-06-24: 100 ug via ORAL
  Filled 2014-06-24: qty 1

## 2014-06-24 MED ORDER — TRIAMCINOLONE ACETONIDE 0.1 % EX CREA: 1.0000 "application " | TOPICAL_CREAM | Freq: Every day | CUTANEOUS | Status: DC | PRN

## 2014-06-24 MED ORDER — ZOLPIDEM TARTRATE 5 MG PO TABS: 5.0000 mg | ORAL_TABLET | Freq: Every evening | ORAL | Status: DC | PRN

## 2014-06-24 MED ORDER — DIPHENHYDRAMINE-APAP (SLEEP) 25-500 MG PO TABS: 1.0000 | ORAL_TABLET | Freq: Every evening | ORAL | Status: DC | PRN

## 2014-06-24 MED ORDER — AMLODIPINE BESYLATE 5 MG PO TABS
5.0000 mg | ORAL_TABLET | Freq: Every day | ORAL | Status: DC
  Administered 2014-06-24: 5 mg via ORAL
  Filled 2014-06-24: qty 1

## 2014-06-24 MED ORDER — ACETAMINOPHEN 650 MG RE SUPP: 650.0000 mg | Freq: Four times a day (QID) | RECTAL | Status: DC | PRN

## 2014-06-24 MED ORDER — ACETAMINOPHEN 500 MG PO TABS: 500.0000 mg | ORAL_TABLET | Freq: Every evening | ORAL | Status: DC | PRN

## 2014-06-24 MED ORDER — ENOXAPARIN SODIUM 30 MG/0.3ML ~~LOC~~ SOLN
30.0000 mg | Freq: Every day | SUBCUTANEOUS | Status: DC
  Administered 2014-06-24: 30 mg via SUBCUTANEOUS
  Filled 2014-06-24: qty 0.3

## 2014-06-24 MED ORDER — MUPIROCIN 2 % EX OINT: 1.0000 "application " | TOPICAL_OINTMENT | Freq: Two times a day (BID) | CUTANEOUS | Status: DC | PRN

## 2014-06-24 MED ORDER — HYDROCODONE-ACETAMINOPHEN 5-325 MG PO TABS
1.0000 | ORAL_TABLET | Freq: Three times a day (TID) | ORAL | Status: DC | PRN
Start: 2014-06-24 — End: 2014-06-24
  Administered 2014-06-24: 1 via ORAL
  Filled 2014-06-24: qty 1

## 2014-06-24 MED ORDER — DOXYCYCLINE HYCLATE 50 MG PO CAPS: 100.0000 mg | ORAL_CAPSULE | Freq: Two times a day (BID) | ORAL | Status: DC

## 2014-06-24 MED ORDER — PAROXETINE HCL 20 MG PO TABS: 10.0000 mg | ORAL_TABLET | Freq: Every day | ORAL | Status: DC

## 2014-06-24 MED ORDER — PANTOPRAZOLE SODIUM 40 MG PO TBEC
40.0000 mg | DELAYED_RELEASE_TABLET | Freq: Every day | ORAL | Status: DC
  Administered 2014-06-24: 40 mg via ORAL
  Filled 2014-06-24: qty 1

## 2014-06-24 MED ORDER — AMOXICILLIN-POT CLAVULANATE 875-125 MG PO TABS: 1.0000 | ORAL_TABLET | Freq: Two times a day (BID) | ORAL | Status: DC

## 2014-06-24 NOTE — Clinical Social Work Placement (Signed)
Clinical Social Work Department CLINICAL SOCIAL WORK PLACEMENT NOTE 06/24/2014  Patient:  John Weaver,John Weaver  Account Number:  [id number] Admit date:  06/24/2014  Clinical Social Worker:  Hortencia PilarKIERRA Naevia Unterreiner, CLINICAL SOCIAL WORKER  Date/time:  06/24/2014 11:54 AM  Clinical Social Work is seeking post-discharge placement for this patient at the following level of care:   SKILLED NURSING   (*CSW will update this form in Epic as items are completed)   06/24/2014  [facility]/family provided with [facility] of Clinical Social Work's list of facilities offering this level of care within the geographic area requested by the patient (or if unable, by the patient's family).  06/24/2014  Patient/family informed of their freedom to choose among providers that offer the needed level of care, that participate in Medicare, Medicaid or managed care program needed by the patient, have an available bed and are willing to accept the patient.  06/24/2014  Patient/family informed of [facility]' ownership interest in [facility], as well as of the fact that they are under no obligation to receive care at this facility.  PASARR submitted to EDS on 06/24/2014 PASARR number received on 06/24/2014  FL2 transmitted to all facilities in geographic area requested by pt/family on  06/24/2014 FL2 transmitted to all facilities within larger geographic area on   Patient informed that his/her managed care company has contracts with or will negotiate with  certain facilities, including the following:     Patient/family informed of bed offers received:  06/24/2014 [facility] Physician recommends and patient chooses bed at    [facility] on  06/24/2014 Patient to be transferred to facility by [facility] Patient and family notified of transfer on  Name of family member notified:    The following physician request  were entered in Epic:   Additional Comments:   Telisha Zawadzki S. Naryiah Schley, BSW-Intern

## 2014-06-24 NOTE — Consult Note (Signed)
Patient ID: John NeighborsFritz Broxton MRN: [id number] DOB/AGE: 06/20/1915 81 y.o.  Admit date: 06/24/2014  Admission Diagnoses:  Principal Problem:   Multiple pelvic fractures Active Problems:   Hypothyroidism   Aortic stenosis   Pacemaker-St.Jude   Chronic kidney disease   SSS (sick sinus syndrome)   Chronic anemia   Pelvic fracture   HPI: Patient fell yesterday landing on right side causing right pubic rami and right greater trochanter fractures.  Being seen at the request of medical service.  Complains of right hip pain along with right calf pain.  No complaints of chest pain.  Some dyspnea.    Past Medical History: Past Medical History  Diagnosis Date  . Back pain   . Hypertension   . Hypercholesteremia   . Hypothyroidism   . Stroke 02/2012    tia  . History of CHF (congestive heart failure)   . Hemorrhoids   . DDD (degenerative disc disease)     of the spine  . Kidney stones   . Nephritis     at age 81  . Bradycardia     with snycope  . SSS (sick sinus syndrome)     in setting of syncope s/p PPM  . CHF (congestive heart failure)   . Dysrhythmia   . GERD (gastroesophageal reflux disease)   . Cellulitis and abscess of leg 09/16/2013    RT LEG  . Macular degeneration     right eye    Surgical History: Past Surgical History  Procedure Laterality Date  . Knee surgery      left knee  . Joint replacement      Left knee replacement  . Insert / replace / remove pacemaker    . Permanent pacemaker insertion N/A 10/03/2011    Procedure: PERMANENT PACEMAKER INSERTION;  Surgeon: Duke SalviaSteven C Klein, MD;  [facility];  Service: Cardiovascular;  Laterality: N/A;  . Temporary pacemaker insertion N/A 10/03/2011    Procedure: TEMPORARY PACEMAKER INSERTION;  Surgeon: Quintella Reichertraci R Turner, MD;  Location: [facility] CATH LAB;  Service: Cardiovascular;  Laterality: N/A;    Family History: Family History  Problem Relation Age of Onset  . Kidney disease Mother   . Kidney disease Father      Social History: History   Social History  . Marital Status: Married    Spouse Name: N/A  . Number of Children: N/A  . Years of Education: N/A   Occupational History  . Not on file.   Social History Main Topics  . Smoking status: Former Smoker    Types: Pipe  . Smokeless tobacco: Never Used  . Alcohol Use: 0.6 oz/week    1 Glasses of wine per week  . Drug Use: No  . Sexual Activity: No   Other Topics Concern  . Not on file   Social History Narrative    Allergies: Sulfa antibiotics  Medications: I have reviewed the patient's current medications.  Vital Signs: Patient Vitals for the past 24 hrs:  BP Temp Temp src Pulse Resp SpO2 Height Weight  06/24/14 0500 (!) 120/52 mmHg 97.7 F (36.5 C) - 99 18 92 % - -  06/24/14 0430 (!) 110/53 mmHg - - 88 20 97 % - -  06/24/14 0415 (!) 124/51 mmHg - - 90 19 99 % - -  06/24/14 0400 109/60 mmHg - - 91 19 99 % - -  06/24/14 0345 (!) 111/54 mmHg - - 89 17 98 % - -  06/24/14 0204 (!) 124/54  mmHg 97.7 F (36.5 C) Oral - 17 99 %  (1.626 m) 60.328 kg (133 lb)    Radiology: Dg Chest 2 View  06/06/2014   CLINICAL DATA:  Chills; hypertension  EXAM: CHEST  2 VIEW  COMPARISON:  April 16, 2014  FINDINGS: There is a calcified granuloma in the left lower lobe. There is no edema or consolidation. Heart size and pulmonary vascularity are within normal limits. Pacemaker leads are attached to the right atrium and right ventricle. No pneumothorax. No adenopathy. There is atherosclerotic change in the aorta. There is thoracolumbar levoscoliosis. Bones are diffusely osteoporotic. There is extensive arthropathy in the left shoulder.  IMPRESSION: No edema or consolidation. Granuloma left lower lobe. No change in cardiac silhouette. Bones osteoporotic.   Electronically Signed   By: Bretta Bang III M.D.   On: 06/06/2014 13:27   Ct Head Wo Contrast  06/06/2014   CLINICAL DATA:  Altered mental status.  Initial encounter.  EXAM: CT HEAD  WITHOUT CONTRAST  TECHNIQUE: Contiguous axial images were obtained from the base of the skull through the vertex without intravenous contrast.  COMPARISON:  02/29/2012.  FINDINGS: No mass lesion, mass effect, midline shift, hydrocephalus, hemorrhage. No acute territorial cortical ischemia/infarct. Atrophy and chronic ischemic white matter disease is present. Calvarium intact. LEFT-sided nasal septal spur. Intracranial atherosclerosis.  IMPRESSION: Atrophy and chronic ischemic white matter disease without an acute intracranial abnormality.   Electronically Signed   By: Andreas Newport M.D.   On: 06/06/2014 12:50   Dg Hip Unilat With Pelvis 1v Right  06/24/2014   CLINICAL DATA:  Larey Seat tonight.  EXAM: RIGHT HIP (WITH PELVIS) 1 VIEW  COMPARISON:  None.  FINDINGS: There are fractures of the superior and inferior right pubic rami. There is a fracture of the right greater trochanter. There is no evidence of a fracture line extending into the femoral neck. Acetabulum appears intact.  IMPRESSION: Right pubic ramus fractures.  Right greater trochanter fracture.   Electronically Signed   By: Ellery Plunk M.D.   On: 06/24/2014 03:08    Labs:  Recent Labs  06/24/14 0327 06/24/14 0746  WBC 10.5 13.5*  RBC 2.52* 2.64*  HCT 24.8* 26.2*  PLT 239 263    Recent Labs  06/24/14 0327  NA 138  K 4.6  CL 108  CO2 21  BUN 43*  CREATININE 1.82*  GLUCOSE 110*  CALCIUM 9.1   No results for input(s): LABPT, INR in the last 72 hours.  Review of Systems: Review of Systems  Respiratory: Positive for shortness of breath.   Musculoskeletal: Positive for joint pain and falls.    Physical Exam: Alert and oriented.  Positive log roll right hip.  Lateral hip tender.  Swelling right mid calf to foot.  Right calf is mod-markedly tender.  3+ pitting edema on right.  Left calf unremarkable.  Neurologically intact. Right PT pulse nonpalpable due to swelling.  DP palpable.    Assessment: Right pubic rami and right  greater trochanter fractures. Right calf pain and lower leg swelling.  Plan: Recommend non-operative management for the fractures.  Dr Ophelia Charter has discussed with hospitalist.  NWB right leg.  Will get STAT right LE venous doppler this morning to r/o DVT. Advised nursing staff that the doppler was ordered.  If study is negative patient will f/u with Dr Ophelia Charter in the clinic in 2 weeks.    Genene Churn. Barry Dienes PA-C

## 2014-06-24 NOTE — ED Notes (Signed)
Per EMS, pt is from [facility] on [address] EMS reports that the pt slipped and fell earlier int he bathroom. PT passed [facility] with EMS. Pt reporting right hip pain that is worse than normal. Pt takes Plavix.

## 2014-06-24 NOTE — H&P (Signed)
[facility] History and Physical  John Weaver ZOX:[id number] DOB: Aug 25, 1915 DOA: 06/24/2014  Referring physician: ER physician. PCP: Margaree Mackintosh, MD   Chief Complaint: Fall.  HPI: John Weaver is a 81 y.o. male with history of aortic stenosis, hypertension, hyperlipidemia, hypothyroidism, symptomatic bradycardia status post pacemaker placement, chronic kidney disease was brought to the ER after patient had a fall at his home. Patient states he was walking to the bathroom when he suddenly stepped and fell. Denies hitting his head or losing consciousness. Denies having any palpitations shortness of breath or chest pain. Patient off of the fall stood up and started experiencing right hip pain. Patient was brought to the ER and x-rays revealed a right-sided pubic rami fractures with fracture of the right greater trochanter. On-call orthopedic surgeon Dr. Ophelia Charter was consulted by the ER physician and Dr. Ophelia Charter has advised nonsurgical management.   Review of Systems: As presented in the history of presenting illness, rest negative.  Past Medical History  Diagnosis Date  . Back pain   . Hypertension   . Hypercholesteremia   . Hypothyroidism   . Stroke 02/2012    tia  . History of CHF (congestive heart failure)   . Hemorrhoids   . DDD (degenerative disc disease)     of the spine  . Kidney stones   . Nephritis     at age 46  . Bradycardia     with snycope  . SSS (sick sinus syndrome)     in setting of syncope s/p PPM  . CHF (congestive heart failure)   . Dysrhythmia   . GERD (gastroesophageal reflux disease)   . Cellulitis and abscess of leg 09/16/2013    RT LEG  . Macular degeneration     right eye   Past Surgical History  Procedure Laterality Date  . Knee surgery      left knee  . Joint replacement      Left knee replacement  . Insert / replace / remove pacemaker    . Permanent pacemaker insertion N/A 10/03/2011    Procedure: PERMANENT PACEMAKER INSERTION;  Surgeon:  Duke Salvia, MD;  Location: [facility] CATH LAB;  Service: Cardiovascular;  Laterality: N/A;  . Temporary pacemaker insertion N/A 10/03/2011    Procedure: TEMPORARY PACEMAKER INSERTION;  Surgeon: Quintella Reichert, MD;  Location: [facility] CATH LAB;  Service: Cardiovascular;  Laterality: N/A;   Social History:  reports that he has quit smoking. His smoking use included Pipe. He has never used smokeless tobacco. He reports that he drinks about 0.6 oz of alcohol per week. He reports that he does not use illicit drugs. Where does patient live home. Can patient participate in ADLs? Yes.  Allergies  Allergen Reactions  . Sulfa Antibiotics Rash    Family History:  Family History  Problem Relation Age of Onset  . Kidney disease Mother   . Kidney disease Father       Prior to Admission medications   Medication Sig Start Date End Date Taking? Authorizing Provider  amLODipine (NORVASC) 5 MG tablet Take 1 tablet (5 mg total) by mouth daily. 06/04/14  Yes Quintella Reichert, MD  Ascorbic Acid (VITAMIN C) 1000 MG tablet Take 1,000 mg by mouth daily.   Yes Historical Provider, MD  clopidogrel (PLAVIX) 75 MG tablet TAKE ONE (1) TABLET BY MOUTH EVERY DAY 04/15/14  Yes Margaree Mackintosh, MD  Diphenhydramine-APAP, sleep, (TYLENOL PM EXTRA STRENGTH PO) Take 2 tablets by mouth at bedtime as needed (for sleep).  Yes Historical Provider, MD  HYDROcodone-acetaminophen (NORCO/VICODIN) 5-325 MG per tablet Take 1 tablet by mouth 3 (three) times daily as needed for moderate pain. 05/12/14  Yes Margaree MackintoshMary J [name], MD  levofloxacin (LEVAQUIN) 500 MG tablet Take 0.5 tablets by mouth daily. 06/16/14  Yes Historical Provider, MD  levothyroxine (SYNTHROID, LEVOTHROID) 100 MCG tablet TAKE ONE (1) TABLET BY MOUTH EVERY DAY 04/21/14  Yes Margaree MackintoshMary J [name], MD  Multiple Vitamins-Minerals (MULTIVITAMINS THER. W/MINERALS) TABS Take 1 tablet by mouth daily.   Yes Historical Provider, MD  mupirocin ointment (BACTROBAN) 2 % Apply 1 application topically 2 (two) times  daily. Patient taking differently: Apply 1 application topically 2 (two) times daily as needed (rash).  09/30/13  Yes Margaree MackintoshMary J [name], MD  omeprazole (PRILOSEC) 20 MG capsule Take 1 capsule (20 mg total) by mouth daily. 04/22/14  Yes Margaree MackintoshMary J [name], MD  PARoxetine (PAXIL) 10 MG tablet Take 1 tablet (10 mg total) by mouth daily. 01/06/14  Yes Margaree MackintoshMary J [name], MD  pravastatin (PRAVACHOL) 40 MG tablet Take 40 mg by mouth daily.   Yes Historical Provider, MD  Sennosides (EX-LAX) 15 MG TABS Take 15 mg by mouth at bedtime as needed. For constipation   Yes Historical Provider, MD  tamsulosin (FLOMAX) 0.4 MG CAPS capsule TAKE ONE CAPSULE BY MOUTH DAILY 05/27/14  Yes Margaree MackintoshMary J [name], MD  triamcinolone cream (KENALOG) 0.1 % Apply 1 application topically daily as needed (itching).  11/13/13  Yes Historical Provider, MD    Physical Exam: Filed Vitals:   06/24/14 0345 06/24/14 0400 06/24/14 0415 06/24/14 0430  BP: 111/54 109/60 124/51 110/53  Pulse: 89 91 90 88  Temp:      TempSrc:      Resp: 17 19 19 20   Height:      Weight:      SpO2: 98% 99% 99% 97%     General:  Well built and nourished.  Eyes: Anicteric no pallor.  ENT: No discharge from the ears eyes nose or mouth.  Neck: No mass felt.  Cardiovascular: S1 and S2 heard.  Respiratory: No rhonchi or crepitations.  Abdomen: Soft nontender bowel sounds present.  Skin: No rash.  Musculoskeletal: Pain on moving right hip.  Psychiatric: Appears normal.  Neurologic: Alert awake oriented to time place and person. Moves all extremities.  Labs on Admission:  Basic Metabolic Panel:  Recent Labs Lab 06/24/14 0327  NA 138  K 4.6  CL 108  CO2 21  GLUCOSE 110*  BUN 43*  CREATININE 1.82*  CALCIUM 9.1   Liver Function Tests: No results for input(s): AST, ALT, ALKPHOS, BILITOT, PROT, ALBUMIN in the last 168 hours. No results for input(s): LIPASE, AMYLASE in the last 168 hours. No results for input(s): AMMONIA in the last 168  hours. CBC:  Recent Labs Lab 06/24/14 0327  WBC 10.5  NEUTROABS 8.5*  HGB 8.4*  HCT 24.8*  MCV 98.4  PLT 239   Cardiac Enzymes: No results for input(s): CKTOTAL, CKMB, CKMBINDEX, TROPONINI in the last 168 hours.  BNP (last 3 results) No results for input(s): BNP in the last 8760 hours.  ProBNP (last 3 results)  Recent Labs  09/16/13 1315  PROBNP 1016.0*    CBG: No results for input(s): GLUCAP in the last 168 hours.  Radiological Exams on Admission: Dg Hip Unilat With Pelvis 1v Right  06/24/2014   CLINICAL DATA:  Larey SeatFell tonight.  EXAM: RIGHT HIP (WITH PELVIS) 1 VIEW  COMPARISON:  None.  FINDINGS: There are fractures  of the superior and inferior right pubic rami. There is a fracture of the right greater trochanter. There is no evidence of a fracture line extending into the femoral neck. Acetabulum appears intact.  IMPRESSION: Right pubic ramus fractures.  Right greater trochanter fracture.   Electronically Signed   By: Ellery Plunk M.D.   On: 06/24/2014 03:08    EKG: Independently reviewed. A. fib rate controlled with nonspecific ST-T changes.  Assessment/Plan Principal Problem:   Multiple pelvic fractures Active Problems:   Hypothyroidism   Aortic stenosis   Pacemaker-St.Jude   Chronic kidney disease   SSS (sick sinus syndrome)   Chronic anemia   Pelvic fracture   1. Right-sided pubic rami fractures with greater trochanter fracture - Dr. Ophelia Charter on call orthopedic surgeon was consulted and at this time no creates is advised nonsurgical management with nonweightbearing. Patient has significant pain on moving and has been admitted for pain management and physical therapy consult. In addition to his home pain medications I have placed patient on when necessary morphine and Robaxin. 2. Hypertension - continue present medications. 3. Hyperlipidemia - on statins. 4. Chronic anemia - hemoglobin at baseline. Closely follow CBC. 5. History of symptomatic bradycardia  status post pacemaker placement. 6. Hypothyroidism - on Synthroid. 7. Chronic kidney disease stage III - creatinine appears to be at baseline.   DVT Prophylaxis Lovenox.  Code Status: DO NOT RESUSCITATE.  Family Communication: None.  Disposition Plan: Admit to inpatient.    KAKRAKANDY,ARSHAD N. [facility] Pager [phone].  If 7PM-7AM, please contact night-coverage [email or web address] Password [facility] 06/24/2014, 5:13 AM

## 2014-06-24 NOTE — Progress Notes (Signed)
[facility]

## 2014-06-24 NOTE — Progress Notes (Signed)
*  PRELIMINARY RESULTS* Vascular Ultrasound Right lower extremity venous duplex has been completed.  Preliminary findings: No evidence of DVT.  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 This was documented in study from 09/17/13 as well.    Farrel DemarkJill Eunice, RDMS, RVT  06/24/2014, 11:15 AM

## 2014-06-24 NOTE — Care Management Note (Signed)
CARE MANAGEMENT NOTE 06/24/2014  Patient:  John Weaver,John Weaver   Account Number:  [id number]  Date Initiated:  06/24/2014  Documentation initiated by:  Vance PeperBRADY,Darris Staiger  Subjective/Objective Assessment:   81 yr old male admitted s/p fall with right side pubic rami fracture with Fx. of greater trochanter.i     Action/Plan:   [facility]. Social Worker has arranged.   Anticipated DC Date:  06/24/2014   Anticipated DC Plan:  SKILLED NURSING FACILITY  In-house referral  Clinical Social Worker      DC Planning Services  CM consult      [facility]  NA   Choice offered to / List presented to:  NA   DME arranged  NA        HH arranged  NA      Status of service:  Completed, signed off Medicare Important Message given?   (If response is "NO", the following Medicare IM given date fields will be blank) Date Medicare IM given:   Medicare IM given by:   Date Additional Medicare IM given:   Additional Medicare IM given by:    Discharge Disposition:  SKILLED NURSING FACILITY  Per UR Regulation:    If discussed at Long Length of Stay Meetings, dates discussed:    Comments:

## 2014-06-24 NOTE — Discharge Summary (Addendum)
Discharge Summary  Christofer Shen XBM:[id number] DOB: 1915/11/12  PCP: Margaree Mackintosh, MD  Admit date: 06/24/2014 Discharge date: 06/24/2014  Time spent: >55mins  Recommendations for Outpatient Follow-up:  1. F/u with PMD in one week, pmd to f/u on urine culture result from 3/22, repeat cbc in one week, to ensure resolution of leukocytosis.  Discharge Diagnoses:  Active Hospital Problems   Diagnosis Date Noted  . Multiple pelvic fractures 06/24/2014  . Chronic anemia 06/24/2014  . Pelvic fracture 06/24/2014  . SSS (sick sinus syndrome)   . Chronic kidney disease 02/04/2012  . Aortic stenosis 10/03/2011  . Pacemaker-St.Jude 10/03/2011  . Hypothyroidism 10/01/2011    Resolved Hospital Problems   Diagnosis Date Noted Date Resolved  No resolved problems to display.    Discharge Condition: stable  Diet recommendation:  Heart healthy  Filed Weights   06/24/14 0204  Weight: 60.328 kg (133 lb)    History of present illness:  John Weaver is a 81 y.o. male with history of aortic stenosis, hypertension, hyperlipidemia, hypothyroidism, symptomatic bradycardia status post pacemaker placement, chronic kidney disease was brought to the ER after patient had a fall at his home. Patient states he was walking to the bathroom when he suddenly stepped and fell. Denies hitting his head or losing consciousness. Denies having any palpitations shortness of breath or chest pain. Patient off of the fall stood up and started experiencing right hip pain. Patient was brought to the ER and x-rays revealed a right-sided pubic rami fractures with fracture of the right greater trochanter. On-call orthopedic surgeon Dr. Ophelia Charter was consulted.  Hospital Course:  Principal Problem:   Multiple pelvic fractures Active Problems:   Hypothyroidism   Aortic stenosis   Pacemaker-St.Jude   Chronic kidney disease   SSS (sick sinus syndrome)   Chronic anemia   Pelvic fracture  1. Right-sided pubic rami  fractures with greater trochanter fracture - Dr. Ophelia Charter on call orthopedic surgeon was consulted and at this time no creates is advised nonsurgical management with nonweightbearing. Patient has significant pain on moving and has been admitted for pain management and physical therapy consult. orhto recommended medical management, cleared patient for discharge, social worker consulted, patient is to discharge to SNF for ongoing pain control/PT/OT. 2. Hypertension - stable on home meds. 3. Hyperlipidemia - on statins. 4. Chronic anemia - hemoglobin at baseline.  5. History of symptomatic bradycardia status post pacemaker placement. 6. Hypothyroidism - on Synthroid. 7. Chronic kidney disease stage III - creatinine appears to be at baseline. 8. Mild Leukocytosis: vital stable, patient nontoxic appearing, no cough, no fever, denies urinary symptom, no diarrhea. reported resolving right lower extremity cellulitis with abx treatment, right lower extremity venous duplex negative for DVT. UA small leukocyte/few bacteria, review urine culture from 3/9 showed proteus sensitive to augmentin. patient is prescribed with Augmentin at discharge, pmd follow up urine culture and repeat cbc in one week.  Procedures:  none  Consultations:  ortho  Discharge Exam: BP 124/63 mmHg  Pulse 82  Temp(Src) 97.3 F (36.3 C) (Oral)  Resp 16  Ht  (1.626 m)  Wt 60.328 kg (133 lb)  BMI 22.82 kg/m2  SpO2 96%  9. General: Well built and nourished, pleasant. 10. Eyes: Anicteric no pallor. 11. ENT: No discharge from the ears eyes nose or mouth. 12. Neck: No mass felt. 13. Cardiovascular: S1 and S2 heard. 14. Respiratory: No rhonchi or crepitations. 15. Abdomen: Soft nontender bowel sounds present. 16. Skin: mild erythema right lower extremity anterior shin, nontender,  no edema 17. Musculoskeletal: Pain on moving right hip. Good peripheral pulses 18. Psychiatric: Appears normal. 19. Neurologic: Alert awake  oriented to time place and person. Moves all extremities.   Discharge Instructions You were cared for by a hospitalist during your hospital stay. If you have any questions about your discharge medications or the care you received while you were in the hospital after you are discharged, you can call the unit and asked to speak with the hospitalist on call if the hospitalist that took care of you is not available. Once you are discharged, your primary care physician will handle any further medical issues. Please note that NO REFILLS for any discharge medications will be authorized once you are discharged, as it is imperative that you return to your primary care physician (or establish a relationship with a primary care physician if you do not have one) for your aftercare needs so that they can reassess your need for medications and monitor your lab values.      Discharge Instructions    Diet - low sodium heart healthy    Complete by:  As directed      Increase activity slowly    Complete by:  As directed             Medication List    STOP taking these medications        levofloxacin 500 MG tablet  Commonly known as:  LEVAQUIN      TAKE these medications        amLODipine 5 MG tablet  Commonly known as:  NORVASC  Take 1 tablet (5 mg total) by mouth daily.     amoxicillin-clavulanate 875-125 MG per tablet  Commonly known as:  AUGMENTIN  Take 1 tablet by mouth 2 (two) times daily.     clopidogrel 75 MG tablet  Commonly known as:  PLAVIX  TAKE ONE (1) TABLET BY MOUTH EVERY DAY     EX-LAX 15 MG Tabs  Generic drug:  Sennosides  Take 15 mg by mouth at bedtime as needed. For constipation     HYDROcodone-acetaminophen 5-325 MG per tablet  Commonly known as:  NORCO/VICODIN  Take 1 tablet by mouth every 6 (six) hours as needed for moderate pain or severe pain.     levothyroxine 100 MCG tablet  Commonly known as:  SYNTHROID, LEVOTHROID  TAKE ONE (1) TABLET BY MOUTH EVERY DAY      multivitamins ther. w/minerals Tabs tablet  Take 1 tablet by mouth daily.     mupirocin ointment 2 %  Commonly known as:  BACTROBAN  Apply 1 application topically 2 (two) times daily.     omeprazole 20 MG capsule  Commonly known as:  PRILOSEC  Take 1 capsule (20 mg total) by mouth daily.     PARoxetine 10 MG tablet  Commonly known as:  PAXIL  Take 1 tablet (10 mg total) by mouth daily.     pravastatin 40 MG tablet  Commonly known as:  PRAVACHOL  Take 40 mg by mouth daily.     tamsulosin 0.4 MG Caps capsule  Commonly known as:  FLOMAX  TAKE ONE CAPSULE BY MOUTH DAILY     triamcinolone cream 0.1 %  Commonly known as:  KENALOG  Apply 1 application topically daily as needed (itching).     TYLENOL PM EXTRA STRENGTH PO  Take 2 tablets by mouth at bedtime as needed (for sleep).     vitamin C 1000 MG tablet  Take 1,000 mg  by mouth daily.       Allergies  Allergen Reactions  . Sulfa Antibiotics Rash   Follow-up Information    Follow up with Margaree MackintoshBAXLEY,[name], MD In 1 week.   Specialty:  Internal Medicine   Why:  pmd to f/u on urine culture from 3/22 and repeat cbc in one week   Contact information:   [address] [facility] [address] [facility] [id number] [phone]        The results of significant diagnostics from this hospitalization (including imaging, microbiology, ancillary and laboratory) are listed below for reference.    Significant Diagnostic Studies: Dg Chest 2 View  06/06/2014   CLINICAL DATA:  Chills; hypertension  EXAM: CHEST  2 VIEW  COMPARISON:  April 16, 2014  FINDINGS: There is a calcified granuloma in the left lower lobe. There is no edema or consolidation. Heart size and pulmonary vascularity are within normal limits. Pacemaker leads are attached to the right atrium and right ventricle. No pneumothorax. No adenopathy. There is atherosclerotic change in the aorta. There is thoracolumbar levoscoliosis. Bones are diffusely osteoporotic. There is extensive  arthropathy in the left shoulder.  IMPRESSION: No edema or consolidation. Granuloma left lower lobe. No change in cardiac silhouette. Bones osteoporotic.   Electronically Signed   By: Bretta BangWilliam  Woodruff III M.D.   On: 06/06/2014 13:27   Ct Head Wo Contrast  06/06/2014   CLINICAL DATA:  Altered mental status.  Initial encounter.  EXAM: CT HEAD WITHOUT CONTRAST  TECHNIQUE: Contiguous axial images were obtained from the base of the skull through the vertex without intravenous contrast.  COMPARISON:  02/29/2012.  FINDINGS: No mass lesion, mass effect, midline shift, hydrocephalus, hemorrhage. No acute territorial cortical ischemia/infarct. Atrophy and chronic ischemic white matter disease is present. Calvarium intact. LEFT-sided nasal septal spur. Intracranial atherosclerosis.  IMPRESSION: Atrophy and chronic ischemic white matter disease without an acute intracranial abnormality.   Electronically Signed   By: Andreas NewportGeoffrey  Lamke M.D.   On: 06/06/2014 12:50   Dg Hip Unilat With Pelvis 1v Right  06/24/2014   CLINICAL DATA:  Larey SeatFell tonight.  EXAM: RIGHT HIP (WITH PELVIS) 1 VIEW  COMPARISON:  None.  FINDINGS: There are fractures of the superior and inferior right pubic rami. There is a fracture of the right greater trochanter. There is no evidence of a fracture line extending into the femoral neck. Acetabulum appears intact.  IMPRESSION: Right pubic ramus fractures.  Right greater trochanter fracture.   Electronically Signed   By: Ellery Plunkaniel R Mitchell M.D.   On: 06/24/2014 03:08    Microbiology: No results found for this or any previous visit (from the past 240 hour(s)).   Labs: Basic Metabolic Panel:  Recent Labs Lab 06/24/14 0327 06/24/14 0746  NA 138 137  K 4.6 4.5  CL 108 108  CO2 21 23  GLUCOSE 110* 128*  BUN 43* 41*  CREATININE 1.82* 1.90*  CALCIUM 9.1 9.1   Liver Function Tests:  Recent Labs Lab 06/24/14 0746  AST 30  ALT 21  ALKPHOS 115  BILITOT 1.0  PROT 6.3  ALBUMIN 3.5   No results  for input(s): LIPASE, AMYLASE in the last 168 hours. No results for input(s): AMMONIA in the last 168 hours. CBC:  Recent Labs Lab 06/24/14 0327 06/24/14 0746  WBC 10.5 13.5*  NEUTROABS 8.5* 10.3*  HGB 8.4* 8.7*  HCT 24.8* 26.2*  MCV 98.4 99.2  PLT 239 263   Cardiac Enzymes: No results for input(s): CKTOTAL, CKMB, CKMBINDEX, TROPONINI in the last 168  hours. BNP: BNP (last 3 results) No results for input(s): BNP in the last 8760 hours.  ProBNP (last 3 results)  Recent Labs  09/16/13 1315  PROBNP 1016.0*    CBG: No results for input(s): GLUCAP in the last 168 hours.     SignedAlbertine Grates MD, PhD  [facility] 06/24/2014, 3:40 PM

## 2014-06-24 NOTE — ED Provider Notes (Signed)
CSN: [id number]     Arrival date & time 06/24/14  0152 History  This chart was scribed for Shon Batonourtney F Makenli Derstine, MD by Annye AsaAnna Dorsett, ED Scribe. This patient was seen in room A08C/A08C and the patient's care was started at 2:14 AM.    Chief Complaint  Patient presents with  . Fall   The history is provided by the patient and the spouse. No language interpreter was used.     HPI Comments: John Weaver is a 81 y.o. male with past medical history of HTN, HLD, CHF and pacemaker placement who presents to the Emergency Department via EMS complaining of fall. Patient explains he had a mechanical fall just PTA tonight; he slipped on the stone floor while exiting the bathroom, bearing the brunt of the fall on his right side. He now complains of pain in his right hip, rated 7/10 at present; he was unable to stand and bear weight on the right leg after the fall. Patient's wife notes that his right leg is more swollen than the left at baseline. Wife states she gave patient 1 oxycodone for pain 30 minutes ago. He denies head injury, denies LOC.   Patient takes 75mg  Plavix daily.   Past Medical History  Diagnosis Date  . Back pain   . Hypertension   . Hypercholesteremia   . Hypothyroidism   . Stroke 02/2012    tia  . History of CHF (congestive heart failure)   . Hemorrhoids   . DDD (degenerative disc disease)     of the spine  . Kidney stones   . Nephritis     at age 81  . Bradycardia     with snycope  . SSS (sick sinus syndrome)     in setting of syncope s/p PPM  . CHF (congestive heart failure)   . Dysrhythmia   . GERD (gastroesophageal reflux disease)   . Cellulitis and abscess of leg 09/16/2013    RT LEG  . Macular degeneration     right eye   Past Surgical History  Procedure Laterality Date  . Knee surgery      left knee  . Joint replacement      Left knee replacement  . Insert / replace / remove pacemaker    . Permanent pacemaker insertion N/A 10/03/2011    Procedure: PERMANENT  PACEMAKER INSERTION;  Surgeon: Duke SalviaSteven C Klein, MD;  [facility];  Service: Cardiovascular;  Laterality: N/A;  . Temporary pacemaker insertion N/A 10/03/2011    Procedure: TEMPORARY PACEMAKER INSERTION;  Surgeon: Quintella Reichertraci R Turner, MD;  Location: [facility] CATH LAB;  Service: Cardiovascular;  Laterality: N/A;   Family History  Problem Relation Age of Onset  . Kidney disease Mother   . Kidney disease Father    History  Substance Use Topics  . Smoking status: Former Smoker    Types: Pipe  . Smokeless tobacco: Never Used  . Alcohol Use: 0.6 oz/week    1 Glasses of wine per week    Review of Systems  Constitutional: Negative.   Respiratory: Negative.  Negative for chest tightness and shortness of breath.   Cardiovascular: Negative.  Negative for chest pain.  Gastrointestinal: Negative.  Negative for abdominal pain.  Genitourinary: Negative.  Negative for dysuria.  Musculoskeletal: Negative for back pain.       Hip pain  Skin: Negative for wound.  Neurological: Negative for headaches.  Psychiatric/Behavioral: Negative for confusion.  All other systems reviewed and are negative.  Allergies  Sulfa antibiotics  Home Medications   Prior to Admission medications   Medication Sig Start Date End Date Taking? Authorizing Provider  amLODipine (NORVASC) 5 MG tablet Take 1 tablet (5 mg total) by mouth daily. 06/04/14  Yes Quintella Reichert, MD  Ascorbic Acid (VITAMIN C) 1000 MG tablet Take 1,000 mg by mouth daily.   Yes Historical Provider, MD  clopidogrel (PLAVIX) 75 MG tablet TAKE ONE (1) TABLET BY MOUTH EVERY DAY 04/15/14  Yes Margaree Mackintosh, MD  Diphenhydramine-APAP, sleep, (TYLENOL PM EXTRA STRENGTH PO) Take 2 tablets by mouth at bedtime as needed (for sleep).   Yes Historical Provider, MD  HYDROcodone-acetaminophen (NORCO/VICODIN) 5-325 MG per tablet Take 1 tablet by mouth 3 (three) times daily as needed for moderate pain. 05/12/14  Yes Margaree Mackintosh, MD  levofloxacin (LEVAQUIN) 500 MG  tablet Take 0.5 tablets by mouth daily. 06/16/14  Yes Historical Provider, MD  levothyroxine (SYNTHROID, LEVOTHROID) 100 MCG tablet TAKE ONE (1) TABLET BY MOUTH EVERY DAY 04/21/14  Yes Margaree Mackintosh, MD  Multiple Vitamins-Minerals (MULTIVITAMINS THER. W/MINERALS) TABS Take 1 tablet by mouth daily.   Yes Historical Provider, MD  mupirocin ointment (BACTROBAN) 2 % Apply 1 application topically 2 (two) times daily. Patient taking differently: Apply 1 application topically 2 (two) times daily as needed (rash).  09/30/13  Yes Margaree Mackintosh, MD  omeprazole (PRILOSEC) 20 MG capsule Take 1 capsule (20 mg total) by mouth daily. 04/22/14  Yes Margaree Mackintosh, MD  PARoxetine (PAXIL) 10 MG tablet Take 1 tablet (10 mg total) by mouth daily. 01/06/14  Yes Margaree Mackintosh, MD  pravastatin (PRAVACHOL) 40 MG tablet Take 40 mg by mouth daily.   Yes Historical Provider, MD  Sennosides (EX-LAX) 15 MG TABS Take 15 mg by mouth at bedtime as needed. For constipation   Yes Historical Provider, MD  tamsulosin (FLOMAX) 0.4 MG CAPS capsule TAKE ONE CAPSULE BY MOUTH DAILY 05/27/14  Yes Margaree Mackintosh, MD  triamcinolone cream (KENALOG) 0.1 % Apply 1 application topically daily as needed (itching).  11/13/13  Yes Historical Provider, MD   BP 111/54 mmHg  Pulse 89  Temp(Src) 97.7 F (36.5 C) (Oral)  Resp 17  Ht  (1.626 m)  Wt 133 lb (60.328 kg)  BMI 22.82 kg/m2  SpO2 98% Physical Exam  Constitutional: He is oriented to person, place, and time. No distress.  Appears younger than stated age  HENT:  Head: Normocephalic and atraumatic.  Eyes: Pupils are equal, round, and reactive to light.  Neck: Normal range of motion.  Cardiovascular: Normal rate, regular rhythm and normal heart sounds.   No murmur heard. Pulmonary/Chest: Effort normal and breath sounds normal. No respiratory distress. He has no wheezes.  Abdominal: Soft. There is no tenderness.  Musculoskeletal: He exhibits no edema.  Tenderness to palpation over the  right greater trochanter and right hip, range of motion limited secondary to pain, no foreshortening noted, right greater than left lower extremity edema with 2+ DP pulses  Neurological: He is alert and oriented to person, place, and time.  Skin: Skin is warm and dry.  Mild erythema noted to the right shin  Psychiatric: He has a normal mood and affect.  Nursing note and vitals reviewed.   ED Course  Procedures   DIAGNOSTIC STUDIES: Oxygen Saturation is 99% on RA, normal by my interpretation.    COORDINATION OF CARE: 2:19 AM Discussed treatment plan with pt at bedside and pt agreed to plan.  Labs Review Labs Reviewed  CBC WITH DIFFERENTIAL/PLATELET - Abnormal; Notable for the following:    RBC 2.52 (*)    Hemoglobin 8.4 (*)    HCT 24.8 (*)    Neutrophils Relative % 81 (*)    Neutro Abs 8.5 (*)    Lymphocytes Relative 10 (*)    All other components within normal limits  BASIC METABOLIC PANEL - Abnormal; Notable for the following:    Glucose, Bld 110 (*)    BUN 43 (*)    Creatinine, Ser 1.82 (*)    GFR calc non Af Amer 30 (*)    GFR calc Af Amer 34 (*)    All other components within normal limits    Imaging Review Dg Hip Unilat With Pelvis 1v Right  06/24/2014   CLINICAL DATA:  Larey Seat tonight.  EXAM: RIGHT HIP (WITH PELVIS) 1 VIEW  COMPARISON:  None.  FINDINGS: There are fractures of the superior and inferior right pubic rami. There is a fracture of the right greater trochanter. There is no evidence of a fracture line extending into the femoral neck. Acetabulum appears intact.  IMPRESSION: Right pubic ramus fractures.  Right greater trochanter fracture.   Electronically Signed   By: Ellery Plunk M.D.   On: 06/24/2014 03:08     EKG Interpretation   Date/Time:  Tuesday June 24 2014 02:13:27 EDT Ventricular Rate:  88 PR Interval:    QRS Duration: 129 QT Interval:  372 QTC Calculation: 450 R Axis:   -75 Text Interpretation:  Atrial fibrillation vs ectopic atrial rhythm  RBBB  and LAFB ST elevation, consider inferior injury Confirmed by Thersa Mohiuddin  MD,  Knoah Nedeau ([id number]) on 06/24/2014 2:20:58 AM      MDM   Final diagnoses:  Fall  Fracture of multiple pubic rami, right, closed, initial encounter  Greater trochanter fracture, right, closed, initial encounter   Patient presents following a mechanical fall. Nontoxic on exam. Awake, alert, oriented and ABCs intact. No obvious signs of trauma. Patient does have tenderness to palpation of the right hip and greater trochanter. Plain films reveal right pubic ramus fractures as well as a right greater trochanteric fracture. Discussed with orthopedics, Dr. Ophelia Charter. Injuries are weightbearing as tolerated. Patient reports significant pain with weightbearing. Given age and fall risk with additional pain medications, will admit for obsessive physical therapy evaluation.  I personally performed the services described in this documentation, which was scribed in my presence. The recorded information has been reviewed and is accurate.      Shon Baton, MD 06/24/14 [phone]

## 2014-06-24 NOTE — Discharge Planning (Signed)
[facility]. Patient's wife updated regarding discharge disposition.  [facility]: [id number]  [facility] Report number: [phone] [address]: [address])  Marcelline DeistEmily Jaimon Bugaj, LCSWA [phone]) Licensed Clinical Social Worker Orthopedics [phone]([id number]) and Surgical [phone]([id number])

## 2014-06-24 NOTE — Progress Notes (Signed)
Patient ID: Wende NeighborsFritz Weaver, male   DOB: July 07, 1915, 81 y.o.   MRN: [id number]   Patient is stable from ortho standpoint to discharge when medically cleared by hospitalist.

## 2014-06-24 NOTE — Progress Notes (Signed)
Patient ID: John NeighborsFritz Laforge, male   DOB: Jul 31, 1915, 81 y.o.   MRN: [id number]  Venous doppler right le was negative for DVT.  Ordered SCD's and knee hi ted hose.

## 2014-06-24 NOTE — Clinical Social Work Placement (Signed)
Clinical Social Work Department CLINICAL SOCIAL WORK PLACEMENT NOTE 06/24/2014  Patient:  John Weaver,John Weaver  Account Number:  [id number] Admit date:  06/24/2014  Clinical Social Worker:  Hortencia PilarKIERRA WILEY, CLINICAL SOCIAL WORKER  Date/time:  06/24/2014 11:54 AM  Clinical Social Work is seeking post-discharge placement for this patient at the following level of care:   SKILLED NURSING   (*CSW will update this form in Epic as items are completed)   06/24/2014  Patient/family provided with [facility] of Clinical Social Work's list of facilities offering this level of care within the geographic area requested by the patient (or if unable, by the patient's family).  06/24/2014  Patient/family informed of their freedom to choose among providers that offer the needed level of care, that participate in Medicare, Medicaid or managed care program needed by the patient, have an available bed and are willing to accept the patient.  06/24/2014  Patient/family informed of [facility]' ownership interest in [facility], as well as of the fact that they are under no obligation to receive care at this facility.  PASARR submitted to EDS on 06/24/2014 PASARR number received on 06/24/2014  FL2 transmitted to all facilities in geographic area requested by pt/family on  06/24/2014 FL2 transmitted to all facilities within larger geographic area on   Patient informed that his/her managed care company has contracts with or will negotiate with  certain facilities, including the following:     Patient/family informed of bed offers received:  06/24/2014 [facility] Physician recommends and patient chooses bed at    [facility]  06/24/2014 Patient to be transferred to facility by [facility] Patient and family notified of transfer on 06/24/2014 Name of family member notified:  Patient's wife, Hale Bogusorter,  updated.  The following physician request were entered in Epic:   Additional Comments:   Kierra S. [address], BSW-Intern   Marcelline Deistmily Margareth Kanner, [place] [phone]) Licensed Clinical Social Worker Orthopedics [phone]([id number]) and Surgical [phone]([id number])

## 2014-06-24 NOTE — ED Notes (Signed)
Pt states that he hit his head. Pt's wife states that she gave him on oxycodone approx 30 minutes ago.   Horton, MD at bedside.

## 2014-06-24 NOTE — Evaluation (Signed)
Physical Therapy Evaluation Patient Details Name: John Weaver MRN: [id number] DOB: [birth year] Today's Date: 06/24/2014   History of Present Illness  81 y/o male admitted with R sided pubic rami fx and R greater trochanteric fracture which is being treated non-surgically with NWB.  Clinical Impression  Pt admitted with above diagnosis. Pt currently with functional limitations due to the deficits listed below (see PT Problem List).  Pt will benefit from skilled PT to increase their independence and safety with mobility to allow discharge to the venue listed below.  Considering age and multiple fractures, pt moves extremely well and was able to maintain NWB R LE in static standing with MIN  A at RW. Pt was very cooperative throughout treatment and laughing and worked well with PT. Pt would benefit from short term SNF for continued rehab to work on increasing mobility at R LE NWB level before returning home to wife.     Follow Up Recommendations SNF    Equipment Recommendations  None recommended by PT    Recommendations for Other Services       Precautions / Restrictions Precautions Precautions: Fall Restrictions Weight Bearing Restrictions: Yes RLE Weight Bearing: Non weight bearing Other Position/Activity Restrictions: per Dr. Katherene Ponto notes from discussion with Dr. Ophelia Charter.      Mobility  Bed Mobility Overal bed mobility: Needs Assistance Bed Mobility: Supine to Sit;Sit to Supine     Supine to sit: HOB elevated;Min assist Sit to supine: Min assist;+2 for physical assistance   General bed mobility comments: Pt able to initiate movement to L side of bed and needed help only to hold R leg near end of movement.  Grimacing and cues for breathing.  Transfers Overall transfer level: Needs assistance Equipment used: Rolling walker (2 wheeled) Transfers: Sit to/from Stand Sit to Stand: +2 physical assistance;Min assist         General transfer comment: Stood from bed with  A to keep weight off of R LE intially.  Once in standing he was able to stand with R LE NWB.  Pt very pleasant, but unable to attempt hopping.  Sitting EOB, pt able to scoot self to [facility] with PT holding R LE to prevent WB.  R LE swollen, Nursing states MD just requested doppler this AM.   Ambulation/Gait                Stairs            Wheelchair Mobility    Modified Rankin (Stroke Patients Only)       Balance Overall balance assessment: Needs assistance Sitting-balance support: Bilateral upper extremity supported Sitting balance-Leahy Scale: Good     Standing balance support: Bilateral upper extremity supported Standing balance-Leahy Scale: Poor Standing balance comment: requires RW for NWB                             Pertinent Vitals/Pain Pain Assessment: Faces Faces Pain Scale: Hurts whole lot Pain Location: R groin Pain Intervention(s): Limited activity within patient's tolerance;Monitored during session;Repositioned    Home Living Family/patient expects to be discharged to:: Skilled nursing facility Living Arrangements: Spouse/significant other               Additional Comments: Lives in [facility] [place] apartments on [address]    Prior Function Level of Independence: Independent with assistive device(s)         Comments: AMb with RW     Hand Dominance  Extremity/Trunk Assessment               Lower Extremity Assessment: RLE deficits/detail         Communication   Communication: No difficulties  Cognition Arousal/Alertness: Awake/alert Behavior During Therapy: WFL for tasks assessed/performed Overall Cognitive Status: Within Functional Limits for tasks assessed                      General Comments General comments (skin integrity, edema, etc.): Pt cooperative and willing to do anything PT asked.  He was unable to use 0-10 pain scale, but grimacing through movement, but did not verbally complain. Deferred  therex as nursing reports doppler pending.    Exercises        Assessment/Plan    PT Assessment Patient needs continued PT services  PT Diagnosis Difficulty walking   PT Problem List Decreased strength;Decreased range of motion;Decreased balance;Decreased mobility;Decreased knowledge of use of DME  PT Treatment Interventions Gait training;Functional mobility training;Therapeutic activities;Therapeutic exercise;Balance training;DME instruction   PT Goals (Current goals can be found in the Care Plan section) Acute Rehab PT Goals Patient Stated Goal: Pt agreeable to work with PT. PT Goal Formulation: With patient Time For Goal Achievement: 07/01/14 Potential to Achieve Goals: Good    Frequency Min 4X/week   Barriers to discharge        Co-evaluation               End of Session Equipment Utilized During Treatment: Gait belt Activity Tolerance: Patient tolerated treatment well Patient left: in bed;with call bell/phone within reach Nurse Communication: Mobility status;Weight bearing status         Time: [id number] PT Time Calculation (min) (ACUTE ONLY): 15 min   Charges:   PT Evaluation $Initial PT Evaluation Tier I: 1 Procedure     PT G Codes:        Shakeeta Godette LUBECK 06/24/2014, 10:13 AM

## 2014-06-24 NOTE — Clinical Social Work Psychosocial (Signed)
Clinical Social Work Department BRIEF PSYCHOSOCIAL ASSESSMENT 06/24/2014  Patient:  CHAYCE, ROBBINS     Account Number:  [id number]     Admit date:  06/24/2014  Clinical Social Worker:  Durward Fortes, CLINICAL SOCIAL WORKER  Date/Time:  06/24/2014 11:43 AM  Referred by:  Physician  Date Referred:  06/24/2014 Referred for  SNF Placement   Other Referral:   none.   Interview type:  Family Other interview type:   none.    PSYCHOSOCIAL DATA Living Status:  WIFE Admitted from facility:   Level of care:   Primary support name:  Carmie Kanner Primary support relationship to patient:  SPOUSE Degree of support available:   Adequate support.    CURRENT CONCERNS Current Concerns  Post-Acute Placement   Other Concerns:   none.    SOCIAL WORK ASSESSMENT / PLAN CSW and BSW-Intern consulted regarding possible SNF placement for pt once medically stable for discharge.    BSW-Intern went to meet with pt at bedside, however pt had been taken to get other test performed. BSW-Intern met with pt's wife(Porter) at bedside to discuss further plans for pt at time of discharge.    BSW-Intern informed pt's wife of the idea of pt going to a SNF after discharge for further therapy. Pt's wife suggested that she wanted a SNF that was close to [facility], therefore pt's wife chose a bed at [facility].    Pt's wife was very pleased with this choice and is looking foward to pts return back home with her and family.    CSW and BSW-Intern to continue to assist with discharge planning needs.   Assessment/plan status:  Psychosocial Support/Ongoing Assessment of Needs Other assessment/ plan:   none.   Information/referral to community resources:   Pt to be discharged to [facility] in [place].    PATIENT'S/FAMILY'S RESPONSE TO PLAN OF CARE: Pt and pt's family agreeable to CSW plan of care. Pt and pt's family expressed no further concerns at this time.       Virgie DadWiley,  BSW-Intern

## 2014-06-24 NOTE — Progress Notes (Signed)
Patient seen and examined, vital stable, pleasant, reported accidental fall. Review lab showed leukocytosis, no fever, leukocytosis likely stress related, but will check UA, patient also has chronic right lower extremity cellulitis, reported finished abx treatment, much better, on exam, mild erythema anterior right shin, nontender. Continue pain control/PT/discharge planning, F/u UA result.

## 2014-06-24 NOTE — Telephone Encounter (Signed)
Hale Bogusorter called and said that [facility] was being [facility].  Best number to call Hale Bogusorter is [phone] / lt

## 2014-06-25 ENCOUNTER — Telehealth: Payer: Self-pay | Admitting: Internal Medicine

## 2014-06-25 NOTE — Telephone Encounter (Signed)
Wife calls stating patient is now in [facility].  She is very happy with him there.  States she like the OT he is getting.  State that he is to get PT today.  She wants to know if you will look at the x-rays that were taken at the hospital of his fractures.  She trusts you and wants to know your opinions and your recommendation and would like for you to call the PT [facility] and provide any recommendation to PT regarding his fractures.  She knows it's a LOT to ask of you, but she values your input and would appreciate your doing this for her and John Weaver.    Please advise.

## 2014-06-25 NOTE — Telephone Encounter (Signed)
Dr. Ophelia CharterYates has reviewed the films. There is not much to do but let the fractures heal and work gradually on weight bearing.

## 2014-06-25 NOTE — Telephone Encounter (Signed)
Spoke with Hale BogusPorter reviewed information from Dr Lenord FellersBaxley

## 2014-06-26 ENCOUNTER — Encounter: Payer: Self-pay | Admitting: Internal Medicine

## 2014-06-26 ENCOUNTER — Non-Acute Institutional Stay (SKILLED_NURSING_FACILITY): Payer: Medicare Other | Admitting: Internal Medicine

## 2014-06-26 DIAGNOSIS — I10 Essential (primary) hypertension: Secondary | ICD-10-CM

## 2014-06-26 DIAGNOSIS — D649 Anemia, unspecified: Secondary | ICD-10-CM | POA: Diagnosis not present

## 2014-06-26 DIAGNOSIS — E038 Other specified hypothyroidism: Secondary | ICD-10-CM | POA: Diagnosis not present

## 2014-06-26 DIAGNOSIS — D72829 Elevated white blood cell count, unspecified: Secondary | ICD-10-CM | POA: Insufficient documentation

## 2014-06-26 DIAGNOSIS — E785 Hyperlipidemia, unspecified: Secondary | ICD-10-CM

## 2014-06-26 DIAGNOSIS — N183 Chronic kidney disease, stage 3 unspecified: Secondary | ICD-10-CM

## 2014-06-26 DIAGNOSIS — I495 Sick sinus syndrome: Secondary | ICD-10-CM

## 2014-06-26 DIAGNOSIS — S329XXD Fracture of unspecified parts of lumbosacral spine and pelvis, subsequent encounter for fracture with routine healing: Secondary | ICD-10-CM | POA: Diagnosis not present

## 2014-06-26 DIAGNOSIS — E034 Atrophy of thyroid (acquired): Secondary | ICD-10-CM | POA: Diagnosis not present

## 2014-06-26 LAB — URINE CULTURE: Colony Count: 5000

## 2014-06-26 NOTE — Assessment & Plan Note (Signed)
Hb 8.7 reported to be at baseline

## 2014-06-26 NOTE — Assessment & Plan Note (Signed)
Cr at baseline 

## 2014-06-26 NOTE — Assessment & Plan Note (Signed)
Continue pravachol 40mg  °

## 2014-06-26 NOTE — Assessment & Plan Note (Signed)
1. status post pacemaker placement

## 2014-06-26 NOTE — Assessment & Plan Note (Signed)
Controlled on norvasc 

## 2014-06-26 NOTE — Assessment & Plan Note (Signed)
1. Mild Leukocytosis: vital stable, patient nontoxic appearing, no cough, no fever, denies urinary symptom, no diarrhea. reported resolving right lower extremity cellulitis with abx treatment, right lower extremity venous duplex negative for DVT. UA small leukocyte/few bacteria, review urine culture from 3/9 showed proteus sensitive to augmentin. patient is prescribed with Augmentin at discharge, pmd follow up urine culture and repeat cbc in one week. 2.

## 2014-06-26 NOTE — Assessment & Plan Note (Signed)
1. Right-sided pubic rami fractures with greater trochanter fracture - Dr. Ophelia CharterYates on call orthopedic surgeon was consulted and at this time no creates is advised nonsurgical management with nonweightbearing. Patient has significant pain on moving and has been admitted for pain management and physical therapy consult. orhto recommended medical management, cleared patient for discharge, social worker consulted, patient is to discharge to SNF for ongoing pain control/PT/OT.

## 2014-06-26 NOTE — Assessment & Plan Note (Signed)
Continue synthroid 100mcg

## 2014-06-26 NOTE — Progress Notes (Addendum)
MRN: [id number] Name: John Weaver  Sex: male Age: 81 y.o. DOB: 1915/10/04  [id number] Facility/Room:111 Level Of Care: SNF Provider: Merrilee Seashore D Emergency Contacts: Extended Emergency Contact Information Primary Emergency Contact: Aichele,Porter Address: [address]          [place] [address] [place] Home Phone: [phone] Mobile Phone: [phone] Relation: Spouse  Code Status: DNR  Allergies: Sulfa antibiotics  Chief Complaint  Patient presents with  . New Admit To SNF    HPI: Patient is 81 y.o. male who is is admitted to SNF for OT/PT for fall with fx of R sided pubic rami and R greater trochanter being treated nonsurgically.  Past Medical History  Diagnosis Date  . Back pain   . Hypertension   . Hypercholesteremia   . Hypothyroidism   . Stroke 02/2012    tia  . History of CHF (congestive heart failure)   . Hemorrhoids   . DDD (degenerative disc disease)     of the spine  . Kidney stones   . Nephritis     at age 56  . Bradycardia     with snycope  . SSS (sick sinus syndrome)     in setting of syncope s/p PPM  . CHF (congestive heart failure)   . Dysrhythmia   . GERD (gastroesophageal reflux disease)   . Cellulitis and abscess of leg 09/16/2013    RT LEG  . Macular degeneration     right eye    Past Surgical History  Procedure Laterality Date  . Knee surgery      left knee  . Joint replacement      Left knee replacement  . Insert / replace / remove pacemaker    . Permanent pacemaker insertion N/A 10/03/2011    Procedure: PERMANENT PACEMAKER INSERTION;  Surgeon: Duke Salvia, MD;  Location: [facility] CATH LAB;  Service: Cardiovascular;  Laterality: N/A;  . Temporary pacemaker insertion N/A 10/03/2011    Procedure: TEMPORARY PACEMAKER INSERTION;  Surgeon: Quintella Reichert, MD;  Location: [facility] CATH LAB;  Service: Cardiovascular;  Laterality: N/A;      Medication List       This list is accurate as of: 06/26/14 11:59 PM.   Always use your most recent med list.               amLODipine 5 MG tablet  Commonly known as:  NORVASC  Take 1 tablet (5 mg total) by mouth daily.     amoxicillin-clavulanate 875-125 MG per tablet  Commonly known as:  AUGMENTIN  Take 1 tablet by mouth 2 (two) times daily.     clopidogrel 75 MG tablet  Commonly known as:  PLAVIX  TAKE ONE (1) TABLET BY MOUTH EVERY DAY     EX-LAX 15 MG Tabs  Generic drug:  Sennosides  Take 15 mg by mouth at bedtime as needed. For constipation     HYDROcodone-acetaminophen 5-325 MG per tablet  Commonly known as:  NORCO/VICODIN  Take 1 tablet by mouth every 6 (six) hours as needed for moderate pain or severe pain.     levothyroxine 100 MCG tablet  Commonly known as:  SYNTHROID, LEVOTHROID  TAKE ONE (1) TABLET BY MOUTH EVERY DAY     multivitamins ther. w/minerals Tabs tablet  Take 1 tablet by mouth daily.     mupirocin ointment 2 %  Commonly known as:  BACTROBAN  Apply 1 application topically 2 (two) times daily.     omeprazole  20 MG capsule  Commonly known as:  PRILOSEC  Take 1 capsule (20 mg total) by mouth daily.     PARoxetine 10 MG tablet  Commonly known as:  PAXIL  Take 1 tablet (10 mg total) by mouth daily.     pravastatin 40 MG tablet  Commonly known as:  PRAVACHOL  Take 40 mg by mouth daily.     tamsulosin 0.4 MG Caps capsule  Commonly known as:  FLOMAX  TAKE ONE CAPSULE BY MOUTH DAILY     triamcinolone cream 0.1 %  Commonly known as:  KENALOG  Apply 1 application topically daily as needed (itching).     TYLENOL PM EXTRA STRENGTH PO  Take 2 tablets by mouth at bedtime as needed (for sleep).     vitamin C 1000 MG tablet  Take 1,000 mg by mouth daily.        No orders of the defined types were placed in this encounter.    Immunization History  Administered Date(s) Administered  . Influenza,inj,Quad PF,36+ Mos 01/16/2013, 02/04/2014  . Tdap 02/29/2012    History  Substance Use Topics  . Smoking status:  Former Smoker    Types: Pipe  . Smokeless tobacco: Never Used  . Alcohol Use: 0.6 oz/week    1 Glasses of wine per week    Family history is noncontributory    Review of Systems  DATA OBTAINED: from patient, nurse; no c/o GENERAL:  no fevers, fatigue, appetite changes SKIN: No itching, rash or wounds EYES: No eye pain, redness, discharge EARS: No earache, tinnitus, change in hearing NOSE: No congestion, drainage or bleeding  MOUTH/THROAT: No mouth or tooth pain, No sore throat RESPIRATORY: No cough, wheezing, SOB CARDIAC: No chest pain, palpitations, lower extremity edema  GI: No abdominal pain, No N/V/D or constipation, No heartburn or reflux  GU: No dysuria, frequency or urgency, or incontinence  MUSCULOSKELETAL: No unrelieved bone/joint pain NEUROLOGIC: No headache, dizziness or focal weakness PSYCHIATRIC: No overt anxiety or sadness, No behavior issue.   Filed Vitals:   06/29/14 1802  BP: 119/60  Pulse: 98  Temp: 97.7 F (36.5 C)  Resp: 20    Physical Exam  GENERAL APPEARANCE: Alert, conversant, very pleasant WM No acute distress.  SKIN: No diaphoresis rash HEAD: Normocephalic, atraumatic  EYES: Conjunctiva/lids clear. Pupils round, reactive. EOMs intact.  EARS: External exam WNL, canals clear. Hearing grossly normal.  NOSE: No deformity or discharge.  MOUTH/THROAT: Lips w/o lesions  RESPIRATORY: Breathing is even, unlabored. Lung sounds are clear   CARDIOVASCULAR: Heart irreg, 3/6 murmur, no rubs or gallops. No peripheral edema.   GASTROINTESTINAL: Abdomen is soft, non-tender, not distended w/ normal bowel sounds. GENITOURINARY: Bladder non tender, not distended  MUSCULOSKELETAL: No abnormal joints or musculature,R leg without excessive soreness or edema NEUROLOGIC:  Cranial nerves 2-12 grossly intact.  PSYCHIATRIC: Mood and affect appropriate to situation, no behavioral issues  Patient Active Problem List   Diagnosis Date Noted  . Leukocytosis 06/26/2014   . Multiple pelvic fractures 06/24/2014  . Chronic anemia 06/24/2014  . Pelvic fracture 06/24/2014  . Atrial fibrillation 06/17/2014  . Urinary incontinence 03/02/2014  . Cellulitis 09/16/2013  . RBBB 04/24/2013  . HTN (hypertension) 04/24/2013  . Carotid bruit 04/24/2013  . SSS (sick sinus syndrome)   . Syncope 04/02/2013  . Hyperlipidemia 04/05/2012  . History of TIA (transient ischemic attack) 04/05/2012  . CKD (chronic kidney disease) stage 3, GFR 30-59 ml/min 02/04/2012  . Musculoskeletal pain 02/04/2012  . Dependent edema  02/04/2012  . Macular degeneration 02/02/2012  . Aortic stenosis 10/03/2011  . Pacemaker-St.Jude 10/03/2011  . Hypothyroidism 10/01/2011    CBC    Component Value Date/Time   WBC 13.5* 06/24/2014 0746   RBC 2.64* 06/24/2014 0746   RBC 3.23* 10/02/2011 0816   HGB 8.7* 06/24/2014 0746   HCT 26.2* 06/24/2014 0746   PLT 263 06/24/2014 0746   MCV 99.2 06/24/2014 0746   LYMPHSABS 1.7 06/24/2014 0746   MONOABS 1.3* 06/24/2014 0746   EOSABS 0.1 06/24/2014 0746   BASOSABS 0.0 06/24/2014 0746    CMP     Component Value Date/Time   NA 137 06/24/2014 0746   K 4.5 06/24/2014 0746   CL 108 06/24/2014 0746   CO2 23 06/24/2014 0746   GLUCOSE 128* 06/24/2014 0746   BUN 41* 06/24/2014 0746   CREATININE 1.90* 06/24/2014 0746   CREATININE 1.55* 10/24/2013 1020   CALCIUM 9.1 06/24/2014 0746   PROT 6.3 06/24/2014 0746   ALBUMIN 3.5 06/24/2014 0746   AST 30 06/24/2014 0746   ALT 21 06/24/2014 0746   ALKPHOS 115 06/24/2014 0746   BILITOT 1.0 06/24/2014 0746   GFRNONAA 28* 06/24/2014 0746   GFRAA 33* 06/24/2014 0746    Assessment and Plan  Pelvic fracture 1. Right-sided pubic rami fractures with greater trochanter fracture - Dr. Ophelia CharterYates on call orthopedic surgeon was consulted and at this time no creates is advised nonsurgical management with nonweightbearing. Patient has significant pain on moving and has been admitted for pain management and physical  therapy consult. orhto recommended medical management, cleared patient for discharge, social worker consulted, patient is to discharge to SNF for ongoing pain control/PT/OT.   SSS (sick sinus syndrome) 2. status post pacemaker placement   HTN (hypertension) Controlled on norvasc   Hyperlipidemia Continue pravachol 40 mg   Hypothyroidism Continue synthroid 100mcg   CKD (chronic kidney disease) stage 3, GFR 30-59 ml/min Cr at baseline   Chronic anemia Hb 8.7 reported to be at baseline   Leukocytosis 3. Mild Leukocytosis: vital stable, patient nontoxic appearing, no cough, no fever, denies urinary symptom, no diarrhea. reported resolving right lower extremity cellulitis with abx treatment, right lower extremity venous duplex negative for DVT. UA small leukocyte/few bacteria, review urine culture from 3/9 showed proteus sensitive to augmentin. patient is prescribed with Augmentin at discharge, pmd follow up urine culture and repeat cbc in one week. 4.      Margit HanksALEXANDER, Avalene Sealy D, MD

## 2014-06-27 ENCOUNTER — Non-Acute Institutional Stay (SKILLED_NURSING_FACILITY): Payer: Medicare Other | Admitting: Nurse Practitioner

## 2014-06-27 DIAGNOSIS — D649 Anemia, unspecified: Secondary | ICD-10-CM

## 2014-06-27 DIAGNOSIS — R58 Hemorrhage, not elsewhere classified: Secondary | ICD-10-CM | POA: Diagnosis not present

## 2014-06-27 NOTE — Progress Notes (Signed)
Patient ID: John Weaver, male   DOB: 27-Mar-1916, 81 y.o.   MRN: [id number]    Nursing Home Location:  [facility]   Place of Service: SNF (31)  PCP: Margaree Mackintosh, MD  Allergies  Allergen Reactions  . Sulfa Antibiotics Rash    Chief Complaint  Patient presents with  . Acute Visit    HPI:  Patient is a 81 y.o. male seen today at [facility] after bruising was noted on his scrotum after peri care. He was seen yesterday by Dr. [name] for admission.  Patient's wife noted bruising last night after peri care.  Today he is seen in his room.  He is alert and pleasant and in no apparent distress.  He does not report any pain in his scrotal area or low back pain.  He had a fall on 06/24/14, he was seen in the ED for a fall.  He sustained right sided pubic rami fractures with fracture of the right greater trochanter.  Ortho saw him in the hospital and advised non surgical management.  He was then transferred to this facility for PT and rehab.    Review of Systems:  Review of Systems  Constitutional: Negative for fever, chills and fatigue.  HENT: Negative.   Respiratory: Negative for cough, shortness of breath and wheezing.   Cardiovascular: Positive for leg swelling. Negative for chest pain and palpitations.  Gastrointestinal: Negative for abdominal pain and abdominal distention.  Genitourinary: Negative for frequency, flank pain, penile swelling, scrotal swelling, penile pain and testicular pain.  Musculoskeletal: Positive for back pain (Patient reports chronic back pain ).  Skin: Positive for color change (on scrotal area. ).  Neurological: Negative for dizziness, light-headedness and headaches.    Past Medical History  Diagnosis Date  . Back pain   . Hypertension   . Hypercholesteremia   . Hypothyroidism   . Stroke 02/2012    tia  . History of CHF (congestive heart failure)   . Hemorrhoids   . DDD (degenerative disc disease)     of the spine  .  Kidney stones   . Nephritis     at age 8  . Bradycardia     with snycope  . SSS (sick sinus syndrome)     in setting of syncope s/p PPM  . CHF (congestive heart failure)   . Dysrhythmia   . GERD (gastroesophageal reflux disease)   . Cellulitis and abscess of leg 09/16/2013    RT LEG  . Macular degeneration     right eye   Past Surgical History  Procedure Laterality Date  . Knee surgery      left knee  . Joint replacement      Left knee replacement  . Insert / replace / remove pacemaker    . Permanent pacemaker insertion N/A 10/03/2011    Procedure: PERMANENT PACEMAKER INSERTION;  Surgeon: Duke Salvia, MD;  Location: [facility] CATH LAB;  Service: Cardiovascular;  Laterality: N/A;  . Temporary pacemaker insertion N/A 10/03/2011    Procedure: TEMPORARY PACEMAKER INSERTION;  Surgeon: Quintella Reichert, MD;  Location: [facility] CATH LAB;  Service: Cardiovascular;  Laterality: N/A;   Social History:   reports that he has quit smoking. His smoking use included Pipe. He has never used smokeless tobacco. He reports that he drinks about 0.6 oz of alcohol per week. He reports that he does not use illicit drugs.  Family History  Problem Relation Age of Onset  . Kidney disease  Mother   . Kidney disease Father     Medications: Patient's Medications  New Prescriptions   No medications on file  Previous Medications   AMLODIPINE (NORVASC) 5 MG TABLET    Take 1 tablet (5 mg total) by mouth daily.   AMOXICILLIN-CLAVULANATE (AUGMENTIN) 875-125 MG PER TABLET    Take 1 tablet by mouth 2 (two) times daily.   ASCORBIC ACID (VITAMIN C) 1000 MG TABLET    Take 1,000 mg by mouth daily.   CLOPIDOGREL (PLAVIX) 75 MG TABLET    TAKE ONE (1) TABLET BY MOUTH EVERY DAY   DIPHENHYDRAMINE-APAP, SLEEP, (TYLENOL PM EXTRA STRENGTH PO)    Take 2 tablets by mouth at bedtime as needed (for sleep).   HYDROCODONE-ACETAMINOPHEN (NORCO/VICODIN) 5-325 MG PER TABLET    Take 1 tablet by mouth every 6 (six) hours as needed for moderate  pain or severe pain.   LEVOTHYROXINE (SYNTHROID, LEVOTHROID) 100 MCG TABLET    TAKE ONE (1) TABLET BY MOUTH EVERY DAY   MULTIPLE VITAMINS-MINERALS (MULTIVITAMINS THER. W/MINERALS) TABS    Take 1 tablet by mouth daily.   MUPIROCIN OINTMENT (BACTROBAN) 2 %    Apply 1 application topically 2 (two) times daily.   OMEPRAZOLE (PRILOSEC) 20 MG CAPSULE    Take 1 capsule (20 mg total) by mouth daily.   PAROXETINE (PAXIL) 10 MG TABLET    Take 1 tablet (10 mg total) by mouth daily.   PRAVASTATIN (PRAVACHOL) 40 MG TABLET    Take 40 mg by mouth daily.   SENNOSIDES (EX-LAX) 15 MG TABS    Take 15 mg by mouth at bedtime as needed. For constipation   TAMSULOSIN (FLOMAX) 0.4 MG CAPS CAPSULE    TAKE ONE CAPSULE BY MOUTH DAILY   TRIAMCINOLONE CREAM (KENALOG) 0.1 %    Apply 1 application topically daily as needed (itching).   Modified Medications   No medications on file  Discontinued Medications   No medications on file     Physical Exam: Filed Vitals:   06/27/14 1035  BP: 134/77  Pulse: 90  Temp: 97 F (36.1 C)  Resp: 18    Physical Exam  Constitutional: He appears well-developed. No distress.  HENT:  Head: Normocephalic and atraumatic.  Eyes: Conjunctivae are normal. Pupils are equal, round, and reactive to light.  Cardiovascular: Normal rate and regular rhythm.   Murmur heard. Pacemaker present  Pulmonary/Chest: Effort normal and breath sounds normal.  Abdominal: Soft. He exhibits no distension. There is no tenderness.  Genitourinary:  Ecchymosis noted beginning at the base of the penis extends into scrotum and perineum, but does not extend to rectum.   No pain upon palpation, no swelling. Pt with hx of chronic pain in back, Patient does not have new low back pain.     Skin: Skin is warm and dry. No rash noted. He is not diaphoretic. No erythema.  Psychiatric: He has a normal mood and affect.    Labs reviewed: Basic Metabolic Panel:  Recent Labs  40/98/11 0902 06/24/14 0327  06/24/14 0746  NA 140 138 137  K 4.4 4.6 4.5  CL 114* 108 108  CO2 GLUCOSE 148* 110* 128*  BUN 43* 43* 41*  CREATININE 1.70* 1.82* 1.90*  CALCIUM 9.4 9.1 9.1   Liver Function Tests:  Recent Labs  07/30/13 0907 09/16/13 1336 06/24/14 0746  AST 37 39* 30  ALT ALKPHOS 75 104 115  BILITOT 0.8 0.4 1.0  PROT 6.1 7.0  6.3  ALBUMIN 3.9 3.8 3.5   No results for input(s): LIPASE, AMYLASE in the last 8760 hours. No results for input(s): AMMONIA in the last 8760 hours. CBC:  Recent Labs  06/06/14 1313 06/24/14 0327 06/24/14 0746  WBC 7.4 10.5 13.5*  NEUTROABS 5.1 8.5* 10.3*  HGB 9.4* 8.4* 8.7*  HCT 27.9* 24.8* 26.2*  MCV 103.0* 98.4 99.2  PLT 205 239 263   TSH:  Recent Labs  07/30/13 0907  TSH 2.766   A1C: Lab Results  Component Value Date   HGBA1C 4.9 02/17/2012   Lipid Panel:  Recent Labs  07/30/13 0907  CHOL 145  HDL 55  LDLCALC 76  TRIG 71  CHOLHDL 2.6    Radiological Exams: Dg Hip Unilat With Pelvis 1v Right  06/24/2014   CLINICAL DATA:  Larey SeatFell tonight.  EXAM: RIGHT HIP (WITH PELVIS) 1 VIEW  COMPARISON:  None.  FINDINGS: There are fractures of the superior and inferior right pubic rami. There is a fracture of the right greater trochanter. There is no evidence of a fracture line extending into the femoral neck. Acetabulum appears intact.  IMPRESSION: Right pubic ramus fractures.  Right greater trochanter fracture.   Electronically Signed   By: Ellery Plunkaniel R Mitchell M.D.   On: 06/24/2014 03:08   Assessment/Plan  1. Ecchymosis  Most likely residual blood from recent fracture.  No signs of acute bleed. No swelling or tenderness  Will obtain follow up CBC on 06/30/14 to evaluate hemoglobin.   2. Anemia Hgb up in discharge from hospital was 8.7.  Will obtain TIBC, ferritin, percent saturation, and iron level.

## 2014-06-29 ENCOUNTER — Encounter: Payer: Self-pay | Admitting: Internal Medicine

## 2014-06-30 ENCOUNTER — Telehealth: Payer: Self-pay | Admitting: Internal Medicine

## 2014-06-30 NOTE — Telephone Encounter (Signed)
Wife showed up at the office @ 8:35 this a.m. Beating on the door demanding to leave a note for Dr. Lenord FellersBaxley.  Rosalita ChessmanSuzanne let her in and she left a note suggesting her concern that John Weaver hasn't seen a physician.   Stated his scrotum is bright red - almost bruised looking.  Stated that he needs to be checked for an infection.  Also, when will he have blood tests to determine if the UTI is under control?  I thinks his assigned physician is a woman named Alexander.    I called [facility] and was advised that Dr. Florencia ReasonsAnn Alexander is in fact the physician assigned to John Weaver.  They advised me to contact her office as to when he may be seen.  I spoke with Eunice Blaseebbie at [facility].  She advised that Dr. Lyn HollingsheadAlexander does work for their office, however she works out of [facility] facilities ONLY.  She makes her schedules there and doesn't do anything there in the office.  Therefore, we would need to contact [facility] back to find out the scheduling as to when John Weaver is scheduled to be seen again by Dr. Lyn HollingsheadAlexander.  Eunice BlaseDebbie was able to tell me that Dr. Lyn HollingsheadAlexander DID see John Weaver on Thursday, 3/24 and Shanda BumpsJessica, their NP seen him on Friday, 3/25.  Advised I would call back over to [facility] and request that Dr. Lyn HollingsheadAlexander see the patient ASAP.    Spoke with Thayer Ohmhris at [facility] and advised of the information that I had learned (that Dr. Lyn HollingsheadAlexander actually made her OWN schedule and that it was done OUT OF [facility]), so I would like to speak with whomever does her scheduling for her.  He finally came back and apologized and told me that she is actually in the facility today and he'll be happy to have Dr. Lyn HollingsheadAlexander see John Weaver today.  I told him I felt that was imperative that she see the patient today related to the wife's note and the symptoms that she was describing for her husband, John Weaver.  He advised that he would get the message to Dr. Lyn HollingsheadAlexander ASAP.    Left extensive voicemail on [facility]'s voicemail to this  regard.  She is to call us back should she have ANY questions.  She was instructed to contact the staff and request to see Dr. Kandy GarrisonAlexander ANY time that felt he needed to be seen.  Explaind that Dr. Lyn HollingsheadAlexander is employed by [facility] but does ALL of her booking of appointments there out of [facility].  Hale Bogusorter is to call me should she have ANY questions in this regard.

## 2014-07-01 ENCOUNTER — Non-Acute Institutional Stay (SKILLED_NURSING_FACILITY): Payer: Medicare Other | Admitting: Nurse Practitioner

## 2014-07-01 DIAGNOSIS — D72829 Elevated white blood cell count, unspecified: Secondary | ICD-10-CM

## 2014-07-01 DIAGNOSIS — R58 Hemorrhage, not elsewhere classified: Secondary | ICD-10-CM

## 2014-07-01 DIAGNOSIS — D649 Anemia, unspecified: Secondary | ICD-10-CM | POA: Diagnosis not present

## 2014-07-01 DIAGNOSIS — R609 Edema, unspecified: Secondary | ICD-10-CM

## 2014-07-01 NOTE — Progress Notes (Signed)
Patient ID: John Weaver, male   DOB: [birth year], 81 y.o.   MRN: [id number]    Nursing Home Location:  [facility]   Place of Service: SNF (31)  PCP: Margaree Mackintosh, MD  Allergies  Allergen Reactions  . Sulfa Antibiotics Rash    Chief Complaint  Patient presents with  . Acute Visit    HPI:  Patient is a 81 y.o. male seen today at [facility] for follow up. Pt with a hx of fall on 06/24/14, he was seen in the ED for a fall.  He sustained right sided pubic rami fractures with fracture of the right greater trochanter, non-surgical management at this time. Pt seen 4 days ago due to  bruising was noted on his scrotum after peri care. Nursing has been following and this is unchanged. Pt with worsening LE edema. Pt denies LE pain or tenderness.    Review of Systems:  Review of Systems  Constitutional: Negative for fever, chills and fatigue.  HENT: Negative.   Respiratory: Negative for cough, shortness of breath and wheezing.   Cardiovascular: Positive for leg swelling. Negative for chest pain and palpitations.  Gastrointestinal: Negative for abdominal pain and abdominal distention.  Genitourinary: Negative for frequency, flank pain, penile swelling, scrotal swelling, penile pain and testicular pain.  Musculoskeletal: Positive for back pain (Patient reports chronic back pain ).  Skin: Positive for color change (on scrotal area. ).  Neurological: Negative for dizziness, light-headedness and headaches.    Past Medical History  Diagnosis Date  . Back pain   . Hypertension   . Hypercholesteremia   . Hypothyroidism   . Stroke 02/2012    tia  . History of CHF (congestive heart failure)   . Hemorrhoids   . DDD (degenerative disc disease)     of the spine  . Kidney stones   . Nephritis     at age 30  . Bradycardia     with snycope  . SSS (sick sinus syndrome)     in setting of syncope s/p PPM  . CHF (congestive heart failure)   . Dysrhythmia   .  GERD (gastroesophageal reflux disease)   . Cellulitis and abscess of leg 09/16/2013    RT LEG  . Macular degeneration     right eye   Past Surgical History  Procedure Laterality Date  . Knee surgery      left knee  . Joint replacement      Left knee replacement  . Insert / replace / remove pacemaker    . Permanent pacemaker insertion N/A 10/03/2011    Procedure: PERMANENT PACEMAKER INSERTION;  Surgeon: Duke Salvia, MD;  Location: [facility] CATH LAB;  Service: Cardiovascular;  Laterality: N/A;  . Temporary pacemaker insertion N/A 10/03/2011    Procedure: TEMPORARY PACEMAKER INSERTION;  Surgeon: Quintella Reichert, MD;  Location: [facility] CATH LAB;  Service: Cardiovascular;  Laterality: N/A;   Social History:   reports that he has quit smoking. His smoking use included Pipe. He has never used smokeless tobacco. He reports that he drinks about 0.6 oz of alcohol per week. He reports that he does not use illicit drugs.  Family History  Problem Relation Age of Onset  . Kidney disease Mother   . Kidney disease Father     Medications: Patient's Medications  New Prescriptions   No medications on file  Previous Medications   AMLODIPINE (NORVASC) 5 MG TABLET    Take 1 tablet (5 mg total)  by mouth daily.   AMOXICILLIN-CLAVULANATE (AUGMENTIN) 875-125 MG PER TABLET    Take 1 tablet by mouth 2 (two) times daily.   ASCORBIC ACID (VITAMIN C) 1000 MG TABLET    Take 1,000 mg by mouth daily.   CLOPIDOGREL (PLAVIX) 75 MG TABLET    TAKE ONE (1) TABLET BY MOUTH EVERY DAY   DIPHENHYDRAMINE-APAP, SLEEP, (TYLENOL PM EXTRA STRENGTH PO)    Take 2 tablets by mouth at bedtime as needed (for sleep).   HYDROCODONE-ACETAMINOPHEN (NORCO/VICODIN) 5-325 MG PER TABLET    Take 1 tablet by mouth every 6 (six) hours as needed for moderate pain or severe pain.   LEVOTHYROXINE (SYNTHROID, LEVOTHROID) 100 MCG TABLET    TAKE ONE (1) TABLET BY MOUTH EVERY DAY   MULTIPLE VITAMINS-MINERALS (MULTIVITAMINS THER. W/MINERALS) TABS    Take 1  tablet by mouth daily.   MUPIROCIN OINTMENT (BACTROBAN) 2 %    Apply 1 application topically 2 (two) times daily.   OMEPRAZOLE (PRILOSEC) 20 MG CAPSULE    Take 1 capsule (20 mg total) by mouth daily.   PAROXETINE (PAXIL) 10 MG TABLET    Take 1 tablet (10 mg total) by mouth daily.   PRAVASTATIN (PRAVACHOL) 40 MG TABLET    Take 40 mg by mouth daily.   SENNOSIDES (EX-LAX) 15 MG TABS    Take 15 mg by mouth at bedtime as needed. For constipation   TAMSULOSIN (FLOMAX) 0.4 MG CAPS CAPSULE    TAKE ONE CAPSULE BY MOUTH DAILY   TRIAMCINOLONE CREAM (KENALOG) 0.1 %    Apply 1 application topically daily as needed (itching).   Modified Medications   No medications on file  Discontinued Medications   No medications on file     Physical Exam: Filed Vitals:   07/01/14 1237  BP: 133/70  Pulse: 76  Temp: 97.2 F (36.2 C)  Resp: 20    Physical Exam  Constitutional: He appears well-developed. No distress.  HENT:  Head: Normocephalic and atraumatic.  Eyes: Conjunctivae are normal. Pupils are equal, round, and reactive to light.  Cardiovascular: Normal rate and regular rhythm.   Murmur heard. Pacemaker present  Pulmonary/Chest: Effort normal and breath sounds normal.  Abdominal: Soft. He exhibits no distension. There is no tenderness.  Genitourinary:  Ecchymosis noted beginning at the base of the penis extends into scrotum on right side, minimal to the perineal area, No pain upon palpation, no swelling, no redness.   Musculoskeletal: He exhibits edema (2+ edema bilaterally). He exhibits no tenderness.  Skin: Skin is warm and dry. No rash noted. He is not diaphoretic. No erythema.  Psychiatric: He has a normal mood and affect.    Labs reviewed: Basic Metabolic Panel:  Recent Labs  09/81/1902/01/17 0902 06/24/14 0327 06/24/14 0746  NA 140 138 137  K 4.4 4.6 4.5  CL 114* 108 108  CO2 22 21 23   GLUCOSE 148* 110* 128*  BUN 43* 43* 41*  CREATININE 1.70* 1.82* 1.90*  CALCIUM 9.4 9.1 9.1   Liver  Function Tests:  Recent Labs  07/30/13 0907 09/16/13 1336 06/24/14 0746  AST 37 39* 30  ALT 20 20 21   ALKPHOS 75 104 115  BILITOT 0.8 0.4 1.0  PROT 6.1 7.0 6.3  ALBUMIN 3.9 3.8 3.5   No results for input(s): LIPASE, AMYLASE in the last 8760 hours. No results for input(s): AMMONIA in the last 8760 hours. CBC:  Recent Labs  06/06/14 1313 06/24/14 0327 06/24/14 0746  WBC 7.4 10.5 13.5*  NEUTROABS 5.1  8.5* 10.3*  HGB 9.4* 8.4* 8.7*  HCT 27.9* 24.8* 26.2*  MCV 103.0* 98.4 99.2  PLT 205 239 263   TSH:  Recent Labs  07/30/13 0907  TSH 2.766   A1C: Lab Results  Component Value Date   HGBA1C 4.9 02/17/2012   Lipid Panel:  Recent Labs  07/30/13 0907  CHOL 145  HDL 55  LDLCALC 76  TRIG 71  CHOLHDL 2.6    Radiological Exams: Dg Hip Unilat With Pelvis 1v Right  06/24/2014   CLINICAL DATA:  Larey Seat tonight.  EXAM: RIGHT HIP (WITH PELVIS) 1 VIEW  COMPARISON:  None.  FINDINGS: There are fractures of the superior and inferior right pubic rami. There is a fracture of the right greater trochanter. There is no evidence of a fracture line extending into the femoral neck. Acetabulum appears intact.  IMPRESSION: Right pubic ramus fractures.  Right greater trochanter fracture.   Electronically Signed   By: Ellery Plunk M.D.   On: 06/24/2014 03:08   Assessment/Plan  1. Ecchymosis  Unchanged which is expected, should take some time for bruising to resolve. No signs of acute bleed. No swelling,  tenderness OR redness.    2. Anemia Pending labs to follow from hospitalization   3. Dependent edema No pain, tenderness or redness noted. Staff to use TED hose in am and off in pm, elevation as much as possible   4. Leukocytosis CBC ordered to follow up.

## 2014-07-14 ENCOUNTER — Other Ambulatory Visit: Payer: Self-pay | Admitting: *Deleted

## 2014-07-14 MED ORDER — HYDROCODONE-ACETAMINOPHEN 5-325 MG PO TABS: ORAL_TABLET | ORAL | Status: DC

## 2014-07-14 NOTE — Telephone Encounter (Signed)
[facility] 

## 2014-07-17 ENCOUNTER — Encounter: Payer: Self-pay | Admitting: Internal Medicine

## 2014-07-17 ENCOUNTER — Non-Acute Institutional Stay (SKILLED_NURSING_FACILITY): Payer: Medicare Other | Admitting: Internal Medicine

## 2014-07-17 DIAGNOSIS — E034 Atrophy of thyroid (acquired): Secondary | ICD-10-CM | POA: Diagnosis not present

## 2014-07-17 DIAGNOSIS — I350 Nonrheumatic aortic (valve) stenosis: Secondary | ICD-10-CM

## 2014-07-17 DIAGNOSIS — N183 Chronic kidney disease, stage 3 unspecified: Secondary | ICD-10-CM

## 2014-07-17 DIAGNOSIS — Z950 Presence of cardiac pacemaker: Secondary | ICD-10-CM | POA: Diagnosis not present

## 2014-07-17 DIAGNOSIS — I10 Essential (primary) hypertension: Secondary | ICD-10-CM

## 2014-07-17 DIAGNOSIS — E785 Hyperlipidemia, unspecified: Secondary | ICD-10-CM | POA: Diagnosis not present

## 2014-07-17 DIAGNOSIS — S32810D Multiple fractures of pelvis with stable disruption of pelvic ring, subsequent encounter for fracture with routine healing: Secondary | ICD-10-CM | POA: Diagnosis not present

## 2014-07-17 DIAGNOSIS — E038 Other specified hypothyroidism: Secondary | ICD-10-CM

## 2014-07-17 DIAGNOSIS — D649 Anemia, unspecified: Secondary | ICD-10-CM

## 2014-07-17 NOTE — Progress Notes (Signed)
MRN: [id number] Name: John Weaver  Sex: male Age: 81 y.o. DOB: May 26, 1915  [id number] Facility/Room:111 Level Of Care: SNF Provider: Merrilee SeashoreALEXANDER, Eveny Anastas D Emergency Contacts: Extended Emergency Contact Information Primary Emergency Contact: Aichele,Porter Address: [address]          [place] [id number] [place] Home Phone: [phone] Mobile Phone: [phone] Relation: Spouse  Code Status: DNR  Allergies: Sulfa antibiotics  Chief Complaint  Patient presents with  . Discharge Note    HPI: Patient is 81 y.o. male who sustained R pubic rami fx and hip fx, treated in SNF with OT/PT who is now ready to go home.  Past Medical History  Diagnosis Date  . Back pain   . Hypertension   . Hypercholesteremia   . Hypothyroidism   . Stroke 02/2012    tia  . History of CHF (congestive heart failure)   . Hemorrhoids   . DDD (degenerative disc disease)     of the spine  . Kidney stones   . Nephritis     at age 81  . Bradycardia     with snycope  . SSS (sick sinus syndrome)     in setting of syncope s/p PPM  . CHF (congestive heart failure)   . Dysrhythmia   . GERD (gastroesophageal reflux disease)   . Cellulitis and abscess of leg 09/16/2013    RT LEG  . Macular degeneration     right eye    Past Surgical History  Procedure Laterality Date  . Knee surgery      left knee  . Joint replacement      Left knee replacement  . Insert / replace / remove pacemaker    . Permanent pacemaker insertion N/A 10/03/2011    Procedure: PERMANENT PACEMAKER INSERTION;  Surgeon: Duke SalviaSteven C Klein, MD;  [facility];  Service: Cardiovascular;  Laterality: N/A;  . Temporary pacemaker insertion N/A 10/03/2011    Procedure: TEMPORARY PACEMAKER INSERTION;  Surgeon: Quintella Reichertraci R Turner, MD;  Location: [facility] CATH LAB;  Service: Cardiovascular;  Laterality: N/A;      Medication List       This list is accurate as of: 07/17/14  6:17 PM.  Always use your most recent med  list.               amLODipine 5 MG tablet  Commonly known as:  NORVASC  Take 1 tablet (5 mg total) by mouth daily.     clopidogrel 75 MG tablet  Commonly known as:  PLAVIX  TAKE ONE (1) TABLET BY MOUTH EVERY DAY     EX-LAX 15 MG Tabs  Generic drug:  Sennosides  Take 15 mg by mouth at bedtime as needed. For constipation     HYDROcodone-acetaminophen 5-325 MG per tablet  Commonly known as:  NORCO/VICODIN  Take one tablet by mouth every 6 hours as needed for moderate to severe pain     levothyroxine 100 MCG tablet  Commonly known as:  SYNTHROID, LEVOTHROID  TAKE ONE (1) TABLET BY MOUTH EVERY DAY     multivitamins ther. w/minerals Tabs tablet  Take 1 tablet by mouth daily.     omeprazole 20 MG capsule  Commonly known as:  PRILOSEC  Take 1 capsule (20 mg total) by mouth daily.     PARoxetine 10 MG tablet  Commonly known as:  PAXIL  Take 1 tablet (10 mg total) by mouth daily.     pravastatin 40 MG tablet  Commonly known  as:  PRAVACHOL  Take 40 mg by mouth daily.     tamsulosin 0.4 MG Caps capsule  Commonly known as:  FLOMAX  TAKE ONE CAPSULE BY MOUTH DAILY     triamcinolone cream 0.1 %  Commonly known as:  KENALOG  Apply 1 application topically daily as needed (itching).     TYLENOL PM EXTRA STRENGTH PO  Take 2 tablets by mouth at bedtime as needed (for sleep).     vitamin C 1000 MG tablet  Take 1,000 mg by mouth daily.        No orders of the defined types were placed in this encounter.    Immunization History  Administered Date(s) Administered  . Influenza,inj,Quad PF,36+ Mos 01/16/2013, 02/04/2014  . Tdap 02/29/2012    History  Substance Use Topics  . Smoking status: Former Smoker    Types: Pipe  . Smokeless tobacco: Never Used  . Alcohol Use: 0.6 oz/week    1 Glasses of wine per week    Filed Vitals:   07/17/14 1807  BP: 145/45  Pulse: 79  Temp: 96.9 F (36.1 C)  Resp: 15    Physical Exam  GENERAL APPEARANCE: Alert, conversant. No  acute distress.  HEENT: Unremarkable. RESPIRATORY: Breathing is even, unlabored. Lung sounds are clear   CARDIOVASCULAR: Heart RRR no murmurs, rubs or gallops. No peripheral edema.  GASTROINTESTINAL: Abdomen is soft, non-tender, not distended w/ normal bowel sounds.  NEUROLOGIC: Cranial nerves 2-12 grossly intact. Moves all extremities  Patient Active Problem List   Diagnosis Date Noted  . Leukocytosis 06/26/2014  . Multiple pelvic fractures 06/24/2014  . Chronic anemia 06/24/2014  . Pelvic fracture 06/24/2014  . Atrial fibrillation 06/17/2014  . Urinary incontinence 03/02/2014  . Cellulitis 09/16/2013  . RBBB 04/24/2013  . HTN (hypertension) 04/24/2013  . Carotid bruit 04/24/2013  . SSS (sick sinus syndrome)   . Syncope 04/02/2013  . Hyperlipidemia 04/05/2012  . History of TIA (transient ischemic attack) 04/05/2012  . CKD (chronic kidney disease) stage 3, GFR 30-59 ml/min 02/04/2012  . Musculoskeletal pain 02/04/2012  . Dependent edema 02/04/2012  . Macular degeneration 02/02/2012  . Aortic stenosis 10/03/2011  . Pacemaker-St.Jude 10/03/2011  . Hypothyroidism 10/01/2011    CBC    Component Value Date/Time   WBC 13.5* 06/24/2014 0746   RBC 2.64* 06/24/2014 0746   RBC 3.23* 10/02/2011 0816   HGB 8.7* 06/24/2014 0746   HCT 26.2* 06/24/2014 0746   PLT 263 06/24/2014 0746   MCV 99.2 06/24/2014 0746   LYMPHSABS 1.7 06/24/2014 0746   MONOABS 1.3* 06/24/2014 0746   EOSABS 0.1 06/24/2014 0746   BASOSABS 0.0 06/24/2014 0746    CMP     Component Value Date/Time   NA 137 06/24/2014 0746   K 4.5 06/24/2014 0746   CL 108 06/24/2014 0746   CO2 23 06/24/2014 0746   GLUCOSE 128* 06/24/2014 0746   BUN 41* 06/24/2014 0746   CREATININE 1.90* 06/24/2014 0746   CREATININE 1.55* 10/24/2013 1020   CALCIUM 9.1 06/24/2014 0746   PROT 6.3 06/24/2014 0746   ALBUMIN 3.5 06/24/2014 0746   AST 30 06/24/2014 0746   ALT 21 06/24/2014 0746   ALKPHOS 115 06/24/2014 0746   BILITOT 1.0  06/24/2014 0746   GFRNONAA 28* 06/24/2014 0746   GFRAA 33* 06/24/2014 0746    Assessment and Plan  Pt is stable to d/c to home with HH/OT/PT. Pt will need a WC, BSC, tub/shower bench and rolling walker.  Margit Hanks, MD

## 2014-07-17 NOTE — Progress Notes (Signed)
Late entry for missed G-code. Based on review of the evaluation and goals by Kingsley CallanderKaren Smith, PT.   06/24/14 1013  PT G-Codes **NOT FOR INPATIENT CLASS**  Functional Assessment Tool Used Clinical judgement based on review of medical record  Functional Limitation Mobility: Walking and moving around  Mobility: Walking and Moving Around Current Status (M5784(G8978) CL  Mobility: Walking and Moving Around Goal Status [phone](G8979) CI  Lavona MoundMark Lisl [place], [place]  [phone] 07/17/2014

## 2014-07-22 ENCOUNTER — Encounter (HOSPITAL_COMMUNITY): Payer: Self-pay | Admitting: Family Medicine

## 2014-07-22 ENCOUNTER — Telehealth: Payer: Self-pay | Admitting: *Deleted

## 2014-07-22 ENCOUNTER — Inpatient Hospital Stay (HOSPITAL_COMMUNITY): Payer: Medicare Other

## 2014-07-22 ENCOUNTER — Inpatient Hospital Stay (HOSPITAL_COMMUNITY)
Admission: EM | Admit: 2014-07-22 | Discharge: 2014-07-26 | DRG: 202 | Disposition: A | Discharge status: Home / Self Care | Payer: Medicare Other | Attending: Internal Medicine | Admitting: Internal Medicine

## 2014-07-22 ENCOUNTER — Emergency Department (HOSPITAL_COMMUNITY): Payer: Medicare Other

## 2014-07-22 DIAGNOSIS — I5033 Acute on chronic diastolic (congestive) heart failure: Secondary | ICD-10-CM | POA: Diagnosis not present

## 2014-07-22 DIAGNOSIS — F0150 Vascular dementia without behavioral disturbance: Secondary | ICD-10-CM | POA: Insufficient documentation

## 2014-07-22 DIAGNOSIS — K922 Gastrointestinal hemorrhage, unspecified: Secondary | ICD-10-CM | POA: Diagnosis not present

## 2014-07-22 DIAGNOSIS — K219 Gastro-esophageal reflux disease without esophagitis: Secondary | ICD-10-CM | POA: Diagnosis not present

## 2014-07-22 DIAGNOSIS — Z96652 Presence of left artificial knee joint: Secondary | ICD-10-CM | POA: Diagnosis present

## 2014-07-22 DIAGNOSIS — Z515 Encounter for palliative care: Secondary | ICD-10-CM

## 2014-07-22 DIAGNOSIS — F419 Anxiety disorder, unspecified: Secondary | ICD-10-CM | POA: Diagnosis present

## 2014-07-22 DIAGNOSIS — Z66 Do not resuscitate: Secondary | ICD-10-CM | POA: Diagnosis present

## 2014-07-22 DIAGNOSIS — N183 Chronic kidney disease, stage 3 (moderate): Secondary | ICD-10-CM | POA: Diagnosis present

## 2014-07-22 DIAGNOSIS — R112 Nausea with vomiting, unspecified: Secondary | ICD-10-CM

## 2014-07-22 DIAGNOSIS — Z8673 Personal history of transient ischemic attack (TIA), and cerebral infarction without residual deficits: Secondary | ICD-10-CM

## 2014-07-22 DIAGNOSIS — E785 Hyperlipidemia, unspecified: Secondary | ICD-10-CM | POA: Diagnosis present

## 2014-07-22 DIAGNOSIS — D638 Anemia in other chronic diseases classified elsewhere: Secondary | ICD-10-CM | POA: Diagnosis not present

## 2014-07-22 DIAGNOSIS — J208 Acute bronchitis due to other specified organisms: Secondary | ICD-10-CM | POA: Diagnosis not present

## 2014-07-22 DIAGNOSIS — F329 Major depressive disorder, single episode, unspecified: Secondary | ICD-10-CM | POA: Diagnosis present

## 2014-07-22 DIAGNOSIS — Z87891 Personal history of nicotine dependence: Secondary | ICD-10-CM | POA: Diagnosis not present

## 2014-07-22 DIAGNOSIS — I129 Hypertensive chronic kidney disease with stage 1 through stage 4 chronic kidney disease, or unspecified chronic kidney disease: Secondary | ICD-10-CM | POA: Diagnosis present

## 2014-07-22 DIAGNOSIS — N400 Enlarged prostate without lower urinary tract symptoms: Secondary | ICD-10-CM | POA: Diagnosis present

## 2014-07-22 DIAGNOSIS — Z950 Presence of cardiac pacemaker: Secondary | ICD-10-CM

## 2014-07-22 DIAGNOSIS — Z7902 Long term (current) use of antithrombotics/antiplatelets: Secondary | ICD-10-CM

## 2014-07-22 DIAGNOSIS — J209 Acute bronchitis, unspecified: Secondary | ICD-10-CM | POA: Diagnosis not present

## 2014-07-22 DIAGNOSIS — R0602 Shortness of breath: Secondary | ICD-10-CM

## 2014-07-22 DIAGNOSIS — E039 Hypothyroidism, unspecified: Secondary | ICD-10-CM | POA: Diagnosis present

## 2014-07-22 DIAGNOSIS — I350 Nonrheumatic aortic (valve) stenosis: Secondary | ICD-10-CM | POA: Diagnosis present

## 2014-07-22 DIAGNOSIS — Z882 Allergy status to sulfonamides status: Secondary | ICD-10-CM | POA: Diagnosis not present

## 2014-07-22 DIAGNOSIS — E780 Pure hypercholesterolemia: Secondary | ICD-10-CM | POA: Diagnosis present

## 2014-07-22 DIAGNOSIS — J40 Bronchitis, not specified as acute or chronic: Secondary | ICD-10-CM

## 2014-07-22 DIAGNOSIS — R195 Other fecal abnormalities: Secondary | ICD-10-CM | POA: Diagnosis present

## 2014-07-22 DIAGNOSIS — R0600 Dyspnea, unspecified: Secondary | ICD-10-CM | POA: Diagnosis not present

## 2014-07-22 HISTORY — DX: Anemia, unspecified: D64.9

## 2014-07-22 HISTORY — DX: Pneumonia, unspecified organism: J18.9

## 2014-07-22 HISTORY — DX: Cardiac murmur, unspecified: R01.1

## 2014-07-22 HISTORY — DX: Unspecified osteoarthritis, unspecified site: M19.90

## 2014-07-22 HISTORY — DX: Personal history of other medical treatment: Z92.89

## 2014-07-22 HISTORY — DX: Presence of cardiac pacemaker: Z95.0

## 2014-07-22 HISTORY — DX: Personal history of other diseases of the musculoskeletal system and connective tissue: Z87.39

## 2014-07-22 LAB — CBC
HCT: 22.5 % — ABNORMAL LOW (ref 39.0–52.0)
HEMOGLOBIN: 7.4 g/dL — AB (ref 13.0–17.0)
MCH: 32.6 pg (ref 26.0–34.0)
MCHC: 32.9 g/dL (ref 30.0–36.0)
MCV: 99.1 fL (ref 78.0–100.0)
PLATELETS: 268 10*3/uL (ref 150–400)
RBC: 2.27 MIL/uL — ABNORMAL LOW (ref 4.22–5.81)
RDW: 13.5 % (ref 11.5–15.5)
WBC: 10.4 10*3/uL (ref 4.0–10.5)

## 2014-07-22 LAB — PREPARE RBC (CROSSMATCH)

## 2014-07-22 LAB — I-STAT CG4 LACTIC ACID, ED
Lactic Acid, Venous: 1.75 mmol/L (ref 0.5–2.0)
Lactic Acid, Venous: 1.01 mmol/L (ref 0.5–2.0)

## 2014-07-22 LAB — CBC WITH DIFFERENTIAL/PLATELET
Basophils Absolute: 0 10*3/uL (ref 0.0–0.1)
Basophils Relative: 0 % (ref 0–1)
Eosinophils Absolute: 0.1 10*3/uL (ref 0.0–0.7)
Eosinophils Relative: 1 % (ref 0–5)
HEMATOCRIT: 21.4 % — AB (ref 39.0–52.0)
HEMOGLOBIN: 7 g/dL — AB (ref 13.0–17.0)
Lymphocytes Relative: 10 % — ABNORMAL LOW (ref 12–46)
Lymphs Abs: 1.1 10*3/uL (ref 0.7–4.0)
MCH: 32.4 pg (ref 26.0–34.0)
MCHC: 32.7 g/dL (ref 30.0–36.0)
MCV: 99.1 fL (ref 78.0–100.0)
MONO ABS: 0.7 10*3/uL (ref 0.1–1.0)
MONOS PCT: 6 % (ref 3–12)
Neutro Abs: 9.1 10*3/uL — ABNORMAL HIGH (ref 1.7–7.7)
Neutrophils Relative %: 83 % — ABNORMAL HIGH (ref 43–77)
Platelets: 277 10*3/uL (ref 150–400)
RBC: 2.16 MIL/uL — ABNORMAL LOW (ref 4.22–5.81)
RDW: 13.4 % (ref 11.5–15.5)
WBC: 11 10*3/uL — ABNORMAL HIGH (ref 4.0–10.5)

## 2014-07-22 LAB — COMPREHENSIVE METABOLIC PANEL
ALK PHOS: 238 U/L — AB (ref 39–117)
ALT: 29 U/L (ref 0–53)
ANION GAP: 7 (ref 5–15)
AST: 43 U/L — ABNORMAL HIGH (ref 0–37)
Albumin: 2.8 g/dL — ABNORMAL LOW (ref 3.5–5.2)
BUN: 27 mg/dL — ABNORMAL HIGH (ref 6–23)
CO2: 23 mmol/L (ref 19–32)
CREATININE: 1.52 mg/dL — AB (ref 0.50–1.35)
Calcium: 8.8 mg/dL (ref 8.4–10.5)
Chloride: 108 mmol/L (ref 96–112)
GFR, EST AFRICAN AMERICAN: 43 mL/min — AB (ref >90)
GFR, EST NON AFRICAN AMERICAN: 37 mL/min — AB (ref >90)
Glucose, Bld: 109 mg/dL — ABNORMAL HIGH (ref 70–99)
Potassium: 4.6 mmol/L (ref 3.5–5.1)
SODIUM: 138 mmol/L (ref 135–145)
Total Bilirubin: 0.4 mg/dL (ref 0.3–1.2)
Total Protein: 5.7 g/dL — ABNORMAL LOW (ref 6.0–8.3)

## 2014-07-22 LAB — I-STAT TROPONIN, ED: TROPONIN I, POC: 0.01 ng/mL (ref 0.00–0.08)

## 2014-07-22 LAB — BRAIN NATRIURETIC PEPTIDE: B Natriuretic Peptide: 574 pg/mL — ABNORMAL HIGH (ref 0.0–100.0)

## 2014-07-22 LAB — CREATININE, SERUM
Creatinine, Ser: 1.49 mg/dL — ABNORMAL HIGH (ref 0.50–1.35)
GFR calc Af Amer: 44 mL/min — ABNORMAL LOW (ref >90)
GFR calc non Af Amer: 38 mL/min — ABNORMAL LOW (ref >90)

## 2014-07-22 LAB — MAGNESIUM: Magnesium: 2.2 mg/dL (ref 1.5–2.5)

## 2014-07-22 LAB — TROPONIN I: Troponin I: 0.03 ng/mL (ref <0.031)

## 2014-07-22 LAB — ABO/RH: ABO/RH(D): O POS

## 2014-07-22 LAB — POC OCCULT BLOOD, ED: FECAL OCCULT BLD: POSITIVE — AB

## 2014-07-22 LAB — TSH: TSH: 3.898 u[IU]/mL (ref 0.350–4.500)

## 2014-07-22 MED ORDER — TAMSULOSIN HCL 0.4 MG PO CAPS
0.4000 mg | ORAL_CAPSULE | Freq: Every day | ORAL | Status: DC
  Administered 2014-07-22 – 2014-07-26 (×5): 0.4 mg via ORAL
  Filled 2014-07-22 (×5): qty 1

## 2014-07-22 MED ORDER — ACETAMINOPHEN 650 MG RE SUPP: 650.0000 mg | Freq: Four times a day (QID) | RECTAL | Status: DC | PRN

## 2014-07-22 MED ORDER — ACETAMINOPHEN 325 MG PO TABS: 650.0000 mg | ORAL_TABLET | Freq: Four times a day (QID) | ORAL | Status: DC | PRN

## 2014-07-22 MED ORDER — PRAVASTATIN SODIUM 40 MG PO TABS
40.0000 mg | ORAL_TABLET | Freq: Every day | ORAL | Status: DC
  Administered 2014-07-22 – 2014-07-24 (×3): 40 mg via ORAL
  Filled 2014-07-22 (×3): qty 1

## 2014-07-22 MED ORDER — SODIUM CHLORIDE 0.9 % IV SOLN
INTRAVENOUS | Status: DC
  Administered 2014-07-22 – 2014-07-23 (×2): via INTRAVENOUS

## 2014-07-22 MED ORDER — SODIUM CHLORIDE 0.9 % IV SOLN: 10.0000 mL/h | Freq: Once | INTRAVENOUS | Status: DC

## 2014-07-22 MED ORDER — ONDANSETRON HCL 4 MG PO TABS: 4.0000 mg | ORAL_TABLET | Freq: Four times a day (QID) | ORAL | Status: DC | PRN

## 2014-07-22 MED ORDER — ONDANSETRON HCL 4 MG/2ML IJ SOLN: 4.0000 mg | Freq: Four times a day (QID) | INTRAMUSCULAR | Status: DC | PRN

## 2014-07-22 MED ORDER — IPRATROPIUM-ALBUTEROL 0.5-2.5 (3) MG/3ML IN SOLN
3.0000 mL | Freq: Once | RESPIRATORY_TRACT | Status: AC
  Administered 2014-07-22: 3 mL via RESPIRATORY_TRACT
  Filled 2014-07-22: qty 3

## 2014-07-22 MED ORDER — PANTOPRAZOLE SODIUM 40 MG PO TBEC
40.0000 mg | DELAYED_RELEASE_TABLET | Freq: Two times a day (BID) | ORAL | Status: DC
  Administered 2014-07-22 – 2014-07-25 (×8): 40 mg via ORAL
  Filled 2014-07-22 (×7): qty 1

## 2014-07-22 MED ORDER — LEVALBUTEROL HCL 0.63 MG/3ML IN NEBU
0.6300 mg | INHALATION_SOLUTION | Freq: Four times a day (QID) | RESPIRATORY_TRACT | Status: DC | PRN
  Administered 2014-07-23 (×2): 0.63 mg via RESPIRATORY_TRACT
  Filled 2014-07-22 (×2): qty 3

## 2014-07-22 MED ORDER — DEXTROSE 5 % IV SOLN
500.0000 mg | INTRAVENOUS | Status: DC
  Administered 2014-07-22 – 2014-07-23 (×2): 500 mg via INTRAVENOUS
  Filled 2014-07-22 (×2): qty 500

## 2014-07-22 MED ORDER — AMLODIPINE BESYLATE 5 MG PO TABS
5.0000 mg | ORAL_TABLET | Freq: Every day | ORAL | Status: DC
  Administered 2014-07-22 – 2014-07-24 (×3): 5 mg via ORAL
  Filled 2014-07-22 (×3): qty 1

## 2014-07-22 MED ORDER — PAROXETINE HCL 10 MG PO TABS
10.0000 mg | ORAL_TABLET | Freq: Every day | ORAL | Status: DC
  Administered 2014-07-22 – 2014-07-26 (×5): 10 mg via ORAL
  Filled 2014-07-22 (×5): qty 1

## 2014-07-22 MED ORDER — HEPARIN SODIUM (PORCINE) 5000 UNIT/ML IJ SOLN
5000.0000 [IU] | Freq: Three times a day (TID) | INTRAMUSCULAR | Status: DC
  Administered 2014-07-22 – 2014-07-24 (×8): 5000 [IU] via SUBCUTANEOUS
  Filled 2014-07-22 (×6): qty 1

## 2014-07-22 MED ORDER — SODIUM CHLORIDE 0.9 % IJ SOLN
3.0000 mL | Freq: Two times a day (BID) | INTRAMUSCULAR | Status: DC
  Administered 2014-07-22 – 2014-07-26 (×7): 3 mL via INTRAVENOUS

## 2014-07-22 MED ORDER — FERROUS SULFATE 325 (65 FE) MG PO TABS
325.0000 mg | ORAL_TABLET | Freq: Two times a day (BID) | ORAL | Status: DC
  Administered 2014-07-22 – 2014-07-24 (×5): 325 mg via ORAL
  Filled 2014-07-22 (×7): qty 1

## 2014-07-22 MED ORDER — SODIUM CHLORIDE 0.9 % IV SOLN
80.0000 mg | Freq: Once | INTRAVENOUS | Status: AC
  Administered 2014-07-22: 80 mg via INTRAVENOUS
  Filled 2014-07-22 (×2): qty 80

## 2014-07-22 NOTE — Consult Note (Signed)
[facility] Consultation Note  Referring Provider: Dr. Richarda Overlie Primary Care Physician:  Margaree Mackintosh, MD Primary Gastroenterologist:  Dr. Bernette Redbird  Reason for Consultation:  Anemia, hemoccult-positive stool  HPI: John Weaver is a 81 y.o. male presenting with complaints of progressive cough and shortness-of-breath.  Patient has multiple cardiopulmonary comorbidities, as listed below.  He is on clopidigrel chronically.  We were asked to see patient for anemia and hemoccult-positive stool.  ED reports patient had black emesis; patient denies this.  Patient denies overt blood in stool.  No abdominal pain, change in bowel habits, unintentional weight loss.  Does not recall having endoscopy or colonoscopy any time within the past several years.  Past Medical History  Diagnosis Date  . Back pain   . Hypertension   . Hypercholesteremia   . Hypothyroidism   . Stroke 02/2012    tia  . History of CHF (congestive heart failure)   . Hemorrhoids   . DDD (degenerative disc disease)     of the spine  . Kidney stones   . Nephritis     at age 27  . Bradycardia     with snycope  . SSS (sick sinus syndrome)     in setting of syncope s/p PPM  . CHF (congestive heart failure)   . Dysrhythmia   . GERD (gastroesophageal reflux disease)   . Cellulitis and abscess of leg 09/16/2013    RT LEG  . Macular degeneration     right eye    Past Surgical History  Procedure Laterality Date  . Knee surgery      left knee  . Joint replacement      Left knee replacement  . Insert / replace / remove pacemaker    . Permanent pacemaker insertion N/A 10/03/2011    Procedure: PERMANENT PACEMAKER INSERTION;  Surgeon: Duke Salvia, MD;  Location: [facility] CATH LAB;  Service: Cardiovascular;  Laterality: N/A;  . Temporary pacemaker insertion N/A 10/03/2011    Procedure: TEMPORARY PACEMAKER INSERTION;  Surgeon: Quintella Reichert, MD;  Location: [facility] CATH LAB;  Service: Cardiovascular;  Laterality: N/A;     Prior to Admission medications   Medication Sig Start Date End Date Taking? Authorizing Provider  amLODipine (NORVASC) 5 MG tablet Take 1 tablet (5 mg total) by mouth daily. 06/04/14  Yes Quintella Reichert, MD  Ascorbic Acid (VITAMIN C) 1000 MG tablet Take 1,000 mg by mouth daily.   Yes Historical Provider, MD  clopidogrel (PLAVIX) 75 MG tablet TAKE ONE (1) TABLET BY MOUTH EVERY DAY 04/15/14  Yes Margaree Mackintosh, MD  Diphenhydramine-APAP, sleep, (TYLENOL PM EXTRA STRENGTH PO) Take 1 tablet by mouth at bedtime as needed (for sleep).    Yes Historical Provider, MD  HYDROcodone-acetaminophen (NORCO/VICODIN) 5-325 MG per tablet Take one tablet by mouth every 6 hours as needed for moderate to severe pain 07/14/14  Yes Tiffany L Reed, DO  levothyroxine (SYNTHROID, LEVOTHROID) 100 MCG tablet TAKE ONE (1) TABLET BY MOUTH EVERY DAY 04/21/14  Yes Margaree Mackintosh, MD  Multiple Vitamins-Minerals (MULTIVITAMINS THER. W/MINERALS) TABS Take 1 tablet by mouth daily.   Yes Historical Provider, MD  omeprazole (PRILOSEC) 20 MG capsule Take 1 capsule (20 mg total) by mouth daily. 04/22/14  Yes Margaree Mackintosh, MD  PARoxetine (PAXIL) 10 MG tablet Take 1 tablet (10 mg total) by mouth daily. 01/06/14  Yes Margaree Mackintosh, MD  pravastatin (PRAVACHOL) 40 MG tablet Take 40 mg by mouth daily.   Yes Historical  Provider, MD  tamsulosin (FLOMAX) 0.4 MG CAPS capsule TAKE ONE CAPSULE BY MOUTH DAILY 05/27/14  Yes Margaree Mackintosh, MD  triamcinolone cream (KENALOG) 0.1 % Apply 1 application topically daily as needed (itching).  11/13/13  Yes Historical Provider, MD    Current Facility-Administered Medications  Medication Dose Route Frequency Provider Last Rate Last Dose  . 0.9 %  sodium chloride infusion  10 mL/hr Intravenous Once Richardean Canal, MD      . 0.9 %  sodium chloride infusion   Intravenous Continuous Richarda Overlie, MD 75 mL/hr at 07/22/14 1255    . acetaminophen (TYLENOL) tablet 650 mg  650 mg Oral Q6H PRN Richarda Overlie, MD       Or  .  acetaminophen (TYLENOL) suppository 650 mg  650 mg Rectal Q6H PRN Richarda Overlie, MD      . amLODipine (NORVASC) tablet 5 mg  5 mg Oral Daily Richarda Overlie, MD      . azithromycin (ZITHROMAX) 500 mg in dextrose 5 % 250 mL IVPB  500 mg Intravenous Q24H Richarda Overlie, MD      . heparin injection 5,000 Units  5,000 Units Subcutaneous 3 times per day Richarda Overlie, MD      . levalbuterol (XOPENEX) nebulizer solution 0.63 mg  0.63 mg Nebulization Q6H PRN Richarda Overlie, MD      . ondansetron (ZOFRAN) tablet 4 mg  4 mg Oral Q6H PRN Richarda Overlie, MD       Or  . ondansetron (ZOFRAN) injection 4 mg  4 mg Intravenous Q6H PRN Richarda Overlie, MD      . pantoprazole (PROTONIX) EC tablet 40 mg  40 mg Oral BID Richarda Overlie, MD      . PARoxetine (PAXIL) tablet 10 mg  10 mg Oral Daily Richarda Overlie, MD      . pravastatin (PRAVACHOL) tablet 40 mg  40 mg Oral Daily Nayana Abrol, MD      . sodium chloride 0.9 % injection 3 mL  3 mL Intravenous Q12H Richarda Overlie, MD   3 mL at 07/22/14 1200  . tamsulosin (FLOMAX) capsule 0.4 mg  0.4 mg Oral Daily Richarda Overlie, MD        Allergies as of 07/22/2014 - Review Complete 07/22/2014  Allergen Reaction Noted  . Sulfa antibiotics Rash 10/01/2011    Family History  Problem Relation Age of Onset  . Kidney disease Mother   . Kidney disease Father     History   Social History  . Marital Status: Married    Spouse Name: N/A  . Number of Children: N/A  . Years of Education: N/A   Occupational History  . Not on file.   Social History Main Topics  . Smoking status: Former Smoker    Types: Pipe  . Smokeless tobacco: Never Used  . Alcohol Use: 0.6 oz/week    1 Glasses of wine per week  . Drug Use: No  . Sexual Activity: No   Other Topics Concern  . Not on file   Social History Narrative    Review of Systems: ROS Dr. Susie Cassette 07/22/14 reviewed and I agree  Physical Exam: Vital signs in last 24 hours: Temp:  [98 F (36.7 C)-98.3 F (36.8 C)] 98.3 F (36.8 C) (04/19  1335) Pulse Rate:  [72-89] 84 (04/19 1335) Resp:  [15-25] 18 (04/19 1335) BP: (112-152)/(44-68) 123/53 mmHg (04/19 1335) SpO2:  [96 %-100 %] 99 % (04/19 1335) Weight:  [61.7 kg (136 lb 0.4 oz)] 61.7 kg (  136 lb 0.4 oz) (04/19 1252)   General:   Alert, can answer questions appropriately, elderly but pleasant and cooperative in NAD Head:  Normocephalic and atraumatic. Eyes:  Sclera clear, no icterus.   Conjunctiva somewhat pale Ears:  Normal auditory acuity. Nose:  No deformity, discharge,  or lesions. Mouth:  No deformity or lesions.  Oropharynx somewhat pale- and dry-appearing Neck:  Supple; no masses or thyromegaly. Lungs:  Generalized post-expiratory wheezes; no crackles, or rhonchi. No acute distress. Heart:  Regular rate and rhythm; no murmurs, clicks, rubs,  or gallops. Abdomen:  Soft, nontender and nondistended. No masses, hepatosplenomegaly or hernias noted. Normal bowel sounds, without guarding, and without rebound.     Msk:  Symmetrical without gross deformities. Normal posture. Pulses:  Normal pulses noted. Extremities:  Without clubbing or edema. Neurologic:  Alert and  oriented x4;  Diffusely weak, otherwise grossly normal neurologically. Skin:  Scattered ecchymoses, otherwise intact without significant lesions or rashes. Cervical Nodes:  No significant cervical adenopathy. Psych:  Alert and cooperative. Normal mood and affect.   Lab Results:  Recent Labs  07/22/14 0942 07/22/14 1203  WBC 11.0* 10.4  HGB 7.0* 7.4*  HCT 21.4* 22.5*  PLT 277 268   BMET  Recent Labs  07/22/14 0942 07/22/14 1203  NA 138  --   K 4.6  --   CL 108  --   CO2 23  --   GLUCOSE 109*  --   BUN 27*  --   CREATININE 1.52* 1.49*  CALCIUM 8.8  --    LFT  Recent Labs  07/22/14 0942  PROT 5.7*  ALBUMIN 2.8*  AST 43*  ALT 29  ALKPHOS 238*  BILITOT 0.4   PT/INR No results for input(s): LABPROT, INR in the last 72 hours.  Studies/Results: Dg Chest Port 1 View  07/22/2014    CLINICAL DATA:  Shortness of breath and wheezing for a few days, history hypertension, CHF, GERD  EXAM: PORTABLE CHEST - 1 VIEW  COMPARISON:  Portable exam 0930 hours compared to 06/06/2014  FINDINGS: LEFT subclavian sequential pacemaker leads project over RIGHT atrium and RIGHT ventricle.  Upper normal size of cardiac silhouette with minimal vascular congestion.  Mediastinal contours and pulmonary vascularity normal.  Atherosclerotic calcification aorta.  Slight rotation to the LEFT.  Minimal bibasilar atelectasis.  Lungs otherwise clear.  No pleural effusion or pneumothorax.  Bones demineralized with thoracic scoliosis.  Advanced LEFT glenohumeral degenerative changes.  IMPRESSION: Minimal bibasilar atelectasis.   Electronically Signed   By: Ulyses SouthwardMark  Boles M.D.   On: 07/22/2014 09:41   Impression:  1.  Worsening chronic anemia. 2.  Hemoccult-positive stools, without overt bleeding.   Given concurrent use of clopidigrel, the significance of patient's hemoccult-positive stools is unclear. 3.  Shortness of breath.  Likely multifactorial (CHF, bronchitis, anemia). 4.  Recent pelvic fractures.  Plan:  1.   Pantoprazole. 2.  Transfuse if felt needed. 3.  Oral iron repletion (which will make his stools dark). 4.  Don't plan on endoscopic evaluation at the present time, given patient's age, lack of clear-cut GI tract bleeding, and current respiratory complaints.  If patient develops worsening anemia or overt GI tract bleeding during this hospitalization, would reconsider.  Could also have further discussions as outpatient electively if patient desires, but in the absence of overt GI tract bleeding, given patient's age and multiple comorbidities, I doubt that any endoscopic finding would appreciably alter management. 5.  Will follow.   LOS: 0 days   Dwayne Begay  M  07/22/2014, 2:08 PM

## 2014-07-22 NOTE — ED Notes (Signed)
X-ray at bedside

## 2014-07-22 NOTE — Telephone Encounter (Signed)
Patients wife came in this am at 8:30 she states her [facility] and he had a really bad night. She states he has been gasping for air,he had several episodes of vomiting and had been incontinent of stool . She states he was hallucinating. I advised her to take patient to the ER for evaluation if he is having trouble breathing and having vomiting.

## 2014-07-22 NOTE — H&P (Addendum)
[facility] History and Physical  John Weaver WUJ:[id number] DOB: [birth year] DOA: 07/22/2014  Referring physician:   PCP: Margaree Mackintosh, MD   Chief Complaint: Shortness of breath   HPI:  81 y.o. male hx of HTN, HL, CHF here with shortness of breath, wheezing, gasping for air, confused with hallucinations., Also complained of nausea and vomiting but no diarrhea.. He recently had pelvic fractures from a fall and was admitted and went to rehabilitation for the last month. He started wheezing and coughing since yesterday. He was discharged from rehabilitation yesterday went home. He was unable to sleep yesterday because he was coughing so much. Also has been more confused since yesterday. Denies any fevers or chills or vomiting. Had some dark vomit this AM. No melena. On rectal exam, dark brown stool. Guiac positive      Review of Systems: negative for the following  Constitutional: Denies fever, chills, diaphoresis, appetite change and fatigue.  HEENT: Denies photophobia, eye pain, redness, hearing loss, ear pain, congestion, sore throat, rhinorrhea, sneezing, mouth sores, trouble swallowing, neck pain, neck stiffness and tinnitus.  Respiratory: Positive for cough and shortness of breath.   Cardiovascular: Denies chest pain, palpitations and leg swelling.  Gastrointestinal positive for nausea, vomiting, abdominal pain, diarrhea, constipation, blood in stool and abdominal distention.  Genitourinary: Denies dysuria, urgency, frequency, hematuria, flank pain and difficulty urinating.  Musculoskeletal: Denies myalgias, back pain, joint swelling, arthralgias and gait problem.  Skin: Denies pallor, rash and wound.  Neurological: Denies dizziness, seizures, syncope, weakness, light-headedness, numbness and headaches.  Hematological: Denies adenopathy. Easy bruising, personal or family bleeding history  Psychiatric/Behavioral: Denies suicidal ideation, mood changes, confusion, nervousness,  sleep disturbance and agitation       Past Medical History  Diagnosis Date  . Back pain   . Hypertension   . Hypercholesteremia   . Hypothyroidism   . Stroke 02/2012    tia  . History of CHF (congestive heart failure)   . Hemorrhoids   . DDD (degenerative disc disease)     of the spine  . Kidney stones   . Nephritis     at age 37  . Bradycardia     with snycope  . SSS (sick sinus syndrome)     in setting of syncope s/p PPM  . CHF (congestive heart failure)   . Dysrhythmia   . GERD (gastroesophageal reflux disease)   . Cellulitis and abscess of leg 09/16/2013    RT LEG  . Macular degeneration     right eye     Past Surgical History  Procedure Laterality Date  . Knee surgery      left knee  . Joint replacement      Left knee replacement  . Insert / replace / remove pacemaker    . Permanent pacemaker insertion N/A 10/03/2011    Procedure: PERMANENT PACEMAKER INSERTION;  Surgeon: Duke Salvia, MD;  Location: [facility]ATH LAB;  Service: Cardiovascular;  Laterality: N/A;  . Temporary pacemaker insertion N/A 10/03/2011    Procedure: TEMPORARY PACEMAKER INSERTION;  Surgeon: Quintella Reichert, MD;  Location: [facility] CATH LAB;  Service: Cardiovascular;  Laterality: N/A;      Social History:  reports that he has quit smoking. His smoking use included Pipe. He has never used smokeless tobacco. He reports that he drinks about 0.6 oz of alcohol per week. He reports that he does not use illicit drugs.    Allergies  Allergen Reactions  . Sulfa Antibiotics Rash    Family  History  Problem Relation Age of Onset  . Kidney disease Mother   . Kidney disease Father          Prior to Admission medications   Medication Sig Start Date End Date Taking? Authorizing Provider  amLODipine (NORVASC) 5 MG tablet Take 1 tablet (5 mg total) by mouth daily. 06/04/14  Yes Quintella Reichertraci R Turner, MD  Ascorbic Acid (VITAMIN C) 1000 MG tablet Take 1,000 mg by mouth daily.   Yes Historical Provider, MD   clopidogrel (PLAVIX) 75 MG tablet TAKE ONE (1) TABLET BY MOUTH EVERY DAY 04/15/14  Yes Margaree MackintoshMary J [name], MD  Diphenhydramine-APAP, sleep, (TYLENOL PM EXTRA STRENGTH PO) Take 1 tablet by mouth at bedtime as needed (for sleep).    Yes Historical Provider, MD  HYDROcodone-acetaminophen (NORCO/VICODIN) 5-325 MG per tablet Take one tablet by mouth every 6 hours as needed for moderate to severe pain 07/14/14  Yes Tiffany L Reed, DO  levothyroxine (SYNTHROID, LEVOTHROID) 100 MCG tablet TAKE ONE (1) TABLET BY MOUTH EVERY DAY 04/21/14  Yes Margaree MackintoshMary J [name], MD  Multiple Vitamins-Minerals (MULTIVITAMINS THER. W/MINERALS) TABS Take 1 tablet by mouth daily.   Yes Historical Provider, MD  omeprazole (PRILOSEC) 20 MG capsule Take 1 capsule (20 mg total) by mouth daily. 04/22/14  Yes Margaree MackintoshMary J [name], MD  PARoxetine (PAXIL) 10 MG tablet Take 1 tablet (10 mg total) by mouth daily. 01/06/14  Yes Margaree MackintoshMary J [name], MD  pravastatin (PRAVACHOL) 40 MG tablet Take 40 mg by mouth daily.   Yes Historical Provider, MD  tamsulosin (FLOMAX) 0.4 MG CAPS capsule TAKE ONE CAPSULE BY MOUTH DAILY 05/27/14  Yes Margaree MackintoshMary J [name], MD  triamcinolone cream (KENALOG) 0.1 % Apply 1 application topically daily as needed (itching).  11/13/13  Yes Historical Provider, MD     Physical Exam: Filed Vitals:   07/22/14 1030 07/22/14 1045 07/22/14 1100 07/22/14 1115  BP: 119/53 120/58 112/51 128/54  Pulse: 76 74 74 78  Temp:      TempSrc:      Resp: 21 21 19 19   SpO2: 98% 98% 98% 99%     Constitutional: Vital signs reviewed. Frail,  cooperative with exam. Alert to self and place  Head: Normocephalic and atraumatic  Ear: TM normal bilaterally  Mouth: no erythema or exudates, MMM  Eyes: PERRL, EOMI, conjunctivae normal, No scleral icterus.  Neck: Supple, Trachea midline normal ROM, No JVD, mass, thyromegaly, or carotid bruit present.  Cardiovascular: RRR, S1 normal, S2 normal, no MRG, pulses symmetric and intact bilaterally  Pulmonary/ChestCoughing, mild  retractions. Diffuse wheezing  Abdominal: Soft. Non-tender, non-distended, bowel sounds are normal, no masses, organomegaly, or guarding present.  GU: no CVA tenderness Musculoskeletal: No joint deformities, erythema, or stiffness, ROM full and no nontender Ext: no edema and no cyanosis, pulses palpable bilaterally (DP and PT)  Hematology: no cervical, inginal, or axillary adenopathy.  Neurological: Alert to self and place Strenght is normal and symmetric bilaterally, cranial nerve II-XII are grossly intact, no focal motor deficit, sensory intact to light touch bilaterally.  Skin: Warm, dry and intact. No rash, cyanosis, or clubbing.  Psychiatric: Normal mood and affect. speech and behavior is normal. Judgment and thought content normal. Cognition and memory are normal.       Labs on Admission:    Basic Metabolic Panel:  Recent Labs Lab 07/22/14 0942  NA 138  K 4.6  CL 108  CO2 23  GLUCOSE 109*  BUN 27*  CREATININE 1.52*  CALCIUM 8.8   Liver  Function Tests:  Recent Labs Lab 07/22/14 0942  AST 43*  ALT 29  ALKPHOS 238*  BILITOT 0.4  PROT 5.7*  ALBUMIN 2.8*   No results for input(s): LIPASE, AMYLASE in the last 168 hours. No results for input(s): AMMONIA in the last 168 hours. CBC:  Recent Labs Lab 07/22/14 0942  WBC 11.0*  NEUTROABS 9.1*  HGB 7.0*  HCT 21.4*  MCV 99.1  PLT 277   Cardiac Enzymes: No results for input(s): CKTOTAL, CKMB, CKMBINDEX, TROPONINI in the last 168 hours.  BNP (last 3 results)  Recent Labs  07/22/14 0942  BNP 574.0*    ProBNP (last 3 results)  Recent Labs  09/16/13 1315  PROBNP 1016.0*       CBG: No results for input(s): GLUCAP in the last 168 hours.  Radiological Exams on Admission: Dg Chest Port 1 View  07/22/2014   CLINICAL DATA:  Shortness of breath and wheezing for a few days, history hypertension, CHF, GERD  EXAM: PORTABLE CHEST - 1 VIEW  COMPARISON:  Portable exam 0930 hours compared to 06/06/2014   FINDINGS: LEFT subclavian sequential pacemaker leads project over RIGHT atrium and RIGHT ventricle.  Upper normal size of cardiac silhouette with minimal vascular congestion.  Mediastinal contours and pulmonary vascularity normal.  Atherosclerotic calcification aorta.  Slight rotation to the LEFT.  Minimal bibasilar atelectasis.  Lungs otherwise clear.  No pleural effusion or pneumothorax.  Bones demineralized with thoracic scoliosis.  Advanced LEFT glenohumeral degenerative changes.  IMPRESSION: Minimal bibasilar atelectasis.   Electronically Signed   By: Ulyses Southward M.D.   On: 07/22/2014 09:41    EKG: Independently reviewed.  Assessment/Plan Active Problems:   Acute bronchitis   Shortness of breath Likely secondary to acute bronchitis, no obvious pneumonia or congestive heart Patient will be started on nebulizer treatments, White count 11,000 Low-dose steroids, azithromycin for acute bronchitis Repeat chest x-ray in the morning to rule out pneumonia Repeat 2-D echo, place on telemetry, cycle cardiac enzymes Doubt PE as the patient is not significantly hypoxic or tachycardic at this time Negative venous Doppler last month  Nausea and vomiting Limited abdominal KUB Check UA,  Hypertension Continue antihypertensive medication at home  Hypothyroidism continue Synthroid, check TSH  Chronic kidney disease stage III, baseline 1-1.5 Creatinine at baseline we'll gently hydrate  Anemia Baseline hemoglobin around 11 Hemoglobin 7.0, guaiac-positive Dr. Dulce Sellar from [place] [facility] has been consulted by Dr. Silverio Lay Receiving 1 unit of packed red blood cells currently  Code Status:   full Family Communication: bedside Disposition Plan: admit   Time spent: 70 mins   [facility] Pager [phone]  If 7PM-7AM, please contact night-coverage [email or web address] Password [facility] 07/22/2014, 11:46 AM

## 2014-07-22 NOTE — ED Provider Notes (Signed)
CSN: [id number]     Arrival date & time 07/22/14  0908 History   First MD Initiated Contact with Patient 07/22/14 0913     Chief Complaint  Patient presents with  . Shortness of Breath     (Consider location/radiation/quality/duration/timing/severity/associated sxs/prior Treatment) The history is provided by the patient.  John Weaver is a 81 y.o. male hx of HTN, HL, CHF here with shortness of breath, wheezing. He recently had pelvic fractures from a fall and was admitted and went to rehabilitation for the last month. He started wheezing and coughing since yesterday. He was discharged from rehabilitation yesterday went home. He was unable to sleep yesterday because he was coughing so much. Also has been more confused since yesterday. Denies any fevers or chills or vomiting.    Past Medical History  Diagnosis Date  . Back pain   . Hypertension   . Hypercholesteremia   . Hypothyroidism   . Stroke 02/2012    tia  . History of CHF (congestive heart failure)   . Hemorrhoids   . DDD (degenerative disc disease)     of the spine  . Kidney stones   . Nephritis     at age 71  . Bradycardia     with snycope  . SSS (sick sinus syndrome)     in setting of syncope s/p PPM  . CHF (congestive heart failure)   . Dysrhythmia   . GERD (gastroesophageal reflux disease)   . Cellulitis and abscess of leg 09/16/2013    RT LEG  . Macular degeneration     right eye   Past Surgical History  Procedure Laterality Date  . Knee surgery      left knee  . Joint replacement      Left knee replacement  . Insert / replace / remove pacemaker    . Permanent pacemaker insertion N/A 10/03/2011    Procedure: PERMANENT PACEMAKER INSERTION;  Surgeon: Duke Salvia, MD;  Location: [facility] CATH LAB;  Service: Cardiovascular;  Laterality: N/A;  . Temporary pacemaker insertion N/A 10/03/2011    Procedure: TEMPORARY PACEMAKER INSERTION;  Surgeon: Quintella Reichert, MD;  Location: [facility] CATH LAB;  Service: Cardiovascular;   Laterality: N/A;   Family History  Problem Relation Age of Onset  . Kidney disease Mother   . Kidney disease Father    History  Substance Use Topics  . Smoking status: Former Smoker    Types: Pipe  . Smokeless tobacco: Never Used  . Alcohol Use: 0.6 oz/week    1 Glasses of wine per week    Review of Systems  Respiratory: Positive for cough and shortness of breath.   All other systems reviewed and are negative.     Allergies  Sulfa antibiotics  Home Medications   Prior to Admission medications   Medication Sig Start Date End Date Taking? Authorizing Provider  amLODipine (NORVASC) 5 MG tablet Take 1 tablet (5 mg total) by mouth daily. 06/04/14  Yes Quintella Reichert, MD  Ascorbic Acid (VITAMIN C) 1000 MG tablet Take 1,000 mg by mouth daily.   Yes Historical Provider, MD  clopidogrel (PLAVIX) 75 MG tablet TAKE ONE (1) TABLET BY MOUTH EVERY DAY 04/15/14  Yes Margaree Mackintosh, MD  Diphenhydramine-APAP, sleep, (TYLENOL PM EXTRA STRENGTH PO) Take 1 tablet by mouth at bedtime as needed (for sleep).    Yes Historical Provider, MD  HYDROcodone-acetaminophen (NORCO/VICODIN) 5-325 MG per tablet Take one tablet by mouth every 6 hours as needed for moderate  to severe pain 07/14/14  Yes Tiffany L Reed, DO  levothyroxine (SYNTHROID, LEVOTHROID) 100 MCG tablet TAKE ONE (1) TABLET BY MOUTH EVERY DAY 04/21/14  Yes Margaree MackintoshMary J [name], MD  Multiple Vitamins-Minerals (MULTIVITAMINS THER. W/MINERALS) TABS Take 1 tablet by mouth daily.   Yes Historical Provider, MD  omeprazole (PRILOSEC) 20 MG capsule Take 1 capsule (20 mg total) by mouth daily. 04/22/14  Yes Margaree MackintoshMary J [name], MD  PARoxetine (PAXIL) 10 MG tablet Take 1 tablet (10 mg total) by mouth daily. 01/06/14  Yes Margaree MackintoshMary J [name], MD  pravastatin (PRAVACHOL) 40 MG tablet Take 40 mg by mouth daily.   Yes Historical Provider, MD  tamsulosin (FLOMAX) 0.4 MG CAPS capsule TAKE ONE CAPSULE BY MOUTH DAILY 05/27/14  Yes Margaree MackintoshMary J [name], MD  triamcinolone cream (KENALOG) 0.1 %  Apply 1 application topically daily as needed (itching).  11/13/13  Yes Historical Provider, MD   BP 112/51 mmHg  Pulse 74  Temp(Src) 98 F (36.7 C) (Rectal)  Resp 19  SpO2 98% Physical Exam  Constitutional: He is oriented to person, place, and time.  Uncomfortable, audible wheezing   HENT:  Head: Normocephalic.  Mouth/Throat: Oropharynx is clear and moist.  Eyes: Conjunctivae are normal. Pupils are equal, round, and reactive to light.  Neck: Normal range of motion. Neck supple.  Cardiovascular: Normal rate, regular rhythm and normal heart sounds.   Pulmonary/Chest:  Coughing, mild retractions. Diffuse wheezing   Abdominal: Soft. Bowel sounds are normal. He exhibits no distension. There is no tenderness. There is no rebound and no guarding.  Musculoskeletal: Normal range of motion.  1+ edema bilaterally (chronic)   Neurological: He is alert and oriented to person, place, and time.  Skin: Skin is warm and dry.  Psychiatric: He has a normal mood and affect. His behavior is normal. Judgment and thought content normal.  Nursing note and vitals reviewed.   ED Course  Procedures (including critical care time)  CRITICAL CARE Performed by: Silverio LayYAO, Jujhar Everett   Total critical care time: 30 min   Critical care time was exclusive of separately billable procedures and treating other patients.  Critical care was necessary to treat or prevent imminent or life-threatening deterioration.  Critical care was time spent personally by me on the following activities: development of treatment plan with patient and/or surrogate as well as nursing, discussions with consultants, evaluation of patient's response to treatment, examination of patient, obtaining history from patient or surrogate, ordering and performing treatments and interventions, ordering and review of laboratory studies, ordering and review of radiographic studies, pulse oximetry and re-evaluation of patient's condition.   Labs  Review Labs Reviewed  CBC WITH DIFFERENTIAL/PLATELET - Abnormal; Notable for the following:    WBC 11.0 (*)    RBC 2.16 (*)    Hemoglobin 7.0 (*)    HCT 21.4 (*)    Neutrophils Relative % 83 (*)    Neutro Abs 9.1 (*)    Lymphocytes Relative 10 (*)    All other components within normal limits  COMPREHENSIVE METABOLIC PANEL - Abnormal; Notable for the following:    Glucose, Bld 109 (*)    BUN 27 (*)    Creatinine, Ser 1.52 (*)    Total Protein 5.7 (*)    Albumin 2.8 (*)    AST 43 (*)    Alkaline Phosphatase 238 (*)    GFR calc non Af Amer 37 (*)    GFR calc Af Amer 43 (*)    All other components within normal limits  BRAIN NATRIURETIC PEPTIDE - Abnormal; Notable for the following:    B Natriuretic Peptide 574.0 (*)    All other components within normal limits  I-STAT TROPOININ, ED  I-STAT CG4 LACTIC ACID, ED  POC OCCULT BLOOD, ED  PREPARE RBC (CROSSMATCH)    Imaging Review Dg Chest Port 1 View  07/22/2014   CLINICAL DATA:  Shortness of breath and wheezing for a few days, history hypertension, CHF, GERD  EXAM: PORTABLE CHEST - 1 VIEW  COMPARISON:  Portable exam 0930 hours compared to 06/06/2014  FINDINGS: LEFT subclavian sequential pacemaker leads project over RIGHT atrium and RIGHT ventricle.  Upper normal size of cardiac silhouette with minimal vascular congestion.  Mediastinal contours and pulmonary vascularity normal.  Atherosclerotic calcification aorta.  Slight rotation to the LEFT.  Minimal bibasilar atelectasis.  Lungs otherwise clear.  No pleural effusion or pneumothorax.  Bones demineralized with thoracic scoliosis.  Advanced LEFT glenohumeral degenerative changes.  IMPRESSION: Minimal bibasilar atelectasis.   Electronically Signed   By: Ulyses Southward M.D.   On: 07/22/2014 09:41     EKG Interpretation   Date/Time:  Tuesday July 22 2014 09:21:50 EDT Ventricular Rate:  80 PR Interval:  318 QRS Duration: 146 QT Interval:  415 QTC Calculation: 479 R Axis:   -78 Text  Interpretation:  Sinus rhythm Prolonged PR interval RBBB and LAFB No  significant change since last tracing Confirmed by Samik Balkcom  MD, Brandyn Lowrey ([id number])  on 07/22/2014 9:36:51 AM      MDM   Final diagnoses:  Shortness of breath   John Weaver is a 81 y.o. male here with cough. Likely bronchitis vs pneumonia vs CHF. Will get labs, BNP, CXR. Will give nebs and likely admit.   11:25 AM Hg 7.0. Had some dark vomit this AM. No melena. On rectal exam, dark brown stool. Guiac positive. I think likely symptomatic anemia in addition to bronchitis. Given nebs, still wheezing. Transfused 1 U PRBC. Consulted Dr. Dulce Sellar (covering Dr. Matthias Hughs) from GI. Will admit to hospitalist for symptomatic anemia, GI bleed, bronchitis.     Richardean Canal, MD 07/22/14 1126

## 2014-07-22 NOTE — ED Notes (Signed)
Pt presents from home via POV with c/o increased SOB and wheezing that began yesterday.  Patient was dismissed from rehab yesterday after 28 days and his wife reports he had a neb treatment prior to leaving [facility] yesterday but his wheezing and SOB became worse overnight.

## 2014-07-22 NOTE — Telephone Encounter (Signed)
Agree he needs to go to ED.

## 2014-07-23 DIAGNOSIS — R0600 Dyspnea, unspecified: Secondary | ICD-10-CM

## 2014-07-23 DIAGNOSIS — R0602 Shortness of breath: Secondary | ICD-10-CM

## 2014-07-23 DIAGNOSIS — D638 Anemia in other chronic diseases classified elsewhere: Secondary | ICD-10-CM | POA: Insufficient documentation

## 2014-07-23 DIAGNOSIS — E039 Hypothyroidism, unspecified: Secondary | ICD-10-CM

## 2014-07-23 DIAGNOSIS — J209 Acute bronchitis, unspecified: Principal | ICD-10-CM

## 2014-07-23 DIAGNOSIS — E785 Hyperlipidemia, unspecified: Secondary | ICD-10-CM

## 2014-07-23 LAB — COMPREHENSIVE METABOLIC PANEL
ALT: 26 U/L (ref 0–53)
AST: 37 U/L (ref 0–37)
Albumin: 3 g/dL — ABNORMAL LOW (ref 3.5–5.2)
Alkaline Phosphatase: 219 U/L — ABNORMAL HIGH (ref 39–117)
Anion gap: 10 (ref 5–15)
BILIRUBIN TOTAL: 1 mg/dL (ref 0.3–1.2)
BUN: 24 mg/dL — AB (ref 6–23)
CHLORIDE: 107 mmol/L (ref 96–112)
CO2: 21 mmol/L (ref 19–32)
Calcium: 8.9 mg/dL (ref 8.4–10.5)
Creatinine, Ser: 1.47 mg/dL — ABNORMAL HIGH (ref 0.50–1.35)
GFR calc non Af Amer: 38 mL/min — ABNORMAL LOW (ref >90)
GFR, EST AFRICAN AMERICAN: 45 mL/min — AB (ref >90)
GLUCOSE: 94 mg/dL (ref 70–99)
Potassium: 4.2 mmol/L (ref 3.5–5.1)
SODIUM: 138 mmol/L (ref 135–145)
TOTAL PROTEIN: 5.8 g/dL — AB (ref 6.0–8.3)

## 2014-07-23 LAB — TYPE AND SCREEN
ABO/RH(D): O POS
Antibody Screen: NEGATIVE
Unit division: 0

## 2014-07-23 LAB — URINALYSIS, ROUTINE W REFLEX MICROSCOPIC
BILIRUBIN URINE: NEGATIVE
GLUCOSE, UA: NEGATIVE mg/dL
Hgb urine dipstick: NEGATIVE
Ketones, ur: NEGATIVE mg/dL
NITRITE: NEGATIVE
Protein, ur: NEGATIVE mg/dL
SPECIFIC GRAVITY, URINE: 1.014 (ref 1.005–1.030)
Urobilinogen, UA: 0.2 mg/dL (ref 0.0–1.0)
pH: 5.5 (ref 5.0–8.0)

## 2014-07-23 LAB — CBC
HEMATOCRIT: 24.2 % — AB (ref 39.0–52.0)
Hemoglobin: 8 g/dL — ABNORMAL LOW (ref 13.0–17.0)
MCH: 32.3 pg (ref 26.0–34.0)
MCHC: 33.1 g/dL (ref 30.0–36.0)
MCV: 97.6 fL (ref 78.0–100.0)
Platelets: 272 10*3/uL (ref 150–400)
RBC: 2.48 MIL/uL — ABNORMAL LOW (ref 4.22–5.81)
RDW: 13.9 % (ref 11.5–15.5)
WBC: 9 10*3/uL (ref 4.0–10.5)

## 2014-07-23 LAB — URINE MICROSCOPIC-ADD ON

## 2014-07-23 LAB — TROPONIN I: Troponin I: <0.03 ng/mL (ref <0.031)

## 2014-07-23 MED ORDER — LEVOFLOXACIN 500 MG PO TABS: 500.0000 mg | ORAL_TABLET | Freq: Every day | ORAL | Status: DC

## 2014-07-23 MED ORDER — BUDESONIDE 0.25 MG/2ML IN SUSP
0.2500 mg | Freq: Two times a day (BID) | RESPIRATORY_TRACT | Status: DC
  Administered 2014-07-23 – 2014-07-26 (×6): 0.25 mg via RESPIRATORY_TRACT
  Filled 2014-07-23 (×8): qty 2

## 2014-07-23 MED ORDER — LEVOTHYROXINE SODIUM 50 MCG PO TABS
50.0000 ug | ORAL_TABLET | Freq: Every day | ORAL | Status: DC
  Administered 2014-07-24: 50 ug via ORAL
  Filled 2014-07-23 (×2): qty 1

## 2014-07-23 MED ORDER — LEVALBUTEROL HCL 0.63 MG/3ML IN NEBU
0.6300 mg | INHALATION_SOLUTION | Freq: Four times a day (QID) | RESPIRATORY_TRACT | Status: DC
  Administered 2014-07-23 – 2014-07-26 (×11): 0.63 mg via RESPIRATORY_TRACT
  Filled 2014-07-23 (×11): qty 3

## 2014-07-23 MED ORDER — LEVOFLOXACIN 500 MG PO TABS
500.0000 mg | ORAL_TABLET | Freq: Once | ORAL | Status: AC
  Administered 2014-07-23: 500 mg via ORAL
  Filled 2014-07-23: qty 1

## 2014-07-23 MED ORDER — FUROSEMIDE 10 MG/ML IJ SOLN
20.0000 mg | Freq: Once | INTRAMUSCULAR | Status: AC
  Administered 2014-07-23: 20 mg via INTRAVENOUS
  Filled 2014-07-23: qty 2

## 2014-07-23 MED ORDER — LEVOFLOXACIN 250 MG PO TABS
250.0000 mg | ORAL_TABLET | ORAL | Status: DC
  Administered 2014-07-24: 250 mg via ORAL
  Filled 2014-07-23: qty 1

## 2014-07-23 NOTE — Progress Notes (Signed)
[name]  John NeighborsFritz Weaver ZOX:[id number] DOB: Nov 12, 1915 DOA: 07/22/2014 PCP: John MackintoshBAXLEY,[name], MD  Assessment/Plan: 1-Shortness of breath: ongoing; still with significant wheezing/rhonchi, mild bibasilar crackles and difficulty speaking in full sentences. -Patient will be started on Pulmicort, continue Levaquin for bronchitis/early pneumonia process; continue nebulizer therapy -Repeat chest x-ray in a.m. -given mild bibasilar crackles will do one dose of Lasix IV and follow daily weights and strict intake and output. -Patient had a history of diastolic heart failure most likely associated with aortic valve stenosis -follow clinical response.  2-acute on chronic anemia: With positive fecal occult blood test -Patient chronically on Plavix for stroke secondary prevention -Per GI recommendations no endoscopy at this moment -Patient has received 1 unit of blood and hemoglobin is now trending up -No signs of overt bleeding and is hemodynamically stable -Will follow trend -continue PPI and iron supplementation  3-depression/anxiety: Continue Paxil -Mood is a stable and there is no signs of suicidal ideation   4-hypertension: Continue amlodipine  5-hypothyroidism: continue Synthroid  6-hyperlipidemia: Continue Pravachol  7-BPH: Continue Flomax  8-history of stroke/TIA: No new neurologic deficit appreciated. -Plavix on hold secondary to positive fecal occult blood test and acute on chronic anemia.  Code Status: full code Family Communication: no family at bedside Disposition Plan: to be determine   Consultants:  GI (Dr. Dulce Sellarutlaw)  Procedures:  2-D echo - Left ventricle: The cavity size was normal. Wall thickness was normal. Systolic function was normal. The estimated ejection fraction was in the range of 60% to 65%. Wall motion was normal; there were no regional wall motion abnormalities. - Aortic valve: There was mild to moderate stenosis. There  was trivial regurgitation. Valve area (VTI): 0.98 cm^2. Valve area (Vmax): 1.14 cm^2. Valve area (Vmean): 0.99 cm^2. - Mitral valve: Calcified annulus. There was mild regurgitation. - Right atrium: The atrium was mildly dilated. - Pulmonary arteries: Systolic pressure was moderately increased. PA peak pressure: 41 mm Hg (S).  Antibiotics:  Zithromax 4/19>>4/20  Levaquin 4/20  HPI/Subjective: Tachypneic, afebrile and denying chest pain. Patient with difficulty speaking in full sentences and with audible wheezing on exam  Objective: Filed Vitals:   07/23/14 0902  BP: 112/54  Pulse: 75  Temp: 97.8 F (36.6 C)  Resp: 18    Intake/Output Summary (Last 24 hours) at 07/23/14 1655 Last data filed at 07/23/14 1350  Gross per 24 hour  Intake  652.5 ml  Output    625 ml  Net   27.5 ml   Filed Weights   07/22/14 1252  Weight: 61.7 kg (136 lb 0.4 oz)    Exam:   General:  Afebrile, denying chest pain, but is complaining of mild shortness of breath. Patient with audible wheezing on exam and experiencing difficulty speaking in full sentences. Good oxygen saturation on room air  Cardiovascular: positive systolic murmur, regular rate, no rubs, no gallops; no JVD appreciated on exam.  Respiratory: respiratory rate of 23 during my examination; positive expiratory wheezing and diffuse crackles/rhonchi bilaterally  Abdomen: soft, nontender, nondistended, positive bowel sounds  Musculoskeletal: 1+ lower extremity edema bilaterally, no cyanosis or clubbing  Data Reviewed: Basic Metabolic Panel:  Recent Labs Lab 07/22/14 0942 07/22/14 1203 07/23/14 0527  NA 138  --  138  K 4.6  --  4.2  CL 108  --  107  CO2 23  --  21  GLUCOSE 109*  --  94  BUN 27*  --  24*  CREATININE 1.52* 1.49* 1.47*  CALCIUM 8.8  --  8.9  MG  --  2.2  --    Liver Function Tests:  Recent Labs Lab 07/22/14 0942 07/23/14 0527  AST 43* 37  ALT 29 26  ALKPHOS 238* 219*  BILITOT 0.4 1.0   PROT 5.7* 5.8*  ALBUMIN 2.8* 3.0*   CBC:  Recent Labs Lab 07/22/14 0942 07/22/14 1203 07/23/14 0527  WBC 11.0* 10.4 9.0  NEUTROABS 9.1*  --   --   HGB 7.0* 7.4* 8.0*  HCT 21.4* 22.5* 24.2*  MCV 99.1 99.1 97.6  PLT 277 268 272   Cardiac Enzymes:  Recent Labs Lab 07/22/14 1203 07/22/14 1943 07/22/14 2337  TROPONINI <0.03 0.03 <0.03   BNP (last 3 results)  Recent Labs  07/22/14 0942  BNP 574.0*    ProBNP (last 3 results)  Recent Labs  09/16/13 1315  PROBNP 1016.0*   Studies: Dg Chest Port 1 View  07/22/2014   CLINICAL DATA:  Shortness of breath and wheezing for a few days, history hypertension, CHF, GERD  EXAM: PORTABLE CHEST - 1 VIEW  COMPARISON:  Portable exam 0930 hours compared to 06/06/2014  FINDINGS: LEFT subclavian sequential pacemaker leads project over RIGHT atrium and RIGHT ventricle.  Upper normal size of cardiac silhouette with minimal vascular congestion.  Mediastinal contours and pulmonary vascularity normal.  Atherosclerotic calcification aorta.  Slight rotation to the LEFT.  Minimal bibasilar atelectasis.  Lungs otherwise clear.  No pleural effusion or pneumothorax.  Bones demineralized with thoracic scoliosis.  Advanced LEFT glenohumeral degenerative changes.  IMPRESSION: Minimal bibasilar atelectasis.   Electronically Signed   By: Ulyses Southward M.D.   On: 07/22/2014 09:41   Dg Abd Portable 1v  07/22/2014   CLINICAL DATA:  GI bleed.  Fluid like symptoms  EXAM: PORTABLE ABDOMEN - 1 VIEW  COMPARISON:  CT abdomen pelvis 07/27/2006  FINDINGS: Moderate to severe levoscoliosis of the lumbar spine with multilevel degenerative change.  Fracture of the right superior inferior pubic rami. Avulsion fracture of the ischemic tuberosity. These may be subacute. Question history of trauma. Tumor a consideration but considered less likely.  Nonobstructive bowel gas pattern.  6 mm left renal calculus.  IMPRESSION: Fractures of the right superior and inferior pubic rami,  question history trauma. CT may be helpful for further evaluation.  Nonobstructive bowel gas pattern  Left renal calculus.   Electronically Signed   By: Marlan Palau M.D.   On: 07/22/2014 14:53    Scheduled Meds: . sodium chloride  10 mL/hr Intravenous Once  . amLODipine  5 mg Oral Daily  . budesonide (PULMICORT) nebulizer solution  0.25 mg Nebulization BID  . ferrous sulfate  325 mg Oral BID WC  . furosemide  20 mg Intravenous Once  . heparin  5,000 Units Subcutaneous 3 times per day  . [START ON 07/24/2014] levothyroxine  50 mcg Oral QAC breakfast  . pantoprazole  40 mg Oral BID  . PARoxetine  10 mg Oral Daily  . pravastatin  40 mg Oral Daily  . sodium chloride  3 mL Intravenous Q12H  . tamsulosin  0.4 mg Oral Daily   Continuous Infusions:   Active Problems:   Acute bronchitis    Time spent: 30 minutes    Vassie Loll  [facility] Pager [phone]. If 7PM-7AM, please contact night-coverage at [email or web address], password [facility] 07/23/2014, 4:55 PM  LOS: 1 day

## 2014-07-23 NOTE — Progress Notes (Signed)
Pt. Displays intermittent expiratory wheezing during day.  Neb. trx given prn. Spo2 100% RA.  Dr. Gwenlyn PerkingMadera notified this afternoon.

## 2014-07-23 NOTE — Progress Notes (Signed)
Echocardiogram 2D Echocardiogram has been performed.  John BasemanReel, John Weaver M 07/23/2014, 3:54 PM

## 2014-07-23 NOTE — Progress Notes (Signed)
Utilization review completed. Danilo Cappiello, RN, BSN. 

## 2014-07-24 ENCOUNTER — Telehealth: Payer: Self-pay | Admitting: Internal Medicine

## 2014-07-24 ENCOUNTER — Inpatient Hospital Stay (HOSPITAL_COMMUNITY): Payer: Medicare Other

## 2014-07-24 DIAGNOSIS — R0600 Dyspnea, unspecified: Secondary | ICD-10-CM

## 2014-07-24 DIAGNOSIS — Z66 Do not resuscitate: Secondary | ICD-10-CM

## 2014-07-24 DIAGNOSIS — R5381 Other malaise: Secondary | ICD-10-CM

## 2014-07-24 DIAGNOSIS — Z515 Encounter for palliative care: Secondary | ICD-10-CM

## 2014-07-24 DIAGNOSIS — I5033 Acute on chronic diastolic (congestive) heart failure: Secondary | ICD-10-CM

## 2014-07-24 MED ORDER — FUROSEMIDE 10 MG/ML IJ SOLN
20.0000 mg | Freq: Two times a day (BID) | INTRAMUSCULAR | Status: DC
  Administered 2014-07-24 – 2014-07-26 (×5): 20 mg via INTRAVENOUS
  Filled 2014-07-24 (×7): qty 2

## 2014-07-24 NOTE — Evaluation (Signed)
Physical Therapy Evaluation Patient Details Name: John NeighborsFritz Weaver MRN: [id number] DOB: 1916/03/20 Today's Date: 07/24/2014   History of Present Illness  Pt. is 81 year old male with recent admission for R sided pubic rami fx and R greater trochanteric fx who [facility] on 07/[facility]/14.  Pt. had coughing and SOB , [facility] with acute bronchitis.  Pt. with h/o HTN, anemia of chronic disease.  Pt. had been managed [facility] for pelvic and trochanteric fxs, however per his wife, he was increased to WBAT at least 10 days ago.    Clinical Impression  Pt. Presents to PT with a significant decline in his functional mobility and gait as a result of this acute illness (wife reports he was walking with RW hallway distances at [facility]).  He will benefit from acute PT to address his functional mobility and gait deficits as well as the below issues.  Pt. Will benefit from post acute rehab at SNF .  I do not believe he can currently tolerate a CIR environment.      Follow Up Recommendations SNF (if he has remaining SNF days under his insurance)    Equipment Recommendations  None recommended by PT    Recommendations for Other Services       Precautions / Restrictions Precautions Precautions: Fall Restrictions Weight Bearing Restrictions: No      Mobility  Bed Mobility Overal bed mobility: Needs Assistance Bed Mobility: Supine to Sit     Supine to sit: Mod assist     General bed mobility comments: Pt. able to move LEs over toward EOB but needed mod at trunk to rise to sitting position  Transfers Overall transfer level: Needs assistance Equipment used: Rolling walker (2 wheeled) Transfers: Sit to/from Stand Sit to Stand: Mod assist;+2 safety/equipment         General transfer comment: needed heavy cueing for safety and hand placement; mod assist to power up; knees appear weak and buckle mildly.  Pt. practiced  4 trials of sit<>stand prior to transfer to  recliner  Ambulation/Gait Ambulation/Gait assistance: Mod assist;+2 safety/equipment Ambulation Distance (Feet): 2 Feet Assistive device: Rolling walker (2 wheeled) Gait Pattern/deviations: Step-to pattern     General Gait Details: Pt. needed mod assist for RW management and stability in taking 2 turning steps to recliner  Stairs            Wheelchair Mobility    Modified Rankin (Stroke Patients Only)       Balance Overall balance assessment: Needs assistance Sitting-balance support: No upper extremity supported;Feet supported Sitting balance-Leahy Scale: Fair     Standing balance support: Bilateral upper extremity supported;During functional activity Standing balance-Leahy Scale: Poor Standing balance comment: needed mod assist and support of RW for standing at bedside                             Pertinent Vitals/Pain Pain Assessment: Faces Faces Pain Scale: No hurt  Pt. With audible wheezing with activity.  Pulse oximeter not working accurately during mobility determine his O2 saturations.  He was not dyspnic.    Home Living Family/patient expects to be discharged to:: Skilled nursing facility                 Additional Comments: Wife indicates she had hoped to be able to manage pt. at home, but given his currentl level of weakness, she plans for him to go back to SNF.  Prior Function Level of Independence: Independent with assistive device(s)         Comments: uses RW prior to fxs in March     Hand Dominance        Extremity/Trunk Assessment   Upper Extremity Assessment: Generalized weakness           Lower Extremity Assessment: Generalized weakness      Cervical / Trunk Assessment: Normal  Communication   Communication: No difficulties (heavy Viennese accent)  Cognition Arousal/Alertness: Awake/alert Behavior During Therapy: WFL for tasks assessed/performed Overall Cognitive Status: Impaired/Different from  baseline Area of Impairment: Orientation;Attention;Memory;Following commands;Safety/judgement Orientation Level: Disoriented to;Time;Situation;Place Current Attention Level: Focused Memory: Decreased recall of precautions;Decreased short-term memory Following Commands: Follows one step commands with increased time Safety/Judgement: Decreased awareness of safety;Decreased awareness of deficits          General Comments      Exercises        Assessment/Plan    PT Assessment Patient needs continued PT services  PT Diagnosis Generalized weakness;Difficulty walking   PT Problem List Decreased strength;Decreased activity tolerance;Decreased balance;Decreased mobility;Decreased knowledge of use of DME;Decreased safety awareness;Cardiopulmonary status limiting activity  PT Treatment Interventions DME instruction;Gait training;Functional mobility training;Therapeutic activities;Therapeutic exercise;Balance training;Patient/family education   PT Goals (Current goals can be found in the Care Plan section) Acute Rehab PT Goals Patient Stated Goal: wife needs pt. to be stronger and more ambulatory PT Goal Formulation: With patient Time For Goal Achievement: 08/07/14 Potential to Achieve Goals: Good    Frequency Min 3X/week   Barriers to discharge Decreased caregiver support wife does not believe she can manage her husband at his present functional level    Co-evaluation               End of Session Equipment Utilized During Treatment: Gait belt               Time: 1332-1355 PT Time Calculation (min) (ACUTE ONLY): 23 min   Charges:   PT Evaluation $Initial PT Evaluation Tier I: 1 Procedure PT Treatments $Gait Training: 8-22 mins   PT G CodesFerman Hamming 07/24/2014, 2:44 PM Weldon Picking PT Acute Rehab Services [phone] Beeper [phone]

## 2014-07-24 NOTE — Clinical Social Work Note (Signed)
Clinical Social Work Assessment  Patient Details  Name: John Weaver MRN: [id number] Date of Birth: 13-Jun-1915  Date of referral:  07/24/14               Reason for consult:  Facility Placement                Permission sought to share information with:  Family Supports Permission granted to share information::     Name::     John Weaver  Agency::     Relationship::  Wife   Contact Information:  John Weaver- Wife- [phone]  Housing/Transportation Living arrangements for the past 2 months:  Skilled Nursing Facility, Apartment ([facility] and spent 1 night at home prior to admission) Source of Information:  Spouse Patient Interpreter Needed:  None Criminal Activity/Legal Involvement Pertinent to Current Situation/Hospitalization:  No - Comment as needed Significant Relationships:  Spouse Lives with:  Spouse Do you feel safe going back to the place where you live?  Yes Need for family participation in patient care:  Yes (Comment)  Care giving concerns:   Wife is unable to manage her husband at home.   Social Worker assessment / plan:  Upon entering the room John Weaver was sitting up in the recliner chair with his wife John Weaver at bedside. CSW-Intern talked with Mrs. Hale BogusPorter regarding short-term rehab and she  is agreeable, however she stated that he will eventually need long term care after rehab. John Weaver reported that patient has trouble walking and she  is unable to manage him at home. John Weaver reported that her [facility] was [facility] and stayed for 27 days before bringing him home. John Weaver has consulted with her husband's primary care physician for suggestions regarding placements.    Employment status:  Retired Health and safety inspectornsurance information:  Forensic psychologistMedicare (Blue Medicare ) PT Recommendations:  Skilled Nursing Facility Information / Referral to community resources:  Skilled Nursing Facility   Patient/Family's Response to care:  John Weaver is aware that her husband not only needs more rehab but mostly likely long term care as well.   John Weaver was smiling and quiet while CSW-Intern spoke with his wife.     Patient/Family's Understanding of and Emotional Response to Diagnosis, Current Treatment, and Prognosis:    Emotional Assessment Appearance:  Appears younger than stated age, Well-Groomed Attitude/Demeanor/Rapport:  Other (Patient was up in chair and was smiling upon CSW-Intern entering the room) Affect (typically observed):  Stable, Quiet, Calm, Happy Orientation:  Oriented to Self Alcohol / Substance use:  Never Used Psych involvement (Current and /or in the community):  No (Comment)  Discharge Needs  Concerns to be addressed:  Discharge Planning Concerns Readmission within the last 30 days:  Yes Current discharge risk:  None Barriers to Discharge:  No Barriers Identified   Jeanelle Dake A, Student-SW 07/24/2014, 2:41 PM

## 2014-07-24 NOTE — Progress Notes (Signed)
Subjective: No reported blood in stool or emesis. Is increasingly confused; can't answer most questions  Objective: Vital signs in last 24 hours: Temp:  [97.5 F (36.4 C)-97.8 F (36.6 C)] 97.8 F (36.6 C) (04/21 0548) Pulse Rate:  [75-88] 86 (04/21 0548) Resp:  [16-19] 19 (04/21 0548) BP: (112-126)/(54-59) 122/59 mmHg (04/21 0548) SpO2:  [96 %-100 %] 96 % (04/21 0548) Weight:  [61.4 kg (135 lb 5.8 oz)] 61.4 kg (135 lb 5.8 oz) (04/20 2100) Weight change: -0.3 kg (-10.6 oz) Last BM Date: 07/22/14  PE: GEN:  Alert, but confused, no acute distress ABD:  Soft, non-tender  Lab Results: CBC    Component Value Date/Time   WBC 9.0 07/23/2014 0527   RBC 2.48* 07/23/2014 0527   RBC 3.23* 10/02/2011 0816   HGB 8.0* 07/23/2014 0527   HCT 24.2* 07/23/2014 0527   PLT 272 07/23/2014 0527   MCV 97.6 07/23/2014 0527   MCH 32.3 07/23/2014 0527   MCHC 33.1 07/23/2014 0527   RDW 13.9 07/23/2014 0527   LYMPHSABS 1.1 07/22/2014 0942   MONOABS 0.7 07/22/2014 0942   EOSABS 0.1 07/22/2014 0942   BASOSABS 0.0 07/22/2014 0942   CMP     Component Value Date/Time   NA 138 07/23/2014 0527   K 4.2 07/23/2014 0527   CL 107 07/23/2014 0527   CO2 21 07/23/2014 0527   GLUCOSE 94 07/23/2014 0527   BUN 24* 07/23/2014 0527   CREATININE 1.47* 07/23/2014 0527   CREATININE 1.55* 10/24/2013 1020   CALCIUM 8.9 07/23/2014 0527   PROT 5.8* 07/23/2014 0527   ALBUMIN 3.0* 07/23/2014 0527   AST 37 07/23/2014 0527   ALT 26 07/23/2014 0527   ALKPHOS 219* 07/23/2014 0527   BILITOT 1.0 07/23/2014 0527   GFRNONAA 38* 07/23/2014 0527   GFRAA 45* 07/23/2014 0527   Assessment:  1. Worsening chronic anemia.  Stable during this hospitalization. 2. Hemoccult-positive stools, without overt bleeding. Given concurrent use of clopidigrel, the significance of patient's hemoccult-positive stools is unclear. 3. Shortness of breath. Likely multifactorial (CHF, bronchitis, anemia). 4. Recent pelvic  fractures. 5.  Confusion, worsening.  Likely multifactorial (infection, sundowning).  Plan:  1.  Continue PPI. 2.  Advance diet as patient's overall respiratory condition permits. 3.  Would not pursue endoscopic evaluation this admission in absence of overt destabilizing bleeding. 4.  Will sign-off; please call with questions; thank you for the consultation.   Freddy JakschOUTLAW,Dekari Bures M 07/24/2014, 8:38 AM

## 2014-07-24 NOTE — Progress Notes (Signed)
[name]  John Weaver ZOX:[id number] DOB: Sep 02, 1915 DOA: 07/22/2014 PCP: Margaree Mackintosh, MD  Assessment/Plan: 1-Shortness of breath: ongoing; still with significant wheezing/rhonchi, mild bibasilar crackles and difficulty speaking in full sentences. -Patient will continue Pulmicort, continue Levaquin for bronchitis/early pneumonia process; continue nebulizer therapy -Patient had a history of diastolic heart failure most likely associated with aortic valve stenosis; CXR revealing vascular congestion and bilat pleural effusion -will start tx with IV lasix BID now and follow daily weights and strict I's and O's -follow clinical response. -per family request will involved palliative care to assess patient as well and help with logistics and transition to comfort if appropriate.  2-acute on chronic anemia: With positive fecal occult blood test -Patient chronically on Plavix for stroke secondary prevention -Per GI recommendations no endoscopy at this moment -Patient has received 1 unit of blood and hemoglobin is now trending up -No signs of overt bleeding and is hemodynamically stable -Will follow trend -continue PPI and iron supplementation -Hgb 8.0  3-depression/anxiety: Continue Paxil -Mood is a stable and there is no signs of suicidal ideation  -patient is pleasantly confused   4-hypertension: BP is stable -will hold amlodipine -plan is for lasix to be given IV BID; will like to have room for diuresis  5-hypothyroidism: continue Synthroid  6-hyperlipidemia: after discussing with wife, statins will be discontinued, looking to treat conservately and minimizing medications.  7-BPH: Continue Flomax  8-history of stroke/TIA: No new neurologic deficit appreciated. -Plavix on hold secondary to positive fecal occult blood test and acute on chronic anemia.  Code Status: discussed with wife, patient is DNR and no looking for any heroic intervention (no tube  feedings, etc...) Family Communication:wife at bedside Disposition Plan: to be determine; per PT recommendations will needs SNF. Patient is very deconditioned and requiring a lot of assistance. Also with poor appetite    Consultants:  GI (Dr. Dulce Sellar)  Procedures:  2-D echo - Left ventricle: The cavity size was normal. Wall thickness was normal. Systolic function was normal. The estimated ejection fraction was in the range of 60% to 65%. Wall motion was normal; there were no regional wall motion abnormalities. - Aortic valve: There was mild to moderate stenosis. There was trivial regurgitation. Valve area (VTI): 0.98 cm^2. Valve area (Vmax): 1.14 cm^2. Valve area (Vmean): 0.99 cm^2. - Mitral valve: Calcified annulus. There was mild regurgitation. - Right atrium: The atrium was mildly dilated. - Pulmonary arteries: Systolic pressure was moderately increased. PA peak pressure: 41 mm Hg (S).  Antibiotics:  Zithromax 4/19>>4/20  Levaquin 4/20  HPI/Subjective: Slightly better in terms of breathing easier according to family member, but he is still congested and with positive JVD. repeated CXR demonstrating bilateral pleural effusion and vascular congestion, along with bronchitic changes. No fever.    Objective: Filed Vitals:   07/24/14 1555  BP: 117/78  Pulse: 97  Temp: 98.7 F (37.1 C)  Resp: 17    Intake/Output Summary (Last 24 hours) at 07/24/14 1623 Last data filed at 07/24/14 1314  Gross per 24 hour  Intake    318 ml  Output    200 ml  Net    118 ml   Filed Weights   07/22/14 1252 07/23/14 2100  Weight: 61.7 kg (136 lb 0.4 oz) 61.4 kg (135 lb 5.8 oz)    Exam:   General:  Afebrile, denying chest pain, and breathing slightly better; but still not at baseline and with significant congestion sounds. Good oxygen saturation on room air  Cardiovascular:  positive systolic murmur, regular rate, no rubs, no gallops; no JVD appreciated on exam. Mild  JVD  Respiratory: respiratory rate of 20 during my examination; mild expiratory wheezing and diffuse crackles/rhonchi bilaterally  Abdomen: soft, nontender, nondistended, positive bowel sounds  Musculoskeletal: trace lower extremity edema bilaterally, no cyanosis or clubbing  Data Reviewed: Basic Metabolic Panel:  Recent Labs Lab 07/22/14 0942 07/22/14 1203 07/23/14 0527  NA 138  --  138  K 4.6  --  4.2  CL 108  --  107  CO2 23  --  21  GLUCOSE 109*  --  94  BUN 27*  --  24*  CREATININE 1.52* 1.49* 1.47*  CALCIUM 8.8  --  8.9  MG  --  2.2  --    Liver Function Tests:  Recent Labs Lab 07/22/14 0942 07/23/14 0527  AST 43* 37  ALT 29 26  ALKPHOS 238* 219*  BILITOT 0.4 1.0  PROT 5.7* 5.8*  ALBUMIN 2.8* 3.0*   CBC:  Recent Labs Lab 07/22/14 0942 07/22/14 1203 07/23/14 0527  WBC 11.0* 10.4 9.0  NEUTROABS 9.1*  --   --   HGB 7.0* 7.4* 8.0*  HCT 21.4* 22.5* 24.2*  MCV 99.1 99.1 97.6  PLT 277 268 272   Cardiac Enzymes:  Recent Labs Lab 07/22/14 1203 07/22/14 1943 07/22/14 2337  TROPONINI <0.03 0.03 <0.03   BNP (last 3 results)  Recent Labs  07/22/14 0942  BNP 574.0*    ProBNP (last 3 results)  Recent Labs  09/16/13 1315  PROBNP 1016.0*   Studies: Dg Chest 2 View  07/24/2014   CLINICAL DATA:  Shortness of Breath  EXAM: CHEST  2 VIEW  COMPARISON:  07/22/2014  FINDINGS: Cardiac shadow is stable. A pacing device is again identified. Small bilateral pleural effusions are noted but appear improved on the frontal exam when compared with the prior study. No new focal abnormality is seen. Degenerative changes of the left shoulder joint are again seen  IMPRESSION: Bilateral pleural effusions although the overall appearance has improved on the frontal film when compared with the prior exam. Continued followup is recommended.   Electronically Signed   By: Alcide CleverMark  Lukens M.D.   On: 07/24/2014 09:45    Scheduled Meds: . sodium chloride  10 mL/hr Intravenous  Once  . budesonide (PULMICORT) nebulizer solution  0.25 mg Nebulization BID  . ferrous sulfate  325 mg Oral BID WC  . furosemide  20 mg Intravenous BID  . heparin  5,000 Units Subcutaneous 3 times per day  . levalbuterol  0.63 mg Nebulization Q6H  . levofloxacin  250 mg Oral Q24H  . levothyroxine  50 mcg Oral QAC breakfast  . pantoprazole  40 mg Oral BID  . PARoxetine  10 mg Oral Daily  . sodium chloride  3 mL Intravenous Q12H  . tamsulosin  0.4 mg Oral Daily   Continuous Infusions:   Active Problems:   Acute bronchitis   Anemia of chronic disease    Time spent: 30 minutes    Vassie LollMadera, John Weaver  [facility] Pager [phone]. If 7PM-7AM, please contact night-coverage at [email or web address], password [email or web address] 07/24/2014, 4:23 PM  LOS: 2 days

## 2014-07-24 NOTE — Progress Notes (Signed)
Telemetry D/c'd. Order to d/c after 48 hours and patient has repeatedly snatched off chest throughout night. Notified CCMD

## 2014-07-24 NOTE — Telephone Encounter (Signed)
LMOM for wife, John Weaver that Dr. Lenord FellersBaxley had spoken with another colleague and that the only Skilled Nursing Facilities on [address] were [facility] and [facility].  Both had been discussed with the wife in terms of their facility capabilities and the patient needs.  [facility] is not an option because the patient has to have Skilled Nursing at this phase of life.  Patient has not been determined to need Palliative or Hospice Care at this particular time per the hospital.

## 2014-07-24 NOTE — Clinical Social Work Placement (Signed)
   CLINICAL SOCIAL WORK PLACEMENT  NOTE  Date:  07/24/2014  Patient Details  Name: Wende NeighborsFritz Katona MRN: [id number] Date of Birth: 1915/09/09  Clinical Social Work is seeking post-discharge placement for this patient at the Skilled  Nursing Facility level of care (*CSW will initial, date and re-position this form in  chart as items are completed):  Yes   Patient/family provided with [facility] Clinical Social Work Department's list of facilities offering this level of care within the geographic area requested by the patient (or if unable, by the patient's family).  Yes   Patient/family informed of their freedom to choose among providers that offer the needed level of care, that participate in Medicare, Medicaid or managed care program needed by the patient, have an available bed and are willing to accept the patient.  Yes   Patient/family informed of [facility]'s ownership interest in [facility] and [facility], as well as of the fact that they are under no obligation to receive care at these facilities.  PASRR submitted to EDS on       PASRR number received on       Existing PASRR number confirmed on       FL2 transmitted to all facilities in geographic area requested by pt/family on       FL2 transmitted to all facilities within larger geographic area on       Patient informed that his/her managed care company has contracts with or will negotiate with certain facilities, including the following:            Patient/family informed of bed offers received.  Patient chooses bed at       Physician recommends and patient chooses bed at      Patient to be transferred to   on  .  Patient to be transferred to facility by       Patient family notified on   of transfer.  Name of family member notified:        PHYSICIAN       Additional Comment:    _______________________________________________ Horton ChinGivens, Rosslyn Pasion A, Student-SW 07/24/2014, 3:01 PM

## 2014-07-24 NOTE — Plan of Care (Signed)
Problem: Consults Goal: Respiratory Problems Patient Education See Patient Education Module for education specifics.  Outcome: Not Progressing Patient confused

## 2014-07-25 DIAGNOSIS — J208 Acute bronchitis due to other specified organisms: Secondary | ICD-10-CM

## 2014-07-25 DIAGNOSIS — F329 Major depressive disorder, single episode, unspecified: Secondary | ICD-10-CM

## 2014-07-25 DIAGNOSIS — K219 Gastro-esophageal reflux disease without esophagitis: Secondary | ICD-10-CM

## 2014-07-25 MED ORDER — HALOPERIDOL LACTATE 2 MG/ML PO CONC
1.0000 mg | Freq: Four times a day (QID) | ORAL | Status: DC | PRN
  Filled 2014-07-25: qty 0.5

## 2014-07-25 MED ORDER — MORPHINE SULFATE (CONCENTRATE) 10 MG/0.5ML PO SOLN: 10.0000 mg | ORAL | Status: DC | PRN

## 2014-07-25 NOTE — Care Management Note (Signed)
CARE MANAGEMENT NOTE 07/25/2014  Patient:  John Weaver, John Weaver   Account Number:  [id number]  Date Initiated:  07/25/2014  Documentation initiated by:  Shawnise Peterkin  Subjective/Objective Assessment:   CM following for progression and d/c planning.     Action/Plan:   Met with pt and wife, plan now for home with Hospice services, [facility]. [facility] notified and planned d/c with pt wife, dme ordered. This CM provided list of private agencies of asssistance in the home.   Anticipated DC Date:  07/26/2014   Anticipated DC Plan:  HOME W HOSPICE CARE         Choice offered to / List presented to:  C-3 Spouse        HH arranged  HH-1 RN  HH-4 NURSE'S AIDE  HH-6 SOCIAL WORKER      HH agency  [facility]   Status of service:  Completed, signed off Medicare Important Message given?  YES (If response is "NO", the following Medicare IM given date fields will be blank) Date Medicare IM given:  07/25/2014 Medicare IM given by:  Fahed Morten Date Additional Medicare IM given:   Additional Medicare IM given by:    Discharge Disposition:  [facility]  Per UR Regulation:    If discussed at Long Length of Stay Meetings, dates discussed:    Comments:  07/25/2014 DME ordered for delivery to home by [facility], wife provided with list of agencies providing aides for assistance in the home and someone to day with the pt at night. Family will need to clear a room for DME to be delivered, will plan on delivery of DME tomorrow am and d/c of pt tomorrow after DME has arrived.  CRoyal RN MPH, case manager, [phone]

## 2014-07-25 NOTE — Progress Notes (Signed)
[name]  Wende NeighborsFritz Berrong WUJ:[id number] DOB: 1915-06-01 DOA: 07/22/2014 PCP: Margaree MackintoshBAXLEY,[name], MD  Assessment/Plan: 1-Shortness of breath: ongoing; still with significant wheezing/rhonchi, mild bibasilar crackles and difficulty speaking in full sentences. -Patient will continue Pulmicort, continue Levaquin for bronchitis/early pneumonia process; continue nebulizer therapy -Patient had a history of diastolic heart failure most likely associated with aortic valve stenosis; CXR revealing vascular congestion and bilat pleural effusion -will continue X1 more day tx with IV lasix BID now and follow daily weights and strict I's and O's -follow clinical response. -per discussion with PC and rectification of GOC, plan is for comfort measures and will be discharge home with hospice  2-acute on chronic anemia: With positive fecal occult blood test -Patient chronically on Plavix for stroke secondary prevention -Per GI recommendations no endoscopy at this moment -Patient has received 1 unit of blood and hemoglobin is now trending up -No signs of overt bleeding and is hemodynamically stable -Will follow trend -continue PPI and iron supplementation -Hgb 8.0  3-depression/anxiety: Continue Paxil -Mood is a stable and there is no signs of suicidal ideation  -patient is pleasantly confused   4-hypertension: BP is stable -will hold amlodipine -plan is for lasix to be given IV BID; will like to have room for diuresis -most likely home tomorrow with PO lasix to maintain volume controlled  5-hypothyroidism: synthroid discontinue as part of hospice approach  6-hyperlipidemia: after discussing with wife, statins will be discontinued, looking to treat conservately and minimizing medications.  7-BPH: Continue Flomax  8-history of stroke/TIA: No new neurologic deficit appreciated. -Plavix on hold secondary to positive fecal occult blood test and acute on chronic anemia.  Code Status:  discussed with wife, patient is DNR and no looking for any heroic intervention (no tube feedings, etc...) Family Communication:wife at bedside Disposition Plan: to be determine; per PT recommendations will needs SNF. Patient is very deconditioned and requiring a lot of assistance. Also with poor appetite; per palliaitve care discussion, will d/c home with hospice    Consultants:  GI (Dr. Dulce Sellarutlaw)  Procedures:  2-D echo - Left ventricle: The cavity size was normal. Wall thickness was normal. Systolic function was normal. The estimated ejection fraction was in the range of 60% to 65%. Wall motion was normal; there were no regional wall motion abnormalities. - Aortic valve: There was mild to moderate stenosis. There was trivial regurgitation. Valve area (VTI): 0.98 cm^2. Valve area (Vmax): 1.14 cm^2. Valve area (Vmean): 0.99 cm^2. - Mitral valve: Calcified annulus. There was mild regurgitation. - Right atrium: The atrium was mildly dilated. - Pulmonary arteries: Systolic pressure was moderately increased. PA peak pressure: 41 mm Hg (S).  Antibiotics:  Zithromax 4/19>>4/20  Levaquin 4/20  HPI/Subjective: No fever. No CP. Feeling a lot better. Per discussion with palliative care, plan is to go home with hospice and transition slowly to comfort care if needed. Plan is for home with hospice.   Objective: Filed Vitals:   07/25/14 2150  BP: 111/55  Pulse: 94  Temp: 98.5 F (36.9 C)  Resp: 17    Intake/Output Summary (Last 24 hours) at 07/25/14 2324 Last data filed at 07/25/14 2210  Gross per 24 hour  Intake    360 ml  Output   2150 ml  Net  -1790 ml   Filed Weights   07/23/14 2100 07/24/14 2100 07/25/14 2150  Weight: 61.4 kg (135 lb 5.8 oz) 64.4 kg (141 lb 15.6 oz) 64.289 kg (141 lb 11.7 oz)    Exam:  General:  Afebrile, denying chest pain, and continue to have improvement in his breathing; Good oxygen saturation on room air. Per wife, patient started to  look back to himself. Very hard of hearing  Cardiovascular: positive systolic murmur, regular rate, no rubs, no gallops; no JVD appreciated on exam. Mild JVD  Respiratory: respiratory rate of 16 during my examination; mild expiratory wheezing and improved air movement and just fine diffuse crackles/rhonchi bilaterally  Abdomen: soft, nontender, nondistended, positive bowel sounds  Musculoskeletal: trace lower extremity edema bilaterally, no cyanosis or clubbing  Data Reviewed: Basic Metabolic Panel:  Recent Labs Lab 07/22/14 0942 07/22/14 1203 07/23/14 0527  NA 138  --  138  K 4.6  --  4.2  CL 108  --  107  CO2 23  --  21  GLUCOSE 109*  --  94  BUN 27*  --  24*  CREATININE 1.52* 1.49* 1.47*  CALCIUM 8.8  --  8.9  MG  --  2.2  --    Liver Function Tests:  Recent Labs Lab 07/22/14 0942 07/23/14 0527  AST 43* 37  ALT 29 26  ALKPHOS 238* 219*  BILITOT 0.4 1.0  PROT 5.7* 5.8*  ALBUMIN 2.8* 3.0*   CBC:  Recent Labs Lab 07/22/14 0942 07/22/14 1203 07/23/14 0527  WBC 11.0* 10.4 9.0  NEUTROABS 9.1*  --   --   HGB 7.0* 7.4* 8.0*  HCT 21.4* 22.5* 24.2*  MCV 99.1 99.1 97.6  PLT 277 268 272   Cardiac Enzymes:  Recent Labs Lab 07/22/14 1203 07/22/14 1943 07/22/14 2337  TROPONINI <0.03 0.03 <0.03   BNP (last 3 results)  Recent Labs  07/22/14 0942  BNP 574.0*    ProBNP (last 3 results)  Recent Labs  09/16/13 1315  PROBNP 1016.0*   Studies: Dg Chest 2 View  07/24/2014   CLINICAL DATA:  Shortness of Breath  EXAM: CHEST  2 VIEW  COMPARISON:  07/22/2014  FINDINGS: Cardiac shadow is stable. A pacing device is again identified. Small bilateral pleural effusions are noted but appear improved on the frontal exam when compared with the prior study. No new focal abnormality is seen. Degenerative changes of the left shoulder joint are again seen  IMPRESSION: Bilateral pleural effusions although the overall appearance has improved on the frontal film when compared  with the prior exam. Continued followup is recommended.   Electronically Signed   By: Alcide Clever M.D.   On: 07/24/2014 09:45    Scheduled Meds: . sodium chloride  10 mL/hr Intravenous Once  . budesonide (PULMICORT) nebulizer solution  0.25 mg Nebulization BID  . furosemide  20 mg Intravenous BID  . levalbuterol  0.63 mg Nebulization Q6H  . pantoprazole  40 mg Oral BID  . PARoxetine  10 mg Oral Daily  . sodium chloride  3 mL Intravenous Q12H  . tamsulosin  0.4 mg Oral Daily   Continuous Infusions:   Active Problems:   Acute bronchitis   Anemia of chronic disease    Time spent: 30 minutes    Vassie Loll  [facility] Pager [phone]. If 7PM-7AM, please contact night-coverage at [email or web address], password [facility] 07/25/2014, 11:24 PM  LOS: 3 days

## 2014-07-25 NOTE — Clinical Social Work Note (Signed)
CSW talked with patient's wife earlier today regarding SNF placement and she was interested in a skilled facility as she was concerned about caring for patient at home. Patient's wife [facility] and admissions director contacted for this information. CSW learned later today that patient will discharge [facility]. Patient may need ambulance transport home.  Out-of-Facility DNR in patient's chart needing MD signature.  Genelle BalVanessa Tamani Durney, MSW, LCSW Licensed Clinical Social Worker Clinical Social Work Department [facility] [phone]

## 2014-07-25 NOTE — Progress Notes (Addendum)
Notified by Elnita Maxwellheryl, [facility] of family request [facility] at [facility] after discharge.  Writer spoke with wife at bedside to initiate education related to hospice philosophy, services and team approach to care. Wife voiced understanding of information provided. Per discussion plan is [facility].  Please send signed and completed DNR form home with family. Pt. willl need prescriptions for discharge comfort medications. Wife is requesting simplified medication regimen.  DME needs discussed and wife requests to have DME delivered to the home Sat. 4/23 between  12-4p. [facility] equipment manager Jewel WallerHughes notified,  and will contact [facility] for delivery of bed with 1/2 rails, APP, table, transport chair and nebulizer. The home address has been verified and pt's wife will be called for delivery time.  [facility]erral [facility] aware of the above. Please call [facility] at [phone] when pt. discharges from the unit. [facility] information and contact information have been given to pt's wife during visit. Above information shared with Elnita MaxwellCheryl, [facility]  Sharen HeckLisa Strandberg RN [facility] Hospital Liaison [phone]

## 2014-07-25 NOTE — Consult Note (Signed)
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 Over the past year he has had a dramatic decline-and his wife reports "all of the bad things he never wanted to happen to him have happened and it is just so sad" She explains that his vision is poor so he can no longer paint, he has lost hearing so he can no longer enjoy music, he is to weak to care for himself and as a dignified and proud man this has been devastating for him. He has worsening cognitive dysfunction and issues with delirium and sundowning. He was discharged from [facility] on 4/18 after no longer meeting "rehab" criteria for medicare. That evening he started vomiting and had profuse diarrhea and became combative when his wife tried to help him get cleaned up and comfortable. He returned to the ED since they had no other recourse and she could not manage his care at home.   Dr. Gwenlyn Perking appropriately discussed goals of care and suggested palliative consultation and a transition to comfort care. After I spoke with his wife it was extremely clear that medical interventions had likely gone beyond what would have been within his goals of care and wishes. At this point their goal is FULL COMFORT CARE. No interventions to prolong his life in his current state of health including no antibiotics-the only medications he should receive are those related to comfort and we should allow for a natural and peaceful death to occur in line with his documented and stated  wishes.   Palliative Care Discussion Held Today Primary Decision Maker: John Weaver  HCPOA: yes  Spoke with patient's wife extensively over the phone.  Code Status/Advance Care Planning:  DNR  Patient has Living Will and DNR prior to admission but they are not scanned into our system.  Symptom Management:   PRN Roxanol for pain and dyspnea  PRN Ativan for sleep and sedation   PRN Haldol for agitation and also for nausea  Palliative Prophylaxis: Aspiration Prevention, Delirium Prevention  Continue to diurese as tolerated for comfort and dyspnea in setting of possible CHF  Psycho-social/Spiritual:   Support System: Lives with his wife John Weaver who is 62 years old in a condo in [place]- no other family involved.  Desire for further Chaplaincy support:no  Prognosis: < 6 months  Discharge Planning:  Long discussion-he would very appropriate for hospice referral given goals and his declining condition. John Weaver is wiling to try 24/7 care givers in addition to hospice at home at least initially- and feels she could handle this for a short time to honor his dignity and to enhance his comfort to be at hiome for EOL- she is also contemplating SNF private pay with full hospice but is concerned about teh quality of care that could be provided in long term care. For now she would like a referral mede to hospice for home care and I encouraged her to look at a few LTC options to see if that is a better direction for them. No interest in rehab -  full comfort care approach.       Time : 5:30PM-6:15PM  Time Total: 45 minutes  Greater than 50%  of this time was spent counseling and coordinating care related to the above assessment and plan.  Signed by: Hilbert OdorGOLDING,John Leoni, DO  Billee Balcerzak L Lakea Mittelman, DO  07/25/2014, 12:56 AM  Please contact Palliative Medicine Team phone at [phone] for questions and concerns.

## 2014-07-26 DIAGNOSIS — R0602 Shortness of breath: Secondary | ICD-10-CM | POA: Insufficient documentation

## 2014-07-26 DIAGNOSIS — F0150 Vascular dementia without behavioral disturbance: Secondary | ICD-10-CM | POA: Insufficient documentation

## 2014-07-26 DIAGNOSIS — K922 Gastrointestinal hemorrhage, unspecified: Secondary | ICD-10-CM | POA: Insufficient documentation

## 2014-07-26 DIAGNOSIS — I5033 Acute on chronic diastolic (congestive) heart failure: Secondary | ICD-10-CM | POA: Insufficient documentation

## 2014-07-26 LAB — RESPIRATORY VIRUS PANEL
Adenovirus: NEGATIVE
INFLUENZA B 1: NEGATIVE
Influenza A: NEGATIVE
Metapneumovirus: NEGATIVE
PARAINFLUENZA 1 A: NEGATIVE
Parainfluenza 2: NEGATIVE
Parainfluenza 3: NEGATIVE
RESPIRATORY SYNCYTIAL VIRUS A: NEGATIVE
Respiratory Syncytial Virus B: NEGATIVE
Rhinovirus: POSITIVE — AB

## 2014-07-26 MED ORDER — FUROSEMIDE 20 MG PO TABS: 40.0000 mg | ORAL_TABLET | Freq: Every day | ORAL | Status: AC

## 2014-07-26 MED ORDER — LEVALBUTEROL HCL 0.63 MG/3ML IN NEBU: 0.6300 mg | INHALATION_SOLUTION | Freq: Two times a day (BID) | RESPIRATORY_TRACT | Status: DC

## 2014-07-26 MED ORDER — BUDESONIDE 0.25 MG/2ML IN SUSP: 0.2500 mg | Freq: Two times a day (BID) | RESPIRATORY_TRACT | Status: AC

## 2014-07-26 MED ORDER — POTASSIUM CHLORIDE ER 10 MEQ PO TBCR: 10.0000 meq | EXTENDED_RELEASE_TABLET | Freq: Every day | ORAL | Status: AC

## 2014-07-26 MED ORDER — MORPHINE SULFATE (CONCENTRATE) 10 MG/0.5ML PO SOLN: 10.0000 mg | ORAL | Status: AC | PRN

## 2014-07-26 MED ORDER — LEVALBUTEROL HCL 0.63 MG/3ML IN NEBU: 0.6300 mg | INHALATION_SOLUTION | Freq: Three times a day (TID) | RESPIRATORY_TRACT | Status: AC | PRN

## 2014-07-26 NOTE — Care Management Note (Signed)
    Page 1 of 2   07/26/2014     3:05:03 PM CARE MANAGEMENT NOTE 07/26/2014  Patient:  John Weaver, John Weaver   Account Number:  [id number]  Date Initiated:  07/25/2014  Documentation initiated by:  Jasmine Pang  Subjective/Objective Assessment:   CM following for progression and d/c planning.     Action/Plan:   Met with pt and wife, plan now for home with Hospice services, wife selected [facility] [place]. [facility] notified and planned d/c with pt wife, dme ordered. This CM provided list of private agencies of asssistance in the home.   Anticipated DC Date:  07/26/2014   Anticipated DC Plan:  [place]  CM consult      Choice offered to / List presented to:  C-3 Spouse        HH arranged  HH-1 RN  HH-4 NURSE'S AIDE  HH-6 SOCIAL WORKER      HH agency  [facility]   Status of service:  Completed, signed off Medicare Important Message given?  YES (If response is "NO", the following Medicare IM given date fields will be blank) Date Medicare IM given:  07/25/2014 Medicare IM given by:  ROYAL,CHERYL Date Additional Medicare IM given:   Additional Medicare IM given by:    Discharge Disposition:  [place]  Per UR Regulation:    If discussed at Long Length of Stay Meetings, dates discussed:    Comments:  07/26/14 14:55 CM called wife of pt, Carmie Kanner [phone] who states DME has not been delivered as of yet.  Ms. Linus Mako states [facility] will be admitting tomorrow morning at 11:00 and as soon DME is delivered to home today, she will call [facility] to arrange for ambulance transport home.  CM called [facility] to give them notice of pending discharge and need for DME delivery verification prior to ambulance set up - and Gold DNR is on front of chart.  No other CM needs were communicated.  Mariane Masters, BSN, [place].    07/25/2014 DME ordered for delivery to home by [facility] [place], wife provided with list of agencies providing aides for assistance in the home and someone to day with the pt at night. Family will need to clear a room for DME to be delivered, will plan on delivery of DME tomorrow am and d/c of pt tomorrow after DME has arrived.  CRoyal RN MPH, case manager, [phone]

## 2014-07-26 NOTE — Discharge Summary (Signed)
Physician Discharge Summary  John Weaver ZOX:[id number] DOB: December 31, 1915 DOA: 07/22/2014  PCP: Margaree Mackintosh, MD  Admit date: 07/22/2014 Discharge date: 07/26/2014  Time spent: >30 minutes  Recommendations for Outpatient Follow-up:  1. Plan is for full comfort care/symptomatic management  Discharge Diagnoses:  Acute bronchitis Anemia of chronic disease Acute on chronic diastolic heart failure Positive fecal occult blood test  Physical deconditioning Presumed vascular dementia, with delirium and sundowning History of stroke Hyperlipidemia Hypothyroidism Hypertension Depression/anxiety  Discharge Condition: Stable and improved. Patient has been discharged home with hospice care.   Diet recommendation: Comfort feeding (but watching increased amount of sodium intake)  Filed Weights   07/23/14 2100 07/24/14 2100 07/25/14 2150  Weight: 61.4 kg (135 lb 5.8 oz) 64.4 kg (141 lb 15.6 oz) 64.289 kg (141 lb 11.7 oz)    History of present illness:  81 y.o. male hx of HTN, HL, CHF here with shortness of breath, wheezing, gasping for air, confused with hallucinations., Also complained of nausea and vomiting but no diarrhea.. He recently had pelvic fractures from a fall and was admitted and went to rehabilitation for the last month. He started wheezing and coughing since yesterday. He was discharged from rehabilitation yesterday went home. He was unable to sleep yesterday because he was coughing so much. Also has been more confused since yesterday. Denies any fevers or chills or vomiting. Had some dark vomit this AM. No melena. On rectal exam, dark brown stool. Patient was FOBT positive  Hospital Course:  1-Shortness of breath: Significantly improve at the moment of discharge. Appears to be most likely secondary to a combination of acute bronchitis, acute on chronic diastolic heart failure with concomitant COPD.  -Patient will be continue Pulmicort and continue xopenex -received 5 days of  levaquin for potential bronchitis/bronchiectasis -pretty well diuresed and at discharge will use lasix 20mg  daily for volume control -plan is to also use PRN morphine for SOB, pain and comfort -per discussion with Palliative Care and rectification of GOC with his wife, plan is for comfort measures and symptomatic management. Will discharge home with hospice.  2-acute on chronic anemia: With positive fecal occult blood test -Patient chronically on Plavix for stroke secondary prevention -Per GI recommendations no endoscopy candidate; recommended use of PPI and stopping plavix -Patient received 1 unit of blood and hemoglobin was trending up -No signs of overt bleeding and remained hemodynamically stable -plan of care is comfort and symptomatic management  3-depression/anxiety: Continue Paxil -Mood is a stable and there is no signs of suicidal ideation  -patient is pleasantly confused  -Continue supportive care  4-hypertension: BP is stable -will discontinue amlodipine -Patient will be on Lasix to control volume and blood pressure. -Plan is for him to local over sodium intake to help maximization of treatment.  5-hypothyroidism: synthroid discontinue as part of full comfort/symptomatic management.  6-hyperlipidemia: after discussing with wife, statins will be discontinued, looking to treat conservately and minimizing medications. -Main goal now is for full comfort care and symptomatic management  7-BPH: Continue Flomax  8-history of stroke: No new focal deficit appreciated. -Concerns for vascular dementia -After discussing with patient's wife and health care power of attorney; decision has been made for full home for/symptomatic management and not to pursue any further testing.  Procedures:  See below for x-ray reports  Consultations:  Palliative care consult (Dr. Phillips Odor): Consultation was made for disposition, establishing goals of care and advanced directives.  Discharge  Exam: Filed Vitals:   07/26/14 0507  BP: 110/52  Pulse:  86  Temp: 98.3 F (36.8 C)  Resp: 17    General: Afebrile, denying chest pain, and breathing a whole lot better. Patient was intermittently oriented X2, able to follow commands. Good oxygen saturation on room air.Very hard of hearing  Cardiovascular: positive systolic murmur, regular rate, no rubs, no gallops; no JVD appreciated on exam.   Respiratory: respiratory rate of 16 during my examination; no use of accessory muscles; Very mild expiratory wheezing and improved air movement; no crackles appreciated on exam. Patient with scattered rhonchi.   Abdomen: soft, nontender, nondistended, positive bowel sounds  Musculoskeletal: trace lower extremity edema bilaterally, no cyanosis or clubbing   Discharge Instructions   Discharge Instructions    Diet - low sodium heart healthy    Complete by:  As directed      Discharge instructions    Complete by:  As directed   Plan is for patient to be discharge home with hospice care  Comfort feeding, but watching for large amount of sodium intake Follow up with PCP in outpatient setting in 1 week          Current Discharge Medication List    START taking these medications   Details  budesonide (PULMICORT) 0.25 MG/2ML nebulizer solution Take 2 mLs (0.25 mg total) by nebulization 2 (two) times daily. Qty: 60 mL, Refills: 12    furosemide (LASIX) 20 MG tablet Take 2 tablets (40 mg total) by mouth daily. Qty: 30 tablet, Refills: 1    levalbuterol (XOPENEX) 0.63 MG/3ML nebulizer solution Take 3 mLs (0.63 mg total) by nebulization every 8 (eight) hours as needed for wheezing or shortness of breath. Qty: 3 mL, Refills: 12    Morphine Sulfate (MORPHINE CONCENTRATE) 10 MG/0.5ML SOLN concentrated solution Take 0.5 mLs (10 mg total) by mouth every 2 (two) hours as needed for moderate pain, severe pain, anxiety or shortness of breath. Qty: 30 mL, Refills: 0    potassium chloride (K-DUR)  10 MEQ tablet Take 1 tablet (10 mEq total) by mouth daily. Qty: 30 tablet, Refills: 1      CONTINUE these medications which have NOT CHANGED   Details  Ascorbic Acid (VITAMIN C) 1000 MG tablet Take 1,000 mg by mouth daily.    Diphenhydramine-APAP, sleep, (TYLENOL PM EXTRA STRENGTH PO) Take 1 tablet by mouth at bedtime as needed (for sleep).     omeprazole (PRILOSEC) 20 MG capsule Take 1 capsule (20 mg total) by mouth daily. Qty: 90 capsule, Refills: 1    PARoxetine (PAXIL) 10 MG tablet Take 1 tablet (10 mg total) by mouth daily. Qty: 30 tablet, Refills: 11    tamsulosin (FLOMAX) 0.4 MG CAPS capsule TAKE ONE CAPSULE BY MOUTH DAILY Qty: 30 capsule, Refills: 5      STOP taking these medications     amLODipine (NORVASC) 5 MG tablet      clopidogrel (PLAVIX) 75 MG tablet      HYDROcodone-acetaminophen (NORCO/VICODIN) 5-325 MG per tablet      levothyroxine (SYNTHROID, LEVOTHROID) 100 MCG tablet      Multiple Vitamins-Minerals (MULTIVITAMINS THER. W/MINERALS) TABS      pravastatin (PRAVACHOL) 40 MG tablet      triamcinolone cream (KENALOG) 0.1 %        Allergies  Allergen Reactions  . Sulfa Antibiotics Rash   Follow-up Information    Follow up with Margaree MackintoshBAXLEY,[name], MD. Schedule an appointment as soon as possible for a visit in 1 week.   Specialty:  Internal Medicine   Contact information:  [address] [place] [address] [phone]       The results of significant diagnostics from this hospitalization (including imaging, microbiology, ancillary and laboratory) are listed below for reference.    Significant Diagnostic Studies: Dg Chest 2 View  07/24/2014   CLINICAL DATA:  Shortness of Breath  EXAM: CHEST  2 VIEW  COMPARISON:  07/22/2014  FINDINGS: Cardiac shadow is stable. A pacing device is again identified. Small bilateral pleural effusions are noted but appear improved on the frontal exam when compared with the prior study. No new focal abnormality is  seen. Degenerative changes of the left shoulder joint are again seen  IMPRESSION: Bilateral pleural effusions although the overall appearance has improved on the frontal film when compared with the prior exam. Continued followup is recommended.   Electronically Signed   By: Alcide Clever M.D.   On: 07/24/2014 09:45   Dg Chest Port 1 View  07/22/2014   CLINICAL DATA:  Shortness of breath and wheezing for a few days, history hypertension, CHF, GERD  EXAM: PORTABLE CHEST - 1 VIEW  COMPARISON:  Portable exam 0930 hours compared to 06/06/2014  FINDINGS: LEFT subclavian sequential pacemaker leads project over RIGHT atrium and RIGHT ventricle.  Upper normal size of cardiac silhouette with minimal vascular congestion.  Mediastinal contours and pulmonary vascularity normal.  Atherosclerotic calcification aorta.  Slight rotation to the LEFT.  Minimal bibasilar atelectasis.  Lungs otherwise clear.  No pleural effusion or pneumothorax.  Bones demineralized with thoracic scoliosis.  Advanced LEFT glenohumeral degenerative changes.  IMPRESSION: Minimal bibasilar atelectasis.   Electronically Signed   By: Ulyses Southward M.D.   On: 07/22/2014 09:41   Dg Abd Portable 1v  07/22/2014   CLINICAL DATA:  GI bleed.  Fluid like symptoms  EXAM: PORTABLE ABDOMEN - 1 VIEW  COMPARISON:  CT abdomen pelvis 07/27/2006  FINDINGS: Moderate to severe levoscoliosis of the lumbar spine with multilevel degenerative change.  Fracture of the right superior inferior pubic rami. Avulsion fracture of the ischemic tuberosity. These may be subacute. Question history of trauma. Tumor a consideration but considered less likely.  Nonobstructive bowel gas pattern.  6 mm left renal calculus.  IMPRESSION: Fractures of the right superior and inferior pubic rami, question history trauma. CT may be helpful for further evaluation.  Nonobstructive bowel gas pattern  Left renal calculus.   Electronically Signed   By: Marlan Palau M.D.   On: 07/22/2014 14:53    Labs: Basic Metabolic Panel:  Recent Labs Lab 07/22/14 0942 07/22/14 1203 07/23/14 0527  NA 138  --  138  K 4.6  --  4.2  CL 108  --  107  CO2 23  --  21  GLUCOSE 109*  --  94  BUN 27*  --  24*  CREATININE 1.52* 1.49* 1.47*  CALCIUM 8.8  --  8.9  MG  --  2.2  --    Liver Function Tests:  Recent Labs Lab 07/22/14 0942 07/23/14 0527  AST 43* 37  ALT 29 26  ALKPHOS 238* 219*  BILITOT 0.4 1.0  PROT 5.7* 5.8*  ALBUMIN 2.8* 3.0*   CBC:  Recent Labs Lab 07/22/14 0942 07/22/14 1203 07/23/14 0527  WBC 11.0* 10.4 9.0  NEUTROABS 9.1*  --   --   HGB 7.0* 7.4* 8.0*  HCT 21.4* 22.5* 24.2*  MCV 99.1 99.1 97.6  PLT 277 268 272   Cardiac Enzymes:  Recent Labs Lab 07/22/14 1203 07/22/14 1943 07/22/14 2337  TROPONINI <0.03 0.03 <  0.03   BNP: BNP (last 3 results)  Recent Labs  07/22/14 0942  BNP 574.0*    ProBNP (last 3 results)  Recent Labs  09/16/13 1315  PROBNP 1016.0*   Signed:  Vassie Loll  [facility] 07/26/2014, 3:16 PM

## 2014-08-02 ENCOUNTER — Telehealth: Payer: Self-pay | Admitting: Internal Medicine

## 2014-08-03 NOTE — Telephone Encounter (Signed)
Wife called to say that patient passed away peacefully at home. Hospice nurse has been called. I will go to home now to pronounce patient.

## 2014-08-03 DEATH — deceased

## 2014-08-04 ENCOUNTER — Ambulatory Visit: Payer: Medicare Other | Admitting: Internal Medicine

## 2014-08-28 ENCOUNTER — Encounter: Payer: Self-pay | Admitting: Cardiology

## 2016-02-20 IMAGING — CR DG ABD PORTABLE 1V
1 series · 1 of 1 positions shown · non-contrast
Comparison: CT abdomen pelvis 07/27/2006

CLINICAL DATA: GI bleed.  Fluid like symptoms

EXAM:
PORTABLE ABDOMEN - 1 VIEW

[AP]
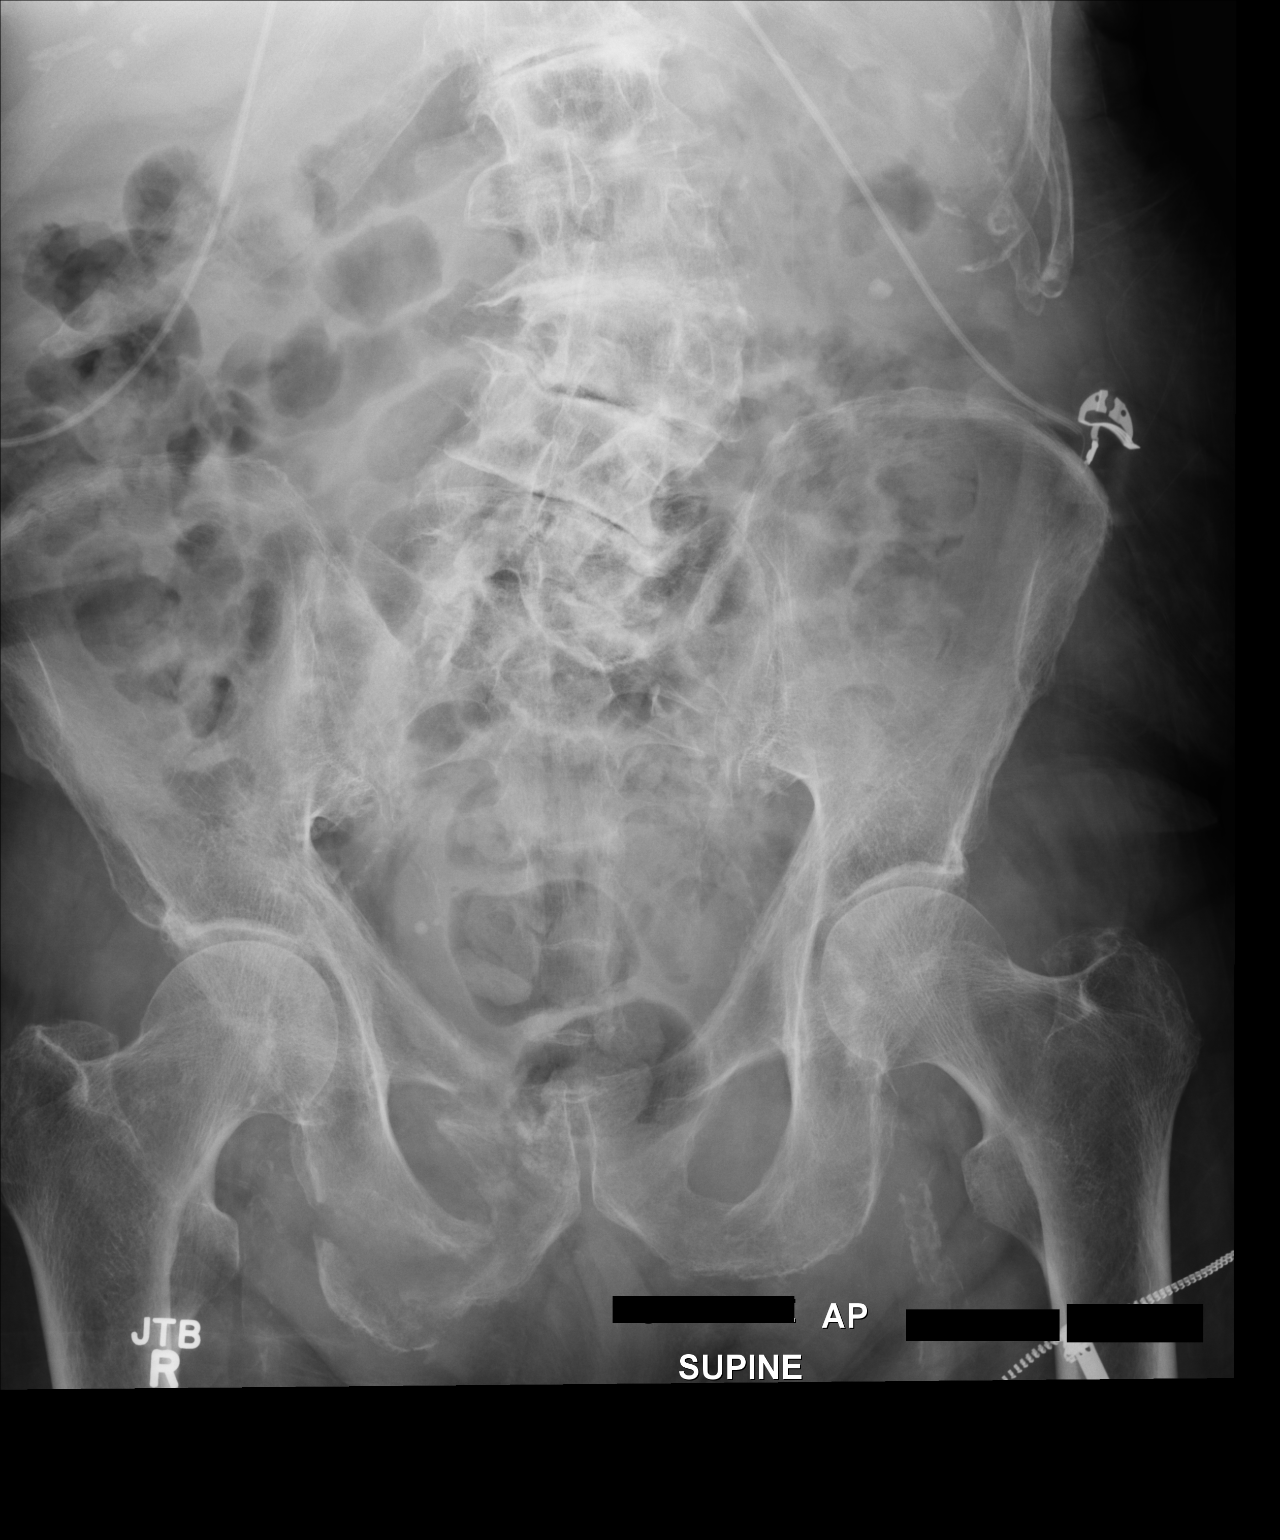

[1 of 1 positions shown; findings below may reference images not displayed]

FINDINGS: Moderate to severe levoscoliosis of the lumbar spine with multilevel
degenerative change.

Fracture of the right superior inferior pubic rami. Avulsion
fracture of the ischemic tuberosity. These may be subacute. Question
history of trauma. Tumor a consideration but considered less likely.

Nonobstructive bowel gas pattern.  6 mm left renal calculus.
IMPRESSION: Fractures of the right superior and inferior pubic rami, question
history trauma. CT may be helpful for further evaluation.

Nonobstructive bowel gas pattern

Left renal calculus.

## 2016-02-20 IMAGING — CR DG CHEST 1V PORT
1 series · 1 of 1 positions shown · non-contrast
Comparison: Portable exam 6206 hours compared to 06/06/2014

CLINICAL DATA: Shortness of breath and wheezing for a few days,
history hypertension, CHF, GERD

EXAM:
PORTABLE CHEST - 1 VIEW

[AP]
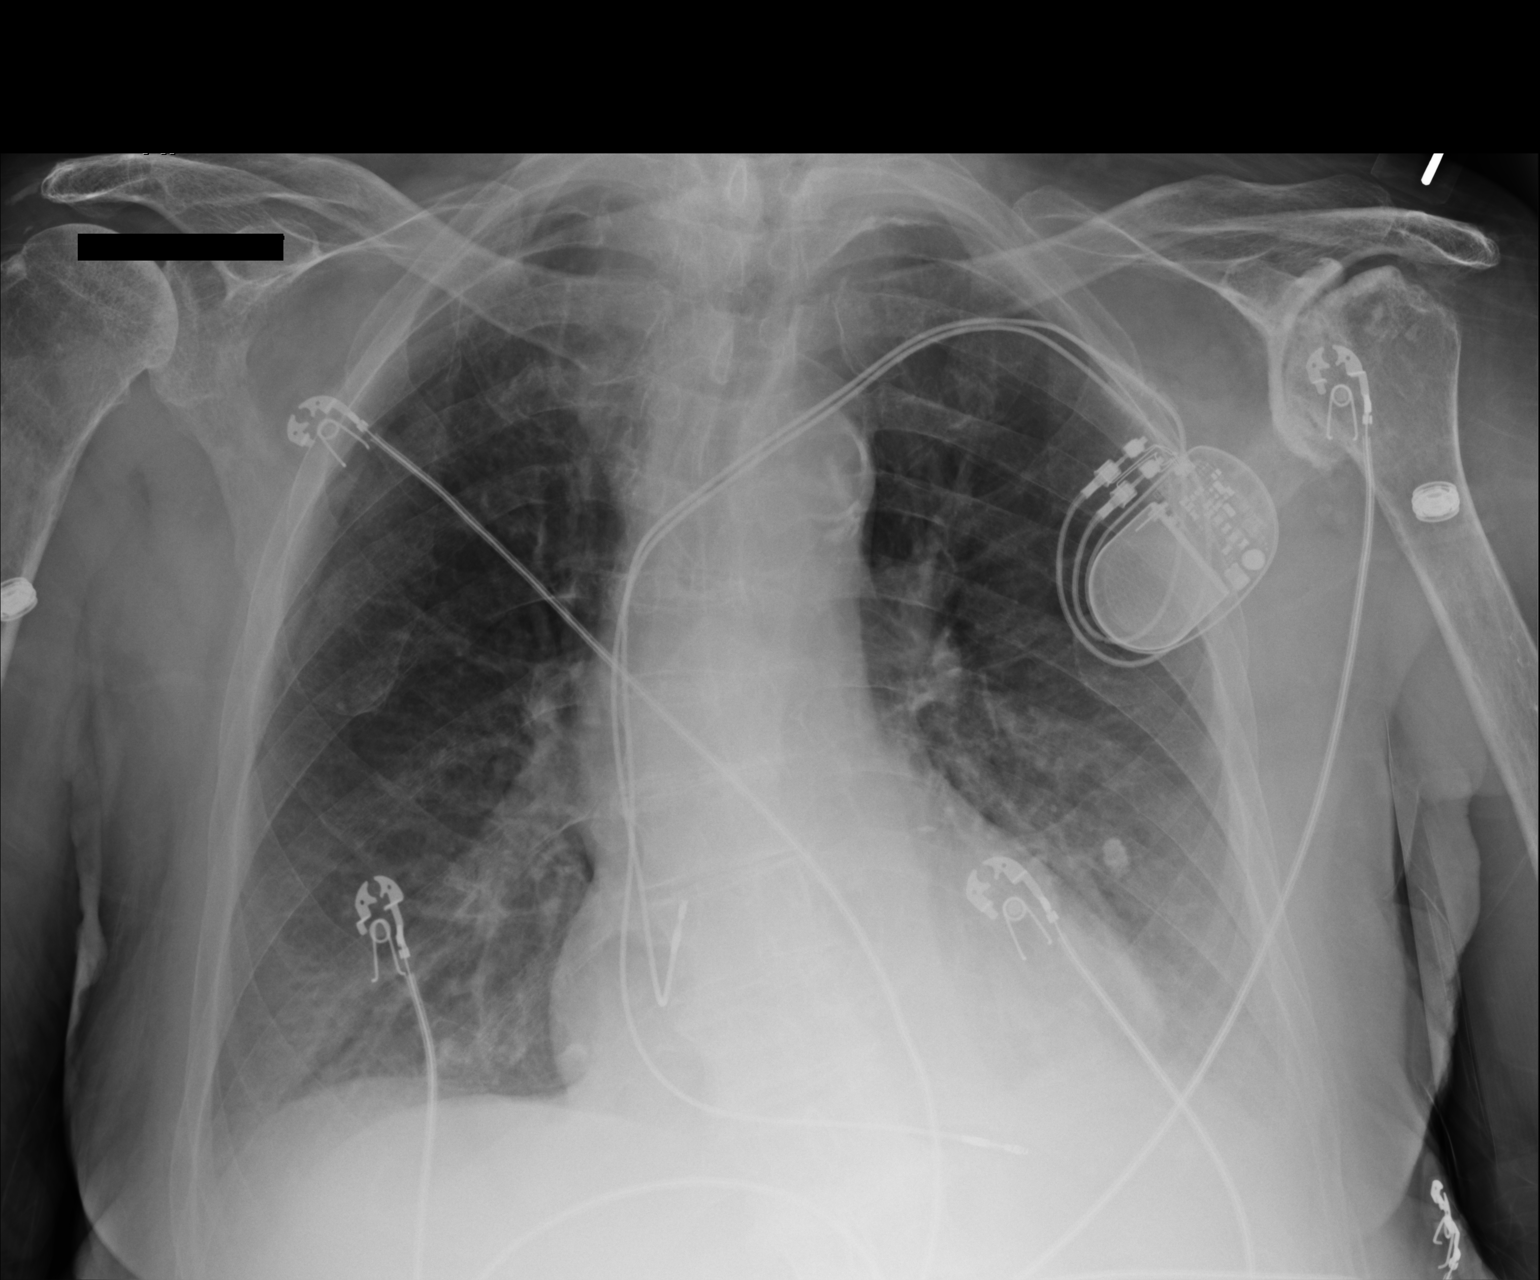

[1 of 1 positions shown; findings below may reference images not displayed]

FINDINGS: LEFT subclavian sequential pacemaker leads project over RIGHT atrium
and RIGHT ventricle.

Upper normal size of cardiac silhouette with minimal vascular
congestion.

Mediastinal contours and pulmonary vascularity normal.

Atherosclerotic calcification aorta.

Slight rotation to the LEFT.

Minimal bibasilar atelectasis.

Lungs otherwise clear.

No pleural effusion or pneumothorax.

Bones demineralized with thoracic scoliosis.

Advanced LEFT glenohumeral degenerative changes.
IMPRESSION: Minimal bibasilar atelectasis.
